# Patient Record
Sex: Male | Born: 1935 | Race: White | Hispanic: No | Marital: Married | Smoking: Never smoker
Health system: Southern US, Community
[De-identification: ages and names within clinical notes are randomized; demographics above are authoritative.]

## PROBLEM LIST (undated history)

## (undated) DIAGNOSIS — N200 Calculus of kidney: Secondary | ICD-10-CM

## (undated) DIAGNOSIS — E782 Mixed hyperlipidemia: Secondary | ICD-10-CM

## (undated) DIAGNOSIS — N400 Enlarged prostate without lower urinary tract symptoms: Secondary | ICD-10-CM

## (undated) DIAGNOSIS — I2510 Atherosclerotic heart disease of native coronary artery without angina pectoris: Secondary | ICD-10-CM

## (undated) DIAGNOSIS — R911 Solitary pulmonary nodule: Secondary | ICD-10-CM

## (undated) DIAGNOSIS — Z87442 Personal history of urinary calculi: Secondary | ICD-10-CM

## (undated) DIAGNOSIS — J309 Allergic rhinitis, unspecified: Secondary | ICD-10-CM

## (undated) DIAGNOSIS — K219 Gastro-esophageal reflux disease without esophagitis: Secondary | ICD-10-CM

## (undated) DIAGNOSIS — C4490 Unspecified malignant neoplasm of skin, unspecified: Secondary | ICD-10-CM

## (undated) DIAGNOSIS — I10 Essential (primary) hypertension: Secondary | ICD-10-CM

## (undated) DIAGNOSIS — D649 Anemia, unspecified: Secondary | ICD-10-CM

## (undated) HISTORY — DX: Allergic rhinitis, unspecified: J30.9

## (undated) HISTORY — PX: BACK SURGERY: SHX140

## (undated) HISTORY — DX: Mixed hyperlipidemia: E78.2

## (undated) HISTORY — DX: Anemia, unspecified: D64.9

## (undated) HISTORY — PX: TOTAL SHOULDER REPLACEMENT: SUR1217

## (undated) HISTORY — DX: Personal history of urinary calculi: Z87.442

## (undated) HISTORY — DX: Essential (primary) hypertension: I10

## (undated) HISTORY — PX: CORONARY ANGIOPLASTY WITH STENT PLACEMENT: SHX49

## (undated) HISTORY — PX: MOHS SURGERY: SHX181

## (undated) HISTORY — PX: APPENDECTOMY: SHX54

## (undated) HISTORY — DX: Solitary pulmonary nodule: R91.1

## (undated) HISTORY — PX: JOINT REPLACEMENT: SHX530

---

## 1998-09-14 ENCOUNTER — Other Ambulatory Visit: Admission: RE | Admit: 1998-09-14 | Discharge: 1998-09-14 | Payer: Self-pay | Admitting: *Deleted

## 1998-11-25 ENCOUNTER — Emergency Department (HOSPITAL_COMMUNITY): Admission: EM | Admit: 1998-11-25 | Discharge: 1998-11-25 | Payer: Self-pay | Admitting: Endocrinology

## 1998-11-25 ENCOUNTER — Encounter: Payer: Self-pay | Admitting: Endocrinology

## 2001-04-11 ENCOUNTER — Encounter: Admission: RE | Admit: 2001-04-11 | Discharge: 2001-07-10 | Payer: Self-pay | Admitting: *Deleted

## 2009-02-16 ENCOUNTER — Encounter: Admission: RE | Admit: 2009-02-16 | Discharge: 2009-04-08 | Payer: Self-pay | Admitting: Orthopedic Surgery

## 2009-04-11 ENCOUNTER — Encounter: Admission: RE | Admit: 2009-04-11 | Discharge: 2009-05-25 | Payer: Self-pay | Admitting: Orthopedic Surgery

## 2009-05-24 ENCOUNTER — Encounter: Admission: RE | Admit: 2009-05-24 | Discharge: 2009-05-24 | Payer: Self-pay | Admitting: Orthopedic Surgery

## 2009-09-04 ENCOUNTER — Emergency Department (HOSPITAL_BASED_OUTPATIENT_CLINIC_OR_DEPARTMENT_OTHER): Admission: EM | Admit: 2009-09-04 | Discharge: 2009-09-04 | Payer: Self-pay | Admitting: Emergency Medicine

## 2009-09-04 ENCOUNTER — Encounter: Payer: Self-pay | Admitting: Pulmonary Disease

## 2009-09-04 ENCOUNTER — Ambulatory Visit: Payer: Self-pay | Admitting: Diagnostic Radiology

## 2009-09-08 ENCOUNTER — Ambulatory Visit (HOSPITAL_COMMUNITY): Admission: RE | Admit: 2009-09-08 | Discharge: 2009-09-08 | Payer: Self-pay | Admitting: Urology

## 2009-10-17 ENCOUNTER — Ambulatory Visit: Payer: Self-pay | Admitting: Pulmonary Disease

## 2009-10-17 DIAGNOSIS — E119 Type 2 diabetes mellitus without complications: Secondary | ICD-10-CM | POA: Insufficient documentation

## 2009-10-17 DIAGNOSIS — J984 Other disorders of lung: Secondary | ICD-10-CM | POA: Insufficient documentation

## 2009-10-17 DIAGNOSIS — J309 Allergic rhinitis, unspecified: Secondary | ICD-10-CM | POA: Insufficient documentation

## 2009-10-20 ENCOUNTER — Ambulatory Visit: Payer: Self-pay | Admitting: Internal Medicine

## 2009-10-20 ENCOUNTER — Encounter: Payer: Self-pay | Admitting: Pulmonary Disease

## 2010-03-02 ENCOUNTER — Observation Stay (HOSPITAL_COMMUNITY): Admission: RE | Admit: 2010-03-02 | Discharge: 2010-03-03 | Payer: Self-pay | Admitting: Orthopedic Surgery

## 2010-07-11 NOTE — Assessment & Plan Note (Signed)
Summary: consult for pulmonary nodule.   Primary Provider/Referring Provider:  Marinda Garza  CC:  Pulmonary Consult.  History of Present Illness: the pt is a 77y/o male who comes in today for evaluation of a pulmonary nodule.  He recently underwent a ct a/p for flank pain, and incidentally was found to have a 5mm RLL nodule on the cuts thru the lung bases.  The pt has never smoked, and denies unexplained weight loss and anorexia.  He denies all pulmonary symptoms.  He has never lived in [place] or [place], and has no h/o TB exposure or +ppd.  He has no personal h/o cancer other than nonmelanoma skin cancer, and his health maintenance is up to date by his account.  He has not had a full chest ct.  Preventive Screening-Counseling & Management  Alcohol-Tobacco     Smoking Status: never  Current Medications (verified): 1)  Metformin Hcl 500 Mg Tabs (Metformin Hcl) .... Two Times A Day 2)  Glimepiride 4 Mg Tabs (Glimepiride) .... Take 1 Tablet By Mouth Once A Day 3)  Crestor 10 Mg Tabs (Rosuvastatin Calcium) .... Take 2 Tabs By Mouth Per Week 4)  Prilosec 20 Mg Cpdr (Omeprazole) .... Take 1 Tablet By Mouth Once A Day  Allergies (verified): 1)  ! Penicillin  Past History:  Past Medical History:   ALLERGIC RHINITIS (ICD-477.9) DM (ICD-250.00)    Past Surgical History: sinus surgery 1980s appendectomy 1980s R shoulder surgery x 5 . most recently done sept 2010 and dec 2010 lithotripsy d/t kidney stone april 2011  Family History: Reviewed history and no changes required. heart disease: mother (CHF), father (m.i.), brother   Social History: Reviewed history and no changes required. Patient never smoked.  pt is married. pt has children. pt is a retired [employer]. Smoking Status:  never  Review of Systems       The patient complains of nasal congestion/difficulty breathing through nose.  The patient denies shortness of breath with activity, shortness of breath at rest,  productive cough, non-productive cough, coughing up blood, chest pain, irregular heartbeats, acid heartburn, indigestion, loss of appetite, weight change, abdominal pain, difficulty swallowing, sore throat, tooth/dental problems, headaches, sneezing, itching, ear ache, anxiety, depression, hand/feet swelling, joint stiffness or pain, rash, change in color of mucus, and fever.    Vital Signs:  Patient profile:   77 year old male Height:      66 inches Weight:      159 pounds BMI:     25.76 O2 Sat:      96 % on Room air Temp:     98.2 degrees F Pulse rate:   78 / minute BP sitting:   126 / 78  (left arm) Cuff size:   regular  Vitals Entered By: Arman Filter LPN (Oct 17, 1608 3:00 PM)  O2 Flow:  Room air  Physical Exam  General:  wd male in nad Eyes:  PERRLA and EOMI.   Nose:  patent without discharge Mouth:  clear  Neck:  no jvd, tmg, LN Lungs:  totally clear to auscultation Heart:  rrr, no mrg Abdomen:  soft and nontender, bs+ Extremities:  no edema or cyanosis pulses intact distally Neurologic:  alert and oriented, moves all 4.   Impression & Recommendations:  Problem # 1:  PULMONARY NODULE, RIGHT LOWER LOBE (ICD-518.89) the pt has a 5mm nodule in his RLL that was found incidentally on ct abdomen/pelvis.  He is very low risk as a never smoker, and  his health maintenance is up to date.  Surveillance is most appropriate at this point, and will need a followup in 12mos.  He does need a full ct chest to make sure there are not multiples, or other pathology at baseline. I have discussed all of this with the pt, and he is agreeable to this approach.  Medications Added to Medication List This Visit: 1)  Metformin Hcl 500 Mg Tabs (Metformin hcl) .... Two times a day 2)  Glimepiride 4 Mg Tabs (Glimepiride) .... Take 1 tablet by mouth once a day 3)  Crestor 10 Mg Tabs (Rosuvastatin calcium) .... Take 2 tabs by mouth per week 4)  Prilosec 20 Mg Cpdr (Omeprazole) .... Take 1 tablet by  mouth once a day  Other Orders: Consultation Level IV (65784) Radiology Referral (Radiology)  Patient Instructions: 1)  will set up for ct chest, and let you know results.  If everything looks good, will do followup in one year.

## 2010-07-11 NOTE — Miscellaneous (Signed)
Summary: Orders Update   Clinical Lists Changes  Orders: Added new Referral order of Radiology Referral (Radiology) - Signed 

## 2010-08-24 LAB — URINALYSIS, ROUTINE W REFLEX MICROSCOPIC
Bilirubin Urine: NEGATIVE
Glucose, UA: >1000 mg/dL — AB
Hgb urine dipstick: NEGATIVE
Specific Gravity, Urine: 1.03 (ref 1.005–1.030)
pH: 5 (ref 5.0–8.0)

## 2010-08-24 LAB — CBC
Hemoglobin: 13.9 g/dL (ref 13.0–17.0)
MCV: 91.2 fL (ref 78.0–100.0)
Platelets: 177 10*3/uL (ref 150–400)
RBC: 4.3 MIL/uL (ref 4.22–5.81)
WBC: 8.9 10*3/uL (ref 4.0–10.5)

## 2010-08-24 LAB — URINE MICROSCOPIC-ADD ON

## 2010-08-24 LAB — COMPREHENSIVE METABOLIC PANEL
AST: 39 U/L — ABNORMAL HIGH (ref 0–37)
Albumin: 4.2 g/dL (ref 3.5–5.2)
Alkaline Phosphatase: 72 U/L (ref 39–117)
Chloride: 105 mEq/L (ref 96–112)
GFR calc Af Amer: >60 mL/min (ref >60)
Potassium: 4.9 mEq/L (ref 3.5–5.1)
Total Bilirubin: 1 mg/dL (ref 0.3–1.2)

## 2010-08-24 LAB — GLUCOSE, CAPILLARY
Glucose-Capillary: 166 mg/dL — ABNORMAL HIGH (ref 70–99)
Glucose-Capillary: 266 mg/dL — ABNORMAL HIGH (ref 70–99)

## 2010-08-24 LAB — SURGICAL PCR SCREEN: MRSA, PCR: NEGATIVE

## 2010-09-04 LAB — COMPREHENSIVE METABOLIC PANEL
Albumin: 4.1 g/dL (ref 3.5–5.2)
BUN: 24 mg/dL — ABNORMAL HIGH (ref 6–23)
Calcium: 9.4 mg/dL (ref 8.4–10.5)
Creatinine, Ser: 1.9 mg/dL — ABNORMAL HIGH (ref 0.4–1.5)
Potassium: 4 mEq/L (ref 3.5–5.1)
Total Protein: 7.8 g/dL (ref 6.0–8.3)

## 2010-09-04 LAB — URINALYSIS, ROUTINE W REFLEX MICROSCOPIC
Glucose, UA: NEGATIVE mg/dL
Hgb urine dipstick: NEGATIVE
Specific Gravity, Urine: 1.024 (ref 1.005–1.030)
Urobilinogen, UA: 0.2 mg/dL (ref 0.0–1.0)

## 2010-09-04 LAB — CBC
HCT: 39 % (ref 39.0–52.0)
Platelets: 221 10*3/uL (ref 150–400)
RDW: 13 % (ref 11.5–15.5)

## 2010-09-04 LAB — DIFFERENTIAL
Lymphocytes Relative: 18 % (ref 12–46)
Lymphs Abs: 2.2 10*3/uL (ref 0.7–4.0)
Monocytes Absolute: 1.2 10*3/uL — ABNORMAL HIGH (ref 0.1–1.0)
Monocytes Relative: 10 % (ref 3–12)
Neutro Abs: 9 10*3/uL — ABNORMAL HIGH (ref 1.7–7.7)

## 2010-09-04 LAB — URINE CULTURE

## 2010-09-04 LAB — GLUCOSE, CAPILLARY

## 2010-12-12 ENCOUNTER — Telehealth: Payer: Self-pay | Admitting: *Deleted

## 2010-12-12 DIAGNOSIS — R911 Solitary pulmonary nodule: Secondary | ICD-10-CM

## 2010-12-12 NOTE — Telephone Encounter (Signed)
Message copied by Salli Quarry on Tue Dec 12, 2010  5:24 PM ------      Message from: Salli Quarry      Created: Mon Sep 11, 2010  9:23 AM       F/u ct chest to eval nodules.

## 2010-12-21 ENCOUNTER — Telehealth: Payer: Self-pay | Admitting: Pulmonary Disease

## 2010-12-21 NOTE — Telephone Encounter (Signed)
Called and spoke with Rose at [facility] who informed me ok for pt to have CT of chest.  (pt recently had shoulder replacement surgery Sept 2011 and wanted to make sure it was ok to have CT)  Called and spoke with pt's spouse and informed her ok for pt to have CT. Nothing further needed.

## 2010-12-26 ENCOUNTER — Ambulatory Visit (INDEPENDENT_AMBULATORY_CARE_PROVIDER_SITE_OTHER)
Admission: RE | Admit: 2010-12-26 | Discharge: 2010-12-26 | Disposition: A | Discharge status: Home / Self Care | Payer: Medicare Other | Source: Ambulatory Visit | Attending: Pulmonary Disease | Admitting: Pulmonary Disease

## 2010-12-26 DIAGNOSIS — J984 Other disorders of lung: Secondary | ICD-10-CM

## 2010-12-26 DIAGNOSIS — R911 Solitary pulmonary nodule: Secondary | ICD-10-CM

## 2011-02-25 ENCOUNTER — Emergency Department (HOSPITAL_BASED_OUTPATIENT_CLINIC_OR_DEPARTMENT_OTHER)
Admission: EM | Admit: 2011-02-25 | Discharge: 2011-02-25 | Disposition: A | Discharge status: Home / Self Care | Payer: Medicare Other | Attending: Emergency Medicine | Admitting: Emergency Medicine

## 2011-02-25 ENCOUNTER — Emergency Department (INDEPENDENT_AMBULATORY_CARE_PROVIDER_SITE_OTHER): Payer: Medicare Other

## 2011-02-25 ENCOUNTER — Encounter: Payer: Self-pay | Admitting: Emergency Medicine

## 2011-02-25 DIAGNOSIS — N400 Enlarged prostate without lower urinary tract symptoms: Secondary | ICD-10-CM

## 2011-02-25 DIAGNOSIS — E119 Type 2 diabetes mellitus without complications: Secondary | ICD-10-CM | POA: Insufficient documentation

## 2011-02-25 DIAGNOSIS — K5730 Diverticulosis of large intestine without perforation or abscess without bleeding: Secondary | ICD-10-CM

## 2011-02-25 DIAGNOSIS — M545 Low back pain, unspecified: Secondary | ICD-10-CM

## 2011-02-25 LAB — URINALYSIS, ROUTINE W REFLEX MICROSCOPIC
Hgb urine dipstick: NEGATIVE
Leukocytes, UA: NEGATIVE
Nitrite: NEGATIVE
Protein, ur: NEGATIVE mg/dL
Specific Gravity, Urine: 1.031 — ABNORMAL HIGH (ref 1.005–1.030)
Urobilinogen, UA: 0.2 mg/dL (ref 0.0–1.0)

## 2011-02-25 LAB — GLUCOSE, CAPILLARY: Glucose-Capillary: 61 mg/dL — ABNORMAL LOW (ref 70–99)

## 2011-02-25 MED ORDER — CYCLOBENZAPRINE HCL 10 MG PO TABS: 10.0000 mg | ORAL_TABLET | Freq: Two times a day (BID) | ORAL | Status: AC | PRN

## 2011-02-25 MED ORDER — OXYCODONE-ACETAMINOPHEN 5-325 MG PO TABS: 2.0000 | ORAL_TABLET | ORAL | Status: AC | PRN

## 2011-02-25 MED ORDER — OXYCODONE-ACETAMINOPHEN 5-325 MG PO TABS
1.0000 | ORAL_TABLET | Freq: Once | ORAL | Status: AC
  Administered 2011-02-25: 1 via ORAL
  Filled 2011-02-25: qty 1

## 2011-02-25 MED ORDER — OXYCODONE-ACETAMINOPHEN 5-325 MG PO TABS
2.0000 | ORAL_TABLET | Freq: Once | ORAL | Status: AC
  Administered 2011-02-25: 2 via ORAL
  Filled 2011-02-25: qty 2

## 2011-02-25 NOTE — ED Provider Notes (Signed)
History     CSN: [id number] Arrival date & time: 02/25/2011  9:52 AM   Chief Complaint  Patient presents with  . Back Pain    lower back pain with radiation to L leg     (Include location/radiation/quality/duration/timing/severity/associated sxs/prior treatment) HPI Back pain for 10 days.  Pain started abruptly.  Sharp and increases with movement.  No numbness or tingling.  Patient was outside working on riding a riding lawnmower when the pain began. Pain has been intermittently sharp her for up to a minute. It does increase with movement. He has not had any loss of strength in any extremities.  Past Medical History  Diagnosis Date  . Diabetes mellitus    Reviewed  Past Surgical History  Procedure Date  . Total shoulder replacement    reviewed  No family history on file.  History  Substance Use Topics  . Smoking status: Never Smoker   . Smokeless tobacco: Not on file  . Alcohol Use: No      Review of Systems  All other systems reviewed and are negative.    Allergies  Penicillins  Home Medications   Current Outpatient Rx  Name Route Sig Dispense Refill  . DOXAZOSIN MESYLATE 8 MG PO TABS Oral Take 8 mg by mouth at bedtime.      Marland Kitchen GLIMEPIRIDE 4 MG PO TABS Oral Take 10 mg by mouth daily before breakfast.      . METFORMIN HCL 500 MG (MOD) PO TB24 Oral Take 500 mg by mouth daily with breakfast.      . PREGABALIN 75 MG PO CAPS Oral Take 75 mg by mouth 2 (two) times daily.        Physical Exam    BP 114/65  Pulse 77  Temp(Src) 98 F (36.7 C) (Oral)  Resp 19  SpO2 99%  Physical Exam  Vitals reviewed.  well-developed well-nourished male sitting on the bed appears slightly uncomfortable. HEENT normocephalic atraumatic eyes pupils equal round react to light extraocular movements are intact Neck range of motion is normal the supple Cardiovascular regular rate and rhythm normal heart sounds Pulmonary respiratory rate is normal respiratory effort is normal  breath sounds are equal throughout Abdomen is soft and nontender Back there is mild tenderness to palpation in the left lumbar region and to the lateral aspect of the lumbar spine on the left. Musculoskeletal extremities are normal with equal pulses throughout. Full active range of movement of bilateral lower extremities. Neurologically he is alert oriented x3 with deep tendon reflexes equal throughout there is no strength deficit noted. Skin is warm and dry Psych mood and affect are normal  ED Course  Procedures  Results for orders placed during the hospital encounter of 03/02/10  CBC      Component Value Range   WBC 8.9  4.0 - 10.5 (K/uL)   RBC 4.30  4.22 - 5.81 (MIL/uL)   Hemoglobin 13.9  13.0 - 17.0 (g/dL)   HCT 40.9  81.1 - 91.4 (%)   MCV 91.2  78.0 - 100.0 (fL)   MCH 32.3  26.0 - 34.0 (pg)   MCHC 35.5  30.0 - 36.0 (g/dL)   RDW 78.2  95.6 - 21.3 (%)   Platelets 177  150 - 400 (K/uL)  COMPREHENSIVE METABOLIC PANEL      Component Value Range   Sodium 139  135 - 145 (mEq/L)   Potassium 4.9  3.5 - 5.1 (mEq/L)   Chloride 105  96 - 112 (mEq/L)  CO2 28  19 - 32 (mEq/L)   Glucose, Bld 247 (*) 70 - 99 (mg/dL)   BUN 16  6 - 23 (mg/dL)   Creatinine, Ser 4.09  0.4 - 1.5 (mg/dL)   Calcium 9.9  8.4 - 81.1 (mg/dL)   Total Protein 7.1  6.0 - 8.3 (g/dL)   Albumin 4.2  3.5 - 5.2 (g/dL)   AST 39 (*) 0 - 37 (U/L)   ALT 37  0 - 53 (U/L)   Alkaline Phosphatase 72  39 - 117 (U/L)   Total Bilirubin 1.0  0.3 - 1.2 (mg/dL)   GFR calc non Af Amer >60  >60 (mL/min)   GFR calc Af Amer    >60 (mL/min)   Value: >60            The eGFR has been calculated     using the MDRD equation.     This calculation has not been     validated in all clinical     situations.     eGFR's persistently     <60 mL/min signify     possible Chronic Kidney Disease.  PROTIME-INR      Component Value Range   Prothrombin Time 13.8  11.6 - 15.2 (seconds)   INR 1.04  0.00 - 1.49   APTT      Component Value Range     aPTT 28  24 - 37 (seconds)  URINALYSIS, ROUTINE W REFLEX MICROSCOPIC      Component Value Range   Color, Urine YELLOW  YELLOW    Appearance CLEAR  CLEAR    Specific Gravity, Urine 1.030  1.005 - 1.030    pH 5.0  5.0 - 8.0    Glucose, UA >1000 (*) NEGATIVE (mg/dL)   Hgb urine dipstick NEGATIVE  NEGATIVE    Bilirubin Urine NEGATIVE  NEGATIVE    Ketones, ur 15 (*) NEGATIVE (mg/dL)   Protein, ur NEGATIVE  NEGATIVE (mg/dL)   Urobilinogen, UA 0.2  0.0 - 1.0 (mg/dL)   Nitrite NEGATIVE  NEGATIVE    Leukocytes, UA NEGATIVE  NEGATIVE   URINE MICROSCOPIC-ADD ON      Component Value Range   Squamous Epithelial / LPF RARE  RARE    Casts HYALINE CASTS (*) NEGATIVE    Urine-Other MUCOUS PRESENT    SURGICAL PCR SCREEN      Component Value Range   MRSA, PCR NEGATIVE  NEGATIVE    Staphylococcus aureus   (*) NEGATIVE    Value: POSITIVE            The Xpert SA Assay (FDA     approved for NASAL specimens     only), is one component of     a comprehensive surveillance     program.  It is not intended     to diagnose infection nor to     guide or monitor treatment.  GLUCOSE, CAPILLARY      Component Value Range   Glucose-Capillary 166 (*) 70 - 99 (mg/dL)  GLUCOSE, CAPILLARY      Component Value Range   Glucose-Capillary 221 (*) 70 - 99 (mg/dL)  GLUCOSE, CAPILLARY      Component Value Range   Glucose-Capillary 297 (*) 70 - 99 (mg/dL)   Comment 1 Notify RN    GLUCOSE, CAPILLARY      Component Value Range   Glucose-Capillary 266 (*) 70 - 99 (mg/dL)  GLUCOSE, CAPILLARY      Component Value Range   Glucose-Capillary 196 (*)  70 - 99 (mg/dL)   No results found.   No diagnosis found.   MDM Ct Abdomen Pelvis Wo Contrast  02/25/2011  *RADIOLOGY REPORT*  Clinical Data: Lower back pain.  History of kidney stones.  No prior surgery.  CT ABDOMEN AND PELVIS WITHOUT CONTRAST  Technique:  Multidetector CT imaging of the abdomen and pelvis was performed following the standard protocol without  intravenous contrast.  Comparison: 09/04/2009  Findings: The lung bases are unremarkable. Mild cardiac enlargement.  Coronary artery calcifications are noted.  Intrarenal calculus is identified in the upper pole of the right kidney, measuring 2 mm.  No ureteral calculi are identified.  The left kidney has a normal appearance.  There are scattered, calcified granulomata within the liver.  No focal abnormality identified within the spleen, pancreas, adrenal glands.  The gallbladder is present. Stomach and visualized bowel loops are normal in caliber and wall thickness.  There are numerous colonic diverticula.  No CT evidence for acute diverticulitis.  The prostate is mildly enlarged.  No free pelvic fluid. No retroperitoneal or mesenteric adenopathy. No evidence for aortic aneurysm.  IMPRESSION:  1.  No evidence for obstructing ureteral calculi. 2.  No evidence for bowel obstruction or inflammatory process. 3.  Diverticulosis without evidence for acute diverticulitis. 4.  Prostatic enlargement.  Original Report Authenticated By: Patterson Hammersmith, M.D.   Results for orders placed during the hospital encounter of 02/25/11  URINALYSIS, ROUTINE W REFLEX MICROSCOPIC      Component Value Range   Color, Urine YELLOW  YELLOW    Appearance CLEAR  CLEAR    Specific Gravity, Urine 1.031 (*) 1.005 - 1.030    pH 5.0  5.0 - 8.0    Glucose, UA 500 (*) NEGATIVE (mg/dL)   Hgb urine dipstick NEGATIVE  NEGATIVE    Bilirubin Urine NEGATIVE  NEGATIVE    Ketones, ur 15 (*) NEGATIVE (mg/dL)   Protein, ur NEGATIVE  NEGATIVE (mg/dL)   Urobilinogen, UA 0.2  0.0 - 1.0 (mg/dL)   Nitrite NEGATIVE  NEGATIVE    Leukocytes, UA NEGATIVE  NEGATIVE   GLUCOSE, CAPILLARY      Component Value Range   Glucose-Capillary 61 (*) 70 - 99 (mg/dL)   Comment 1 Notify RN     Comment 2 Documented in Chart          Patient is instructed regarding symptoms to return for. He is instructed to follow up with primary care doctor this week.  He is instructed to return if he has any numbness tingling weakness or loss of bowel or bladder control. He'll be treated with pain medicines anti-inflammatories and given instructions regarding activity.  Hilario Quarry, MD 02/25/11 [phone]

## 2011-02-25 NOTE — ED Notes (Signed)
Patient given orange juice and crackers per assigned RN verbal order. Assigned RN aware of patients CBG

## 2011-02-25 NOTE — ED Notes (Signed)
Pt presents with Lower L sided back and leg pain after yard work

## 2011-02-25 NOTE — ED Notes (Signed)
rx x 2 given at d/c- family present to drive

## 2011-03-13 ENCOUNTER — Other Ambulatory Visit (HOSPITAL_BASED_OUTPATIENT_CLINIC_OR_DEPARTMENT_OTHER): Payer: Self-pay | Admitting: Family Medicine

## 2011-03-13 ENCOUNTER — Ambulatory Visit (HOSPITAL_BASED_OUTPATIENT_CLINIC_OR_DEPARTMENT_OTHER)
Admission: RE | Admit: 2011-03-13 | Discharge: 2011-03-13 | Disposition: A | Discharge status: Home / Self Care | Payer: Medicare Other | Source: Ambulatory Visit | Attending: Family Medicine | Admitting: Family Medicine

## 2011-03-13 DIAGNOSIS — M549 Dorsalgia, unspecified: Secondary | ICD-10-CM | POA: Insufficient documentation

## 2011-03-13 DIAGNOSIS — M51379 Other intervertebral disc degeneration, lumbosacral region without mention of lumbar back pain or lower extremity pain: Secondary | ICD-10-CM | POA: Insufficient documentation

## 2011-03-13 DIAGNOSIS — R209 Unspecified disturbances of skin sensation: Secondary | ICD-10-CM

## 2011-03-13 DIAGNOSIS — M5137 Other intervertebral disc degeneration, lumbosacral region: Secondary | ICD-10-CM | POA: Insufficient documentation

## 2011-03-13 DIAGNOSIS — M47817 Spondylosis without myelopathy or radiculopathy, lumbosacral region: Secondary | ICD-10-CM

## 2011-03-13 DIAGNOSIS — M545 Low back pain, unspecified: Secondary | ICD-10-CM

## 2011-09-17 ENCOUNTER — Other Ambulatory Visit (HOSPITAL_BASED_OUTPATIENT_CLINIC_OR_DEPARTMENT_OTHER): Payer: Self-pay | Admitting: Family Medicine

## 2011-09-17 DIAGNOSIS — K765 Hepatic veno-occlusive disease: Secondary | ICD-10-CM

## 2011-09-18 ENCOUNTER — Other Ambulatory Visit (HOSPITAL_BASED_OUTPATIENT_CLINIC_OR_DEPARTMENT_OTHER): Payer: Self-pay | Admitting: Family Medicine

## 2011-09-18 ENCOUNTER — Ambulatory Visit (HOSPITAL_BASED_OUTPATIENT_CLINIC_OR_DEPARTMENT_OTHER)
Admission: RE | Admit: 2011-09-18 | Discharge: 2011-09-18 | Disposition: A | Discharge status: Home / Self Care | Payer: Medicare Other | Source: Ambulatory Visit | Attending: Family Medicine | Admitting: Family Medicine

## 2011-09-18 DIAGNOSIS — R6884 Jaw pain: Secondary | ICD-10-CM | POA: Insufficient documentation

## 2011-09-18 DIAGNOSIS — K765 Hepatic veno-occlusive disease: Secondary | ICD-10-CM

## 2011-09-18 DIAGNOSIS — R42 Dizziness and giddiness: Secondary | ICD-10-CM

## 2011-09-18 DIAGNOSIS — R51 Headache: Secondary | ICD-10-CM

## 2011-09-18 DIAGNOSIS — J3489 Other specified disorders of nose and nasal sinuses: Secondary | ICD-10-CM

## 2011-11-13 ENCOUNTER — Other Ambulatory Visit: Payer: Self-pay | Admitting: Neurosurgery

## 2011-11-13 DIAGNOSIS — M545 Low back pain, unspecified: Secondary | ICD-10-CM

## 2011-11-15 ENCOUNTER — Ambulatory Visit
Admission: RE | Admit: 2011-11-15 | Discharge: 2011-11-15 | Disposition: A | Discharge status: Home / Self Care | Payer: Medicare Other | Source: Ambulatory Visit | Attending: Neurosurgery | Admitting: Neurosurgery

## 2011-11-15 DIAGNOSIS — M545 Low back pain, unspecified: Secondary | ICD-10-CM

## 2011-11-28 ENCOUNTER — Other Ambulatory Visit: Payer: Self-pay | Admitting: Neurosurgery

## 2011-11-29 ENCOUNTER — Encounter (HOSPITAL_COMMUNITY): Payer: Self-pay | Admitting: Pharmacy Technician

## 2011-11-30 ENCOUNTER — Encounter (HOSPITAL_COMMUNITY)
Admission: RE | Admit: 2011-11-30 | Discharge: 2011-11-30 | Disposition: A | Discharge status: Home / Self Care | Payer: Medicare Other | Source: Ambulatory Visit | Attending: Neurosurgery | Admitting: Neurosurgery

## 2011-11-30 ENCOUNTER — Encounter (HOSPITAL_COMMUNITY): Payer: Self-pay

## 2011-11-30 HISTORY — DX: Benign prostatic hyperplasia without lower urinary tract symptoms: N40.0

## 2011-11-30 HISTORY — DX: Gastro-esophageal reflux disease without esophagitis: K21.9

## 2011-11-30 LAB — BASIC METABOLIC PANEL
BUN: 18 mg/dL (ref 6–23)
CO2: 26 mEq/L (ref 19–32)
GFR calc non Af Amer: 81 mL/min — ABNORMAL LOW (ref >90)
Glucose, Bld: 144 mg/dL — ABNORMAL HIGH (ref 70–99)
Potassium: 4.5 mEq/L (ref 3.5–5.1)

## 2011-11-30 LAB — CBC
Hemoglobin: 12 g/dL — ABNORMAL LOW (ref 13.0–17.0)
MCH: 29.7 pg (ref 26.0–34.0)
MCHC: 34 g/dL (ref 30.0–36.0)
MCV: 87.4 fL (ref 78.0–100.0)

## 2011-11-30 LAB — TYPE AND SCREEN: ABO/RH(D): B POS

## 2011-11-30 NOTE — Pre-Procedure Instructions (Signed)
20 KARRY CAUSER  11/30/2011   Your procedure is scheduled on:  12/04/11  Report to [facility] at 530 AM.  Call this number if you have problems the morning of surgery: [phone]   Remember:   Do not eat food   Or drink  After   midnight  Take these medicines the morning of surgery with A SIP OF WATER: cardura,prilosec,   Do not wear jewelry, make-up or nail polish.  Do not wear lotions, powders, or perfumes. You may wear deodorant.  Do not shave 48 hours prior to surgery. Men may shave face and neck.  Do not bring valuables to the hospital.  Contacts, dentures or bridgework may not be worn into surgery.  Leave suitcase in the car. After surgery it may be brought to your room.  For patients admitted to the hospital, checkout time is 11:00 AM the day of discharge.   Patients discharged the day of surgery will not be allowed to drive home.  Name and phone number of your driver: *family**  Special Instructions: CHG Shower Use Special Wash: 1/2 bottle night before surgery and 1/2 bottle morning of surgery.   Please read over the following fact sheets that you were given: Pain Booklet, Coughing and Deep Breathing, Blood Transfusion Information, MRSA Information and Surgical Site Infection Prevention

## 2011-11-30 NOTE — Progress Notes (Signed)
Dr frieds office did not find any old ekgs or cardiac records

## 2011-11-30 NOTE — Consult Note (Signed)
Anesthesia Chart Review:  Patient is a 77 year old male posted for L4-5 PLIF on 12/05/11 by Dr. Blanche East.  History includes DM2, non-smoker, BPH (on doxazosin), GERD, HLD (on rosuvastatin), total shoulder replacement 02/2010, appendectomy.  PCP is Dr. Marinda Elk with [facility].    Labs noted.  Cr 0.88, glucose 144.  His A1C on 10/10/11 ([facility]) was 7.7, and his DM medication regimen was increased.  H/H 12.0/35.3.    He did not meet Anesthesia's criteria for pre-operative CXR.  EKG on 11/30/11 showed NSR, right BBB, LAFB, cannot rule out inferior infarct (age undetermined).  No CV symptoms were documented at his PAT visit. I think his EKG appears stable since 2011.  I reviewed above with Anesthesiologist Dr. Ivin Booty who agrees if no new CV symptoms then plan to proceed.  Shonna Chock, PA-C

## 2011-11-30 NOTE — Progress Notes (Signed)
Dr fried called for past ekgs

## 2011-11-30 NOTE — Progress Notes (Signed)
Dr freid   In [facility]  Family paractice.  No current cardiac studies

## 2011-12-04 ENCOUNTER — Encounter (HOSPITAL_COMMUNITY): Payer: Self-pay

## 2011-12-04 ENCOUNTER — Ambulatory Visit (HOSPITAL_COMMUNITY): Payer: Medicare Other

## 2011-12-04 ENCOUNTER — Encounter (HOSPITAL_COMMUNITY)
Admission: RE | Disposition: A | Discharge status: Home Health | Payer: Self-pay | Source: Ambulatory Visit | Attending: Neurosurgery

## 2011-12-04 ENCOUNTER — Ambulatory Visit (HOSPITAL_COMMUNITY): Payer: Medicare Other | Admitting: Vascular Surgery

## 2011-12-04 ENCOUNTER — Encounter (HOSPITAL_COMMUNITY): Payer: Self-pay | Admitting: Vascular Surgery

## 2011-12-04 ENCOUNTER — Inpatient Hospital Stay (HOSPITAL_COMMUNITY)
Admission: RE | Admit: 2011-12-04 | Discharge: 2011-12-07 | DRG: 460 | Disposition: A | Discharge status: Home Health | Payer: Medicare Other | Source: Ambulatory Visit | Attending: Neurosurgery | Admitting: Neurosurgery

## 2011-12-04 DIAGNOSIS — M5106 Intervertebral disc disorders with myelopathy, lumbar region: Secondary | ICD-10-CM | POA: Diagnosis present

## 2011-12-04 DIAGNOSIS — E119 Type 2 diabetes mellitus without complications: Secondary | ICD-10-CM | POA: Diagnosis present

## 2011-12-04 DIAGNOSIS — K219 Gastro-esophageal reflux disease without esophagitis: Secondary | ICD-10-CM | POA: Diagnosis present

## 2011-12-04 DIAGNOSIS — Z01812 Encounter for preprocedural laboratory examination: Secondary | ICD-10-CM

## 2011-12-04 DIAGNOSIS — Z01810 Encounter for preprocedural cardiovascular examination: Secondary | ICD-10-CM

## 2011-12-04 DIAGNOSIS — Q762 Congenital spondylolisthesis: Principal | ICD-10-CM

## 2011-12-04 LAB — URINALYSIS, ROUTINE W REFLEX MICROSCOPIC
Nitrite: NEGATIVE
Specific Gravity, Urine: 1.031 — ABNORMAL HIGH (ref 1.005–1.030)
Urobilinogen, UA: 0.2 mg/dL (ref 0.0–1.0)
pH: 5.5 (ref 5.0–8.0)

## 2011-12-04 LAB — CBC
Hemoglobin: 11.4 g/dL — ABNORMAL LOW (ref 13.0–17.0)
MCH: 29.5 pg (ref 26.0–34.0)
MCV: 88.3 fL (ref 78.0–100.0)
Platelets: 198 10*3/uL (ref 150–400)
RBC: 3.86 MIL/uL — ABNORMAL LOW (ref 4.22–5.81)
WBC: 9.1 10*3/uL (ref 4.0–10.5)

## 2011-12-04 LAB — PROTIME-INR
INR: 0.98 (ref 0.00–1.49)
Prothrombin Time: 13.2 seconds (ref 11.6–15.2)

## 2011-12-04 LAB — DIFFERENTIAL
Eosinophils Absolute: 0.3 10*3/uL (ref 0.0–0.7)
Eosinophils Relative: 3 % (ref 0–5)
Lymphocytes Relative: 25 % (ref 12–46)
Lymphs Abs: 2.3 10*3/uL (ref 0.7–4.0)
Monocytes Relative: 13 % — ABNORMAL HIGH (ref 3–12)

## 2011-12-04 LAB — GLUCOSE, CAPILLARY
Glucose-Capillary: 132 mg/dL — ABNORMAL HIGH (ref 70–99)
Glucose-Capillary: 153 mg/dL — ABNORMAL HIGH (ref 70–99)
Glucose-Capillary: 88 mg/dL (ref 70–99)

## 2011-12-04 LAB — BASIC METABOLIC PANEL
CO2: 24 mEq/L (ref 19–32)
Calcium: 9.4 mg/dL (ref 8.4–10.5)
Chloride: 103 mEq/L (ref 96–112)
Glucose, Bld: 103 mg/dL — ABNORMAL HIGH (ref 70–99)
Potassium: 4.1 mEq/L (ref 3.5–5.1)
Sodium: 139 mEq/L (ref 135–145)

## 2011-12-04 LAB — APTT: aPTT: 27 seconds (ref 24–37)

## 2011-12-04 SURGERY — POSTERIOR LUMBAR FUSION 1 LEVEL
Anesthesia: General | Site: Spine Lumbar | Wound class: Clean

## 2011-12-04 MED ORDER — ALBUMIN HUMAN 5 % IV SOLN
INTRAVENOUS | Status: DC | PRN
  Administered 2011-12-04: 10:00:00 via INTRAVENOUS

## 2011-12-04 MED ORDER — INSULIN ASPART 100 UNIT/ML ~~LOC~~ SOLN
0.0000 [IU] | SUBCUTANEOUS | Status: DC
Start: 2011-12-04 — End: 2011-12-04
  Administered 2011-12-04: 3 [IU] via SUBCUTANEOUS

## 2011-12-04 MED ORDER — THROMBIN 20000 UNITS EX KIT
PACK | CUTANEOUS | Status: DC | PRN
  Administered 2011-12-04: 09:00:00 via TOPICAL

## 2011-12-04 MED ORDER — FLEET ENEMA 7-19 GM/118ML RE ENEM
1.0000 | ENEMA | Freq: Once | RECTAL | Status: AC | PRN
  Filled 2011-12-04: qty 1

## 2011-12-04 MED ORDER — LIDOCAINE HCL (CARDIAC) 20 MG/ML IV SOLN
INTRAVENOUS | Status: DC | PRN
  Administered 2011-12-04: 100 mg via INTRAVENOUS

## 2011-12-04 MED ORDER — ONDANSETRON HCL 4 MG/2ML IJ SOLN: 4.0000 mg | INTRAMUSCULAR | Status: DC | PRN

## 2011-12-04 MED ORDER — METFORMIN HCL 500 MG PO TABS
1000.0000 mg | ORAL_TABLET | Freq: Two times a day (BID) | ORAL | Status: DC
Start: 2011-12-04 — End: 2011-12-07
  Administered 2011-12-04 – 2011-12-07 (×6): 1000 mg via ORAL
  Filled 2011-12-04 (×8): qty 2

## 2011-12-04 MED ORDER — 0.9 % SODIUM CHLORIDE (POUR BTL) OPTIME
TOPICAL | Status: DC | PRN
  Administered 2011-12-04: 1000 mL

## 2011-12-04 MED ORDER — ACETAMINOPHEN 325 MG PO TABS: 650.0000 mg | ORAL_TABLET | ORAL | Status: DC | PRN

## 2011-12-04 MED ORDER — PANTOPRAZOLE SODIUM 40 MG PO TBEC
40.0000 mg | DELAYED_RELEASE_TABLET | Freq: Every day | ORAL | Status: DC
  Administered 2011-12-05 – 2011-12-06 (×2): 40 mg via ORAL
  Filled 2011-12-04 (×2): qty 1

## 2011-12-04 MED ORDER — NALOXONE HCL 0.4 MG/ML IJ SOLN: 0.4000 mg | INTRAMUSCULAR | Status: DC | PRN

## 2011-12-04 MED ORDER — PHENYLEPHRINE HCL 10 MG/ML IJ SOLN
10.0000 mg | INTRAMUSCULAR | Status: DC | PRN
  Administered 2011-12-04: 20 ug/min via INTRAVENOUS

## 2011-12-04 MED ORDER — DIPHENHYDRAMINE HCL 12.5 MG/5ML PO ELIX
12.5000 mg | ORAL_SOLUTION | Freq: Four times a day (QID) | ORAL | Status: DC | PRN
  Filled 2011-12-04: qty 5

## 2011-12-04 MED ORDER — ZOLPIDEM TARTRATE 5 MG PO TABS
5.0000 mg | ORAL_TABLET | Freq: Every evening | ORAL | Status: DC | PRN
  Administered 2011-12-04 – 2011-12-06 (×3): 5 mg via ORAL
  Filled 2011-12-04 (×3): qty 1

## 2011-12-04 MED ORDER — KETOROLAC TROMETHAMINE 30 MG/ML IJ SOLN
30.0000 mg | Freq: Four times a day (QID) | INTRAMUSCULAR | Status: AC
  Administered 2011-12-04 – 2011-12-05 (×4): 30 mg via INTRAVENOUS
  Filled 2011-12-04 (×4): qty 1

## 2011-12-04 MED ORDER — MORPHINE SULFATE (PF) 1 MG/ML IV SOLN
INTRAVENOUS | Status: AC
  Administered 2011-12-04: 11:00:00
  Filled 2011-12-04: qty 25

## 2011-12-04 MED ORDER — CO Q 10 10 MG PO CAPS: 10.0000 mg | ORAL_CAPSULE | Freq: Every day | ORAL | Status: DC

## 2011-12-04 MED ORDER — ATORVASTATIN CALCIUM 20 MG PO TABS
20.0000 mg | ORAL_TABLET | Freq: Every day | ORAL | Status: DC
  Administered 2011-12-05 – 2011-12-06 (×2): 20 mg via ORAL
  Filled 2011-12-04 (×4): qty 1

## 2011-12-04 MED ORDER — METHOCARBAMOL 100 MG/ML IJ SOLN
500.0000 mg | Freq: Four times a day (QID) | INTRAVENOUS | Status: DC | PRN
  Filled 2011-12-04: qty 5

## 2011-12-04 MED ORDER — NEOSTIGMINE METHYLSULFATE 1 MG/ML IJ SOLN
INTRAMUSCULAR | Status: DC | PRN
  Administered 2011-12-04: 4 mg via INTRAVENOUS

## 2011-12-04 MED ORDER — PROPOFOL 10 MG/ML IV EMUL
INTRAVENOUS | Status: DC | PRN
  Administered 2011-12-04: 170 mg via INTRAVENOUS

## 2011-12-04 MED ORDER — BISACODYL 10 MG RE SUPP: 10.0000 mg | Freq: Every day | RECTAL | Status: DC | PRN

## 2011-12-04 MED ORDER — BACITRACIN 50000 UNITS IM SOLR
INTRAMUSCULAR | Status: AC
  Filled 2011-12-04: qty 1

## 2011-12-04 MED ORDER — LIDOCAINE-EPINEPHRINE 1 %-1:100000 IJ SOLN
INTRAMUSCULAR | Status: DC | PRN
  Administered 2011-12-04: 20 mL

## 2011-12-04 MED ORDER — SODIUM CHLORIDE 0.9 % IV SOLN
INTRAVENOUS | Status: AC
  Filled 2011-12-04: qty 500

## 2011-12-04 MED ORDER — POTASSIUM CHLORIDE IN NACL 20-0.9 MEQ/L-% IV SOLN
INTRAVENOUS | Status: DC
  Administered 2011-12-04: 15:00:00 via INTRAVENOUS
  Filled 2011-12-04 (×9): qty 1000

## 2011-12-04 MED ORDER — PROMETHAZINE HCL 25 MG/ML IJ SOLN: 12.5000 mg | INTRAMUSCULAR | Status: DC | PRN

## 2011-12-04 MED ORDER — DOCUSATE SODIUM 100 MG PO CAPS
100.0000 mg | ORAL_CAPSULE | Freq: Two times a day (BID) | ORAL | Status: DC
  Administered 2011-12-04 – 2011-12-06 (×4): 100 mg via ORAL
  Filled 2011-12-04 (×5): qty 1

## 2011-12-04 MED ORDER — ONDANSETRON HCL 4 MG/2ML IJ SOLN: 4.0000 mg | Freq: Four times a day (QID) | INTRAMUSCULAR | Status: DC | PRN

## 2011-12-04 MED ORDER — SODIUM CHLORIDE 0.9 % IJ SOLN
3.0000 mL | Freq: Two times a day (BID) | INTRAMUSCULAR | Status: DC
  Administered 2011-12-04 – 2011-12-06 (×5): 3 mL via INTRAVENOUS

## 2011-12-04 MED ORDER — INSULIN ASPART 100 UNIT/ML ~~LOC~~ SOLN: 0.0000 [IU] | Freq: Every day | SUBCUTANEOUS | Status: DC

## 2011-12-04 MED ORDER — HYDROMORPHONE HCL PF 1 MG/ML IJ SOLN
0.2500 mg | INTRAMUSCULAR | Status: DC | PRN
  Administered 2011-12-04 (×2): 0.5 mg via INTRAVENOUS

## 2011-12-04 MED ORDER — VANCOMYCIN HCL IN DEXTROSE 1-5 GM/200ML-% IV SOLN
1000.0000 mg | INTRAVENOUS | Status: AC
  Administered 2011-12-04: 1000 mg via INTRAVENOUS

## 2011-12-04 MED ORDER — MORPHINE SULFATE (PF) 1 MG/ML IV SOLN
INTRAVENOUS | Status: DC
  Administered 2011-12-04: 3 mg via INTRAVENOUS
  Administered 2011-12-04: 5 mg via INTRAVENOUS
  Administered 2011-12-05: 4 mg via INTRAVENOUS
  Administered 2011-12-05: 1 mg via INTRAVENOUS
  Administered 2011-12-05: 3 mg via INTRAVENOUS

## 2011-12-04 MED ORDER — SODIUM CHLORIDE 0.9 % IJ SOLN: 9.0000 mL | INTRAMUSCULAR | Status: DC | PRN

## 2011-12-04 MED ORDER — DOXAZOSIN MESYLATE 8 MG PO TABS
8.0000 mg | ORAL_TABLET | Freq: Every day | ORAL | Status: DC
  Administered 2011-12-04 – 2011-12-06 (×3): 8 mg via ORAL
  Filled 2011-12-04 (×4): qty 1

## 2011-12-04 MED ORDER — DEXTROSE 5 % IV SOLN
INTRAVENOUS | Status: DC | PRN
  Administered 2011-12-04: 08:00:00 via INTRAVENOUS

## 2011-12-04 MED ORDER — ACETAMINOPHEN 650 MG RE SUPP: 650.0000 mg | RECTAL | Status: DC | PRN

## 2011-12-04 MED ORDER — MIDAZOLAM HCL 5 MG/5ML IJ SOLN
INTRAMUSCULAR | Status: DC | PRN
  Administered 2011-12-04 (×2): 1 mg via INTRAVENOUS

## 2011-12-04 MED ORDER — INSULIN ASPART 100 UNIT/ML ~~LOC~~ SOLN
0.0000 [IU] | Freq: Three times a day (TID) | SUBCUTANEOUS | Status: DC
  Administered 2011-12-05 (×2): 3 [IU] via SUBCUTANEOUS
  Administered 2011-12-05 – 2011-12-06 (×3): 2 [IU] via SUBCUTANEOUS
  Administered 2011-12-06 – 2011-12-07 (×2): 3 [IU] via SUBCUTANEOUS

## 2011-12-04 MED ORDER — FENTANYL CITRATE 0.05 MG/ML IJ SOLN
INTRAMUSCULAR | Status: DC | PRN
  Administered 2011-12-04 (×4): 25 ug via INTRAVENOUS
  Administered 2011-12-04: 100 ug via INTRAVENOUS

## 2011-12-04 MED ORDER — PROMETHAZINE HCL 25 MG PO TABS
12.5000 mg | ORAL_TABLET | ORAL | Status: DC | PRN
Start: 2011-12-04 — End: 2011-12-07

## 2011-12-04 MED ORDER — MAGNESIUM HYDROXIDE 400 MG/5ML PO SUSP
30.0000 mL | Freq: Every day | ORAL | Status: DC | PRN
Start: 2011-12-04 — End: 2011-12-07
  Administered 2011-12-06: 30 mL via ORAL
  Filled 2011-12-04: qty 30

## 2011-12-04 MED ORDER — VANCOMYCIN HCL IN DEXTROSE 1-5 GM/200ML-% IV SOLN
INTRAVENOUS | Status: AC
  Filled 2011-12-04: qty 200

## 2011-12-04 MED ORDER — SODIUM CHLORIDE 0.9 % IR SOLN
Status: DC | PRN
  Administered 2011-12-04: 09:00:00

## 2011-12-04 MED ORDER — VANCOMYCIN HCL 1000 MG IV SOLR
750.0000 mg | Freq: Two times a day (BID) | INTRAVENOUS | Status: AC
  Administered 2011-12-04 – 2011-12-05 (×3): 750 mg via INTRAVENOUS
  Filled 2011-12-04 (×4): qty 750

## 2011-12-04 MED ORDER — METHOCARBAMOL 500 MG PO TABS
500.0000 mg | ORAL_TABLET | Freq: Four times a day (QID) | ORAL | Status: DC | PRN
  Administered 2011-12-04 – 2011-12-05 (×2): 500 mg via ORAL
  Filled 2011-12-04 (×2): qty 1

## 2011-12-04 MED ORDER — ROCURONIUM BROMIDE 100 MG/10ML IV SOLN
INTRAVENOUS | Status: DC | PRN
  Administered 2011-12-04: 50 mg via INTRAVENOUS
  Administered 2011-12-04: 5 mg via INTRAVENOUS
  Administered 2011-12-04: 10 mg via INTRAVENOUS

## 2011-12-04 MED ORDER — DIPHENHYDRAMINE HCL 50 MG/ML IJ SOLN: 12.5000 mg | Freq: Four times a day (QID) | INTRAMUSCULAR | Status: DC | PRN

## 2011-12-04 MED ORDER — LACTATED RINGERS IV SOLN
INTRAVENOUS | Status: DC | PRN
  Administered 2011-12-04 (×2): via INTRAVENOUS

## 2011-12-04 MED ORDER — KETOROLAC TROMETHAMINE 30 MG/ML IJ SOLN
INTRAMUSCULAR | Status: AC
  Filled 2011-12-04: qty 1

## 2011-12-04 MED ORDER — GLIMEPIRIDE 4 MG PO TABS
4.0000 mg | ORAL_TABLET | Freq: Every day | ORAL | Status: DC
  Administered 2011-12-05 – 2011-12-07 (×3): 4 mg via ORAL
  Filled 2011-12-04 (×4): qty 1

## 2011-12-04 MED ORDER — HYDROMORPHONE HCL PF 1 MG/ML IJ SOLN
INTRAMUSCULAR | Status: AC
  Filled 2011-12-04: qty 1

## 2011-12-04 MED ORDER — ONDANSETRON HCL 4 MG/2ML IJ SOLN
INTRAMUSCULAR | Status: DC | PRN
  Administered 2011-12-04: 4 mg via INTRAVENOUS

## 2011-12-04 MED ORDER — CYCLOBENZAPRINE HCL 10 MG PO TABS: 10.0000 mg | ORAL_TABLET | Freq: Three times a day (TID) | ORAL | Status: DC | PRN

## 2011-12-04 MED ORDER — SODIUM CHLORIDE 0.9 % IJ SOLN: 3.0000 mL | INTRAMUSCULAR | Status: DC | PRN

## 2011-12-04 MED ORDER — MUPIROCIN 2 % EX OINT
TOPICAL_OINTMENT | Freq: Two times a day (BID) | CUTANEOUS | Status: AC
  Administered 2011-12-04 – 2011-12-05 (×3): via NASAL
  Filled 2011-12-04: qty 22

## 2011-12-04 MED ORDER — DROPERIDOL 2.5 MG/ML IJ SOLN: 0.6250 mg | INTRAMUSCULAR | Status: DC | PRN

## 2011-12-04 MED ORDER — GLYCOPYRROLATE 0.2 MG/ML IJ SOLN
INTRAMUSCULAR | Status: DC | PRN
  Administered 2011-12-04: .8 mg via INTRAVENOUS

## 2011-12-04 SURGICAL SUPPLY — 72 items
ADH SKN CLS APL DERMABOND .7 (GAUZE/BANDAGES/DRESSINGS)
ADH SKN CLS LQ APL DERMABOND (GAUZE/BANDAGES/DRESSINGS) ×1
APL SKNCLS STERI-STRIP NONHPOA (GAUZE/BANDAGES/DRESSINGS) ×1
BAG DECANTER FOR FLEXI CONT (MISCELLANEOUS) ×2 IMPLANT
BENZOIN TINCTURE PRP APPL 2/3 (GAUZE/BANDAGES/DRESSINGS) ×2 IMPLANT
BLADE SURG ROTATE 9660 (MISCELLANEOUS) IMPLANT
BUR PRECISION FLUTE 5.0 (BURR) ×2 IMPLANT
CAGE SABER 9X10X21MM (Cage) ×2 IMPLANT
CANISTER SUCTION 2500CC (MISCELLANEOUS) ×2 IMPLANT
CLOTH BEACON ORANGE TIMEOUT ST (SAFETY) ×2 IMPLANT
CONT SPEC 4OZ CLIKSEAL STRL BL (MISCELLANEOUS) ×4 IMPLANT
COVER BACK TABLE 24X17X13 BIG (DRAPES) IMPLANT
COVER TABLE BACK 60X90 (DRAPES) ×2 IMPLANT
DECANTER SPIKE VIAL GLASS SM (MISCELLANEOUS) ×2 IMPLANT
DERMABOND ADHESIVE PROPEN (GAUZE/BANDAGES/DRESSINGS) ×1
DERMABOND ADVANCED (GAUZE/BANDAGES/DRESSINGS)
DERMABOND ADVANCED .7 DNX12 (GAUZE/BANDAGES/DRESSINGS) ×1 IMPLANT
DERMABOND ADVANCED .7 DNX6 (GAUZE/BANDAGES/DRESSINGS) IMPLANT
DRAPE C-ARM 42X72 X-RAY (DRAPES) ×4 IMPLANT
DRAPE LAPAROTOMY 100X72X124 (DRAPES) ×2 IMPLANT
DRAPE POUCH INSTRU U-SHP 10X18 (DRAPES) ×2 IMPLANT
DRAPE PROXIMA HALF (DRAPES) IMPLANT
DRESSING TELFA 8X3 (GAUZE/BANDAGES/DRESSINGS) ×3 IMPLANT
DURAPREP 26ML APPLICATOR (WOUND CARE) ×2 IMPLANT
ELECT REM PT RETURN 9FT ADLT (ELECTROSURGICAL) ×2
ELECTRODE REM PT RTRN 9FT ADLT (ELECTROSURGICAL) ×1 IMPLANT
GAUZE SPONGE 4X4 16PLY XRAY LF (GAUZE/BANDAGES/DRESSINGS) IMPLANT
GLOVE BIOGEL PI IND STRL 7.0 (GLOVE) IMPLANT
GLOVE BIOGEL PI INDICATOR 7.0 (GLOVE) ×2
GLOVE ECLIPSE 7.5 STRL STRAW (GLOVE) ×5 IMPLANT
GLOVE EXAM NITRILE LRG STRL (GLOVE) IMPLANT
GLOVE EXAM NITRILE MD LF STRL (GLOVE) ×1 IMPLANT
GLOVE EXAM NITRILE XL STR (GLOVE) IMPLANT
GLOVE EXAM NITRILE XS STR PU (GLOVE) IMPLANT
GLOVE SURG SS PI 6.5 STRL IVOR (GLOVE) ×3 IMPLANT
GLOVE SURG SS PI 7.0 STRL IVOR (GLOVE) ×2 IMPLANT
GOWN BRE IMP SLV AUR LG STRL (GOWN DISPOSABLE) ×4 IMPLANT
GOWN BRE IMP SLV AUR XL STRL (GOWN DISPOSABLE) ×1 IMPLANT
GOWN STRL REIN 2XL LVL4 (GOWN DISPOSABLE) ×2 IMPLANT
KIT BASIN OR (CUSTOM PROCEDURE TRAY) ×2 IMPLANT
KIT ROOM TURNOVER OR (KITS) ×2 IMPLANT
MILL MEDIUM DISP (BLADE) ×1 IMPLANT
NEEDLE HYPO 22GX1.5 SAFETY (NEEDLE) ×3 IMPLANT
NS IRRIG 1000ML POUR BTL (IV SOLUTION) ×2 IMPLANT
PACK LAMINECTOMY NEURO (CUSTOM PROCEDURE TRAY) ×2 IMPLANT
PAD ARMBOARD 7.5X6 YLW CONV (MISCELLANEOUS) ×8 IMPLANT
PATTIES SURGICAL .75X.75 (GAUZE/BANDAGES/DRESSINGS) ×2 IMPLANT
PUTTY BONE DBX 5CC MIX (Putty) ×1 IMPLANT
ROD EXPEDIUM PREBENT 5.5X30MM (Rod) ×1 IMPLANT
ROD EXPEDIUM PREBENT 5.5X35MM (Rod) ×1 IMPLANT
SCREW EXPEDIUM POLYAXIAL 6X40 (Screw) ×1 IMPLANT
SCREW EXPEDIUM POLYAXIAL 6X45M (Screw) ×1 IMPLANT
SCREW EXPEDIUM POLYAXIAL 6X50M (Screw) ×2 IMPLANT
SCREW SET SINGLE INNER (Screw) ×4 IMPLANT
SPONGE GAUZE 4X4 12PLY (GAUZE/BANDAGES/DRESSINGS) ×2 IMPLANT
SPONGE LAP 4X18 X RAY DECT (DISPOSABLE) IMPLANT
SPONGE SURGIFOAM ABS GEL 100 (HEMOSTASIS) ×2 IMPLANT
STRIP CLOSURE SKIN 1/2X4 (GAUZE/BANDAGES/DRESSINGS) ×2 IMPLANT
STRIP NEXOSS 5CC (Neuro Prosthesis/Implant) ×1 IMPLANT
SUT VIC AB 0 CT1 18XCR BRD8 (SUTURE) ×2 IMPLANT
SUT VIC AB 0 CT1 8-18 (SUTURE) ×4
SUT VIC AB 2-0 CP2 18 (SUTURE) ×4 IMPLANT
SUT VIC AB 3-0 SH 8-18 (SUTURE) ×4 IMPLANT
SWABSTICK BENZOIN STERILE (MISCELLANEOUS) ×1 IMPLANT
SYR 20ML ECCENTRIC (SYRINGE) ×2 IMPLANT
SYR CONTROL 10ML LL (SYRINGE) ×1 IMPLANT
TAPE CLOTH SURG 4X10 WHT LF (GAUZE/BANDAGES/DRESSINGS) ×1 IMPLANT
TOWEL OR 17X24 6PK STRL BLUE (TOWEL DISPOSABLE) ×3 IMPLANT
TOWEL OR 17X26 10 PK STRL BLUE (TOWEL DISPOSABLE) ×2 IMPLANT
TRAP SPECIMEN MUCOUS 40CC (MISCELLANEOUS) ×1 IMPLANT
TRAY FOLEY CATH 14FRSI W/METER (CATHETERS) ×2 IMPLANT
WATER STERILE IRR 1000ML POUR (IV SOLUTION) ×2 IMPLANT

## 2011-12-04 NOTE — Interval H&P Note (Signed)
History and Physical Interval Note:  12/04/2011 7:42 AM  Hayden Garza  has presented today for surgery, with the diagnosis of Spondylolisthesis, Lumbar hnp with myelopathy, Lumbar radiculopathy, Lumbar stenosis  The various methods of treatment have been discussed with the patient and family. After consideration of risks, benefits and other options for treatment, the patient has consented to  Procedure(s) (LRB): POSTERIOR LUMBAR FUSION 1 LEVEL (N/A) as a surgical intervention .  The patient's history has been reviewed, patient examined, no change in status, stable for surgery.  I have reviewed the patients' chart and labs.  Questions were answered to the patient's satisfaction.     [name]

## 2011-12-04 NOTE — Op Note (Signed)
12/04/2011  10:59 AM  PATIENT:  Hayden Garza  78 y.o. male  PRE-OPERATIVE DIAGNOSIS:  Spondylolisthesis, Lumbar hnp with myelopathy, Lumbar radiculopathy, Lumbar stenosis L4-5  POST-OPERATIVE DIAGNOSIS:  Spondylolisthesis,Lumbar Herniated Nucleous Pulposus with Myelopathy; Lumbar Radiculopathy, Lumbar Stenosis  PROCEDURE:  Procedure(s): POSTERIOR LUMBAR decompression, decompressing L4 and L5 roots (2L),  PLIF L4-5 ,sabre  interbody cages L4-5 , expedium pedicle screw fixation L4-5 , autograft , allograft , bone marrow aspirate, postlateral fusion L4-5   SURGEON:  Surgeon(s): Clydene Fake, MD Reinaldo Meeker, MD - assist   ANESTHESIA:   general  EBL:  Total I/O In: 1200 [I.V.:1200] Out: 375 [Urine:75; Blood:300]  DRAINS: none   SPECIMEN:  No Specimen  DICTATION: Patient with back and left leg pain comment have the stenosis L4-5 with a disc herniation to the left side causing even further recess stenosis also having unstable spondylolisthesis at L4-5 level the patient brought in for decompression and fusion of the fourth of level.  Patient brought in from general anesthesia induced patient placed in prone position Wilson frame all pressure points padded. Patient prepped draped sterile fashion and 7 incision injected with 20 cc 1% lidocaine with epinephrine. Incision was made midline lower lumbar spine incision taken the fascia hemostasis obtained with Bovie position fascia was incised and subperiosteal dissection done over the 34 and 5 spinous process lamina out to the facet. Markers placed interspace at 45 and x-ray was done confirming or positioning. We then extended our exposure out laterally exposing the transverse processes of L4 and L5. We put markers on the transverse processes and which would be the pedicle entry points of L4 pocket another x-ray and this confirmed or positioning.  A partial laminectomy was then done with Leksell rongeurs Kerrison punches high-speed drill  removing the spinous process lamina most of the L4 the top of L5 decompress the central canal and decompressing L4 and L5 nerve roots bilaterally. To the left side and was extruded fragment disc was seen and this was removed decompressing the area. We checked for the epidural space a hemostasis with bipolar cauterization incised the disc space bilaterally performed discectomy distracted the interspace up to 10 mm to prepare the interspace for interbody fusion with scrapers and broaches and curettes and pituitary rongeurs continue to remove disc. We then packed 2 saber cages with autograft bone and some bone putty we packed the interspace with this the bone mixture in an tapped the cages into position 10 mm high saber cages were used to. All the bone removed during the laminectomy was cleaned from soft tissue chart the small pieces and used for this and fusion. Using fluoroscopy and intraocular marks on her pedicle entry points at L4-L5 decorticated with high-speed drill placed a probe down the pedicle checked for small ball probe tapped the pedicle aspirated bone marrow aspirate to placed on any excessive sponge and allograft substitute and then placed pedicle screws expedient screws were used 50 mm L4 40 mm on the right tibial 5 and 45 of the left L.-screws used to. Roger placed no screws locking nuts placed and these were final tightened. Lateral facets and transverse processes were decorticated and the rest of the autograft bone and the excess bone was subcutaneous with bone marrow aspirate and bone putty was packed in the posterolateral spaces for posterior fusion L4-5 bilaterally. We then checked the dura from the dura and nerve root redo decompression L4 and L5 roots bilaterally along with the central canal cages were in good  firm position we did hemostasis with Gelfoam was placed over the lateral gutters of the bone graft which continue the nerve roots retractors removed with very good hemostasis fascia  closed with 0 Vicryl interrupted sutures subcutaneous tissue closed with 020 3-0 Vicryl interrupted sutures skin closed benzoin Steri-Strips dressing was placed patient placed back in spine position woken from anesthesia and transferred recovery  PLAN OF CARE: Admit to inpatient   PATIENT DISPOSITION:  PACU - hemodynamically stable.

## 2011-12-04 NOTE — Transfer of Care (Signed)
Immediate Anesthesia Transfer of Care Note  Patient: Hayden Garza  Procedure(s) Performed: Procedure(s) (LRB): POSTERIOR LUMBAR FUSION 1 LEVEL (N/A)  Patient Location: PACU  Anesthesia Type: General  Level of Consciousness: sedated  Airway & Oxygen Therapy: Patient Spontanous Breathing and Patient connected to nasal cannula oxygen  Post-op Assessment: Report given to PACU RN and Post -op Vital signs reviewed and stable  Post vital signs: Reviewed and stable  Complications: No apparent anesthesia complications

## 2011-12-04 NOTE — Anesthesia Preprocedure Evaluation (Addendum)
Anesthesia Evaluation  Patient identified by MRN, date of birth, ID band Patient awake    Reviewed: Allergy & Precautions, H&P , NPO status , Patient's Chart, lab work & pertinent test results  History of Anesthesia Complications Negative for: history of anesthetic complications  Airway Mallampati: II TM Distance: >3 FB     Dental  (+) Dental Advisory Given, Teeth Intact and Caps   Pulmonary neg pulmonary ROS,  breath sounds clear to auscultation  Pulmonary exam normal       Cardiovascular negative cardio ROS  Rhythm:Regular Rate:Normal     Neuro/Psych negative psych ROS   GI/Hepatic Neg liver ROS, GERD-  Medicated,  Endo/Other  Diabetes mellitus-, Type 2, Oral Hypoglycemic Agents  Renal/GU negative Renal ROS     Musculoskeletal   Abdominal   Peds  Hematology   Anesthesia Other Findings   Reproductive/Obstetrics                          Anesthesia Physical Anesthesia Plan  ASA: III  Anesthesia Plan: General   Post-op Pain Management:    Induction: Intravenous  Airway Management Planned: Oral ETT  Additional Equipment:   Intra-op Plan:   Post-operative Plan: Extubation in OR  Informed Consent: I have reviewed the patients History and Physical, chart, labs and discussed the procedure including the risks, benefits and alternatives for the proposed anesthesia with the patient or authorized representative who has indicated his/her understanding and acceptance.     Plan Discussed with: CRNA, Anesthesiologist and Surgeon  Anesthesia Plan Comments:         Anesthesia Quick Evaluation

## 2011-12-04 NOTE — Plan of Care (Signed)
Problem: Consults Goal: Diagnosis - Spinal Surgery Outcome: Completed/Met Date Met:  12/04/11 Thoraco/Lumbar Spine Fusion     

## 2011-12-04 NOTE — Progress Notes (Signed)
PHARMACIST - PHYSICIAN ORDER COMMUNICATION  CONCERNING: P&T Medication Policy on Herbal Medications  DESCRIPTION:  This patient's order for:  Co Q 10  has been noted.  This product(s) is classified as an "herbal" or natural product. Due to a lack of definitive safety studies or FDA approval, nonstandard manufacturing practices, plus the potential risk of unknown drug-drug interactions while on inpatient medications, the Pharmacy and Therapeutics Committee does not permit the use of "herbal" or natural products of this type within [facility].   ACTION TAKEN: The pharmacy department is unable to verify this order at this time and your patient has been informed of this safety policy. Please reevaluate patient's clinical condition at discharge and address if the herbal or natural product(s) should be resumed at that time.   Bayard Hugger, PharmD, BCPS  Clinical Pharmacist  Pager: [phone]

## 2011-12-04 NOTE — Progress Notes (Signed)
UR COMPLETED  

## 2011-12-04 NOTE — H&P (Signed)
See H& P.

## 2011-12-04 NOTE — Progress Notes (Signed)
Vanc consult:  S/p lumbar decompression. Received vanc x1 preop. Ordered for 3 more doses postop.  Plan  Vanc 750mg  IV q12 x3 doses Rx to sign off

## 2011-12-04 NOTE — Anesthesia Postprocedure Evaluation (Signed)
Anesthesia Post Note  Patient: Hayden Garza  Procedure(s) Performed: Procedure(s) (LRB): POSTERIOR LUMBAR FUSION 1 LEVEL (N/A)  Anesthesia type: general  Patient location: PACU  Post pain: Pain level controlled  Post assessment: Patient's Cardiovascular Status Stable  Last Vitals:  Filed Vitals:   12/04/11 1239  BP:   Pulse: 81  Temp: 37 C  Resp: 12    Post vital signs: Reviewed and stable  Level of consciousness: sedated  Complications: No apparent anesthesia complications

## 2011-12-04 NOTE — Preoperative (Signed)
Beta Blockers   Reason not to administer Beta Blockers:Not Applicable 

## 2011-12-05 LAB — GLUCOSE, CAPILLARY: Glucose-Capillary: 142 mg/dL — ABNORMAL HIGH (ref 70–99)

## 2011-12-05 MED ORDER — OXYCODONE-ACETAMINOPHEN 5-325 MG PO TABS: 1.0000 | ORAL_TABLET | ORAL | Status: DC | PRN

## 2011-12-05 MED ORDER — HYDROCODONE-ACETAMINOPHEN 10-325 MG PO TABS
1.0000 | ORAL_TABLET | ORAL | Status: DC | PRN
  Administered 2011-12-05 – 2011-12-07 (×7): 1 via ORAL
  Filled 2011-12-05 (×7): qty 1

## 2011-12-05 MED ORDER — TRAMADOL HCL 50 MG PO TABS
50.0000 mg | ORAL_TABLET | Freq: Four times a day (QID) | ORAL | Status: DC | PRN
  Filled 2011-12-05: qty 1

## 2011-12-05 MED ORDER — MORPHINE SULFATE 2 MG/ML IJ SOLN: 1.0000 mg | INTRAMUSCULAR | Status: DC | PRN

## 2011-12-05 NOTE — Progress Notes (Signed)
Doing well. C/o appropriate incisional soreness. No leg pain No Numbness, tingling, weakness No Nausea /vomiting Amb/ voiding well  Temp:  [98 F (36.7 C)-99.8 F (37.7 C)] 99.4 F (37.4 C) (06/26 0810) Pulse Rate:  [78-106] 106  (06/26 0810) Resp:  [6-20] 18  (06/26 0810) BP: (96-124)/(51-72) 105/58 mmHg (06/26 0810) SpO2:  [91 %-99 %] 91 % (06/26 0810) FiO2 (%):  [14 %] 14 % (06/25 2000) Weight:  [69.854 kg (154 lb)] 69.854 kg (154 lb) (06/25 1326) Good strength and sensation Incision CDI  Plan: Increase activity - change to po meds

## 2011-12-05 NOTE — Evaluation (Signed)
Physical Therapy Evaluation Patient Details Name: Hayden Garza MRN: [id number] DOB: 1932/09/11 Today's Date: 12/05/2011 Time: 6578-4696 PT Time Calculation (min): 45 min  PT Assessment / Plan / Recommendation Clinical Impression  Pt did well with his ambulation today.  able to stand tall when walking.  Pt easily fatiqued and said this was problem before surgery.  Pt educated on back precautions but has hard time remembering adn following them - they do not come natureal to him.  Pts wife present for all education. pt has history of right shoulder replacement and chronic left hip pain.  pt would benefit from  HHPT to work on his safety in home situation.  Wife already concerned about getting pt in and out of bed correctly and stated she will just let him hold her around the neck to lay down and sit up due to  limitiations with his shoulder.  Edcation done for back precautions , body mechanics nad brace,  pt and family will need reinforcement    PT Assessment  Patient needs continued PT services    Follow Up Recommendations  Home health PT    Barriers to Discharge        Equipment Recommendations  None recommended by PT    Recommendations for Other Services     Frequency Min 5X/week    Precautions / Restrictions Precautions Precautions: Back Required Braces or Orthoses: Spinal Brace Spinal Brace: Applied in sitting position Restrictions Weight Bearing Restrictions: No   Pertinent Vitals/Pain NA      Mobility  Bed Mobility Bed Mobility: Rolling Right Rolling Right: 4: Min guard;With rail Details for Bed Mobility Assistance: Pt has  history of right shoulder replacement and has left hip pain.  He isnt comfortabel laying on either side very long Transfers Transfers: Sit to Stand Sit to Stand: 4: Min guard;With upper extremity assist Ambulation/Gait Ambulation/Gait Assistance: 4: Min assist Ambulation Distance (Feet): 120 Feet Assistive device: None Ambulation/Gait  Assistance Details: Pt standing up nice and tall today.  WHen he got tired he started looking at ground and needed cues to stay tall Stairs: No    Exercises     PT Diagnosis: Difficulty walking;Generalized weakness  PT Problem List: Decreased activity tolerance;Decreased mobility;Decreased knowledge of precautions PT Treatment Interventions: Gait training;Stair training;Therapeutic activities;Patient/family education   PT Goals Acute Rehab PT Goals PT Goal Formulation: With patient/family Pt will go Supine/Side to Sit: with supervision PT Goal: Supine/Side to Sit - Progress: Goal set today Pt will go Sit to Stand: with supervision PT Goal: Sit to Stand - Progress: Goal set today Pt will Ambulate: >150 feet;with supervision PT Goal: Ambulate - Progress: Goal set today Pt will Go Up / Down Stairs: 1-2 stairs;with rail(s) PT Goal: Up/Down Stairs - Progress: Goal set today  Visit Information  Last PT Received On: 12/05/11 Assistance Needed: +1    Subjective Data  Subjective: Ive already been up Patient Stated Goal: To walk up tall and back not hurt   Prior Functioning  Home Living Lives With: Spouse Type of Home: House Home Access: Stairs to enter [employer] of Steps: 2 steps to enter house Home Layout: One level Prior Function Level of Independence: Independent Able to Take Stairs?: No Driving: Yes Vocation: Retired Comments: Pt was walking iwth no device and a lot of difficulty and pain and pt forward flexed Communication Communication: No difficulties (wife present for eval)    Cognition  Overall Cognitive Status: Appears within functional limits for tasks assessed/performed Arousal/Alertness: Lethargic (due  to pain meds and muscle relaxors) Orientation Level: Appears intact for tasks assessed Behavior During Session: [facility] for tasks performed    Extremity/Trunk Assessment Right Lower Extremity Assessment RLE ROM/Strength/Tone: [facility] for tasks assessed Left  Lower Extremity Assessment LLE ROM/Strength/Tone: [facility] for tasks assessed   Balance Balance Balance Assessed: No High Level Balance High Level Balance Comments: no obvious balance issues seen today with basic ambulation  End of Session PT - End of Session Equipment Utilized During Treatment: Gait belt;Back brace Activity Tolerance: Patient limited by fatigue (Pt walked and went to the bathroom and was tired and sweaty) Patient left: in bed;with family/visitor present Nurse Communication: Mobility status;Precautions  GP     Judson Roch 12/05/2011, 11:42 AM

## 2011-12-05 NOTE — Evaluation (Signed)
Occupational Therapy Evaluation Patient Details Name: JERRED ZAREMBA MRN: [id number] DOB: 1933-01-23 Today's Date: 12/05/2011 Time: 7829-5621 OT Time Calculation (min): 13 min  OT Assessment / Plan / Recommendation Clinical Impression  This 78 yo male s/p back fusion presents to acute OT with problems below. Will benefit from acute OT to get to a S-Mod I level.    OT Assessment  Patient needs continued OT Services    Follow Up Recommendations  No OT follow up    Barriers to Discharge None    Equipment Recommendations  3 in 1 bedside comode    Recommendations for Other Services    Frequency  Min 2X/week    Precautions / Restrictions Precautions Precautions: Back Spinal Brace: Lumbar corset;Applied in sitting position (Did OK pt to put on in standing since hard in sitting) Restrictions Weight Bearing Restrictions: No       ADL  Eating/Feeding: Simulated;Set up Where Assessed - Eating/Feeding: Bed level Grooming: Simulated;Set up;Supervision/safety Where Assessed - Grooming: Unsupported sitting Upper Body Bathing: Simulated;Set up;Supervision/safety Where Assessed - Upper Body Bathing: Unsupported sitting Lower Body Bathing: Simulated;Moderate assistance Where Assessed - Lower Body Bathing: Unsupported sit to stand Upper Body Dressing: Simulated;Minimal assistance Where Assessed - Upper Body Dressing: Unsupported sitting Lower Body Dressing: Simulated;Moderate assistance Where Assessed - Lower Body Dressing: Unsupported sit to stand Toilet Transfer: Simulated;Minimal assistance Toilet Transfer Method: Sit to stand Toilet Transfer Equipment:  (hallway to sit EOB) Toileting - Clothing Manipulation and Hygiene: Simulated;Minimal assistance Where Assessed - Engineer, mining and Hygiene: Standing Equipment Used: Back brace Transfers/Ambulation Related to ADLs: Min guard A    OT Diagnosis: Generalized weakness;Acute pain  OT Problem List: Decreased  strength;Decreased activity tolerance;Impaired balance (sitting and/or standing);Decreased knowledge of use of DME or AE;Pain OT Treatment Interventions: Self-care/ADL training;DME and/or AE instruction;Patient/family education;Balance training   OT Goals Acute Rehab OT Goals OT Goal Formulation: With patient/family Time For Goal Achievement: 12/07/11 Potential to Achieve Goals: Good ADL Goals Pt Will Perform Grooming: with set-up;with supervision;Unsupported;Standing at sink (2 tasks) ADL Goal: Grooming - Progress: Goal set today Pt Will Perform Tub/Shower Transfer: Shower transfer;with supervision;with DME;Ambulation;Maintaining back safety precautions ADL Goal: Tub/Shower Transfer - Progress: Goal set today Miscellaneous OT Goals Miscellaneous OT Goal #1: Pt will be Independent with in and OOB for BADLs. OT Goal: Miscellaneous Goal #1 - Progress: Goal set today Miscellaneous OT Goal #2: Pt will be able to don and doff brace with supervision. OT Goal: Miscellaneous Goal #2 - Progress: Goal set today  Visit Information  Last OT Received On: 12/05/11 Assistance Needed: +1    Subjective Data  Patient Stated Goal: To go home tomorrow   Prior Functioning  Home Living Lives With: Spouse Available Help at Discharge: Family;Available 24 hours/day Type of Home: House Home Access: Stairs to enter [employer] of Steps: 2 Home Layout: One level Bathroom Shower/Tub: Walk-in shower;Door Teacher, early years/pre: No Home Adaptive Equipment: None Prior Function Level of Independence: Independent Able to Take Stairs?: No Driving: Yes Vocation: Retired Musician:  (impaired by meds) Dominant Hand: Right    Cognition  Overall Cognitive Status: Appears within functional limits for tasks assessed/performed Arousal/Alertness: Lethargic (due to meds) Orientation Level: Appears intact for tasks assessed Behavior During Session: [facility] for  tasks performed    Extremity/Trunk Assessment Right Upper Extremity Assessment RUE ROM/Strength/Tone: Within functional levels (has had shoulder replacement) Left Upper Extremity Assessment LUE ROM/Strength/Tone: Within functional levels   Mobility Bed Mobility Bed Mobility: Sit to Sidelying  Right;Rolling Left Transfers Transfers: Sit to Stand;Stand to Sit Sit to Stand: 4: Min guard;With upper extremity assist;From bed Stand to Sit: 4: Min guard;With upper extremity assist;To bed   Exercise    Balance    End of Session OT - End of Session Equipment Utilized During Treatment: Back brace Activity Tolerance: Patient limited by pain Patient left: in bed;with call bell/phone within reach;with family/visitor present Nurse Communication:  (Nurse saw pt walking in hall with wife)  GO     Evette Georges [phone] 12/05/2011, 3:45 PM

## 2011-12-06 LAB — GLUCOSE, CAPILLARY: Glucose-Capillary: 173 mg/dL — ABNORMAL HIGH (ref 70–99)

## 2011-12-06 MED FILL — Heparin Sodium (Porcine) Inj 1000 Unit/ML: INTRAMUSCULAR | Qty: 30 | Status: AC

## 2011-12-06 MED FILL — Sodium Chloride IV Soln 0.9%: INTRAVENOUS | Qty: 1000 | Status: AC

## 2011-12-06 MED FILL — Sodium Chloride Irrigation Soln 0.9%: Qty: 3000 | Status: AC

## 2011-12-06 NOTE — Progress Notes (Signed)
Doing well. C/o appropriate incisional soreness. No leg pain No Numbness, tingling, weakness No Nausea /vomiting Amb/ voiding well  Temp:  [99 F (37.2 C)-100.1 F (37.8 C)] 99 F (37.2 C) (06/27 0055) Pulse Rate:  [96-106] 96  (06/27 0055) Resp:  [18-20] 18  (06/27 0055) BP: (105-124)/(50-63) 108/58 mmHg (06/27 0055) SpO2:  [91 %-95 %] 95 % (06/27 0055) FiO2 (%):  [18 %] 18 % (06/26 0823) Good strength and sensation Incision CDI  Plan: Needs to increase activity - ?d/c in am if doing well

## 2011-12-06 NOTE — Progress Notes (Signed)
Pt requested that he not be disturbed at night. Does not want to be woken up for vital signs. Pt said he didn't get a good night sleep last night because he was being awakened in the middle of the night for vital signs.  Pt made this complaint and voiced this request in the presence of the out-going nurse, Ozella Almond, RN.

## 2011-12-06 NOTE — Progress Notes (Signed)
Seen and agreed 12/06/2011 Fredrich Birks PTA [phone] pager [phone] office

## 2011-12-06 NOTE — Progress Notes (Signed)
Physical Therapy Treatment Patient Details Name: Hayden Garza MRN: [id number] DOB: 10/25/1932 Today's Date: 12/06/2011 Time: 4098-1191 PT Time Calculation (min): 22 min  PT Assessment / Plan / Recommendation Comments on Treatment Session  Pt. was seated EOB with brace on at start of session.  Wife was present and was helpful in responding to questions and assisting pt in remembering back precautions.  Wife stated that she assisted pt in applying back brace.   Wife's concern was bed mobility, actively reeducated her on proper form for log rolling.  Pt. educated at this time not to attempt stairs in basement until  mobility improves.  Continue with POC.      Follow Up Recommendations  Home health PT    Barriers to Discharge        Equipment Recommendations  3 in 1 bedside comode    Recommendations for Other Services    Frequency Min 5X/week   Plan      Precautions / Restrictions Precautions Precautions: Back Precaution Comments: Pt seemed distracted by breakfast.  Able to name 2/3 precautions.  Wife named 3/3 Required Braces or Orthoses: Spinal Brace Spinal Brace: Lumbar corset;Applied in sitting position Restrictions Weight Bearing Restrictions: No   Pertinent Vitals/Pain 6/10    Mobility  Bed Mobility Bed Mobility: Right Sidelying to Sit;Sit to Sidelying Right Rolling Right: 3: Mod assist Right Sidelying to Sit: 3: Mod assist Sit to Sidelying Right: 3: Mod assist Details for Bed Mobility Assistance: Demonstrated ways wife could assist husband with log rolling technique.   Husband required VC and A for correct hand placement and correct for to observe back precautions.  Provided assistance to help husband lift trunk from bed and to raise/lower legs from bed.  Wife stated that husband was able to use UE to lift /lower himself onto bed. (history of R shoulder replacement). Transfers Sit to Stand: 4: Min guard;With upper extremity assist;From bed Stand to Sit: 4: Min guard;With  upper extremity assist;To bed Details for Transfer Assistance: Pt required encouragement to stand due to pain.  Pt returned to EOB to eat breakfast.   Ambulation/Gait Ambulation/Gait Assistance: 4: Min guard Ambulation Distance (Feet): 100 Feet Assistive device: None Ambulation/Gait Assistance Details: Min guard assist for safety. Gait Pattern: Decreased stride length;Step-through pattern Gait velocity: decreased gait speed General Gait Details: Pt had upright posture during ambulation, but was also guarded and stiff.   Pt. was fatigued and hungry, so returned to room rather than ambulate further.   Stairs: Yes Stairs Assistance: 4: Min assist Stairs Assistance Details (indicate cue type and reason): Pt required min guard assist ascending stairs.   Alternating pattern ascending (pt was fatigued at top of stair).  Pt. required min assist for safety while descending stairs.  Req VC to slow down and for step to pattern.   Stair Management Technique: One rail Right;Alternating pattern;Step to pattern;One rail Left Number of Stairs: 15     Exercises     PT Diagnosis:    PT Problem List:   PT Treatment Interventions:     PT Goals Acute Rehab PT Goals PT Goal: Supine/Side to Sit - Progress: Progressing toward goal PT Goal: Sit to Stand - Progress: Progressing toward goal PT Goal: Ambulate - Progress: Progressing toward goal PT Goal: Up/Down Stairs - Progress: Progressing toward goal  Visit Information  Last PT Received On: 12/06/11 Assistance Needed: +1    Subjective Data      Cognition  Overall Cognitive Status: Appears within functional limits for tasks  assessed/performed Arousal/Alertness: Awake/alert Orientation Level: Appears intact for tasks assessed Behavior During Session: [facility] for tasks performed Cognition - Other Comments: Wife offered VC for back precautions, and seemed to provide a lot of support for husband    Balance  Balance Balance Assessed: Yes Static Sitting  Balance Static Sitting - Balance Support: Feet supported;No upper extremity supported Static Sitting - Level of Assistance: 7: Independent Static Sitting - Comment/# of Minutes: Pt was seated at EOB at start of session and returned to EOB to eat breakfast.  Pt demonstrates good posture and balance unsupported.  End of Session PT - End of Session Equipment Utilized During Treatment: Gait belt;Back brace Activity Tolerance: Patient limited by pain Patient left: with call bell/phone within reach;with family/visitor present;in bed Nurse Communication: Mobility status   GP     Adar Rase 12/06/2011, 9:45 AM

## 2011-12-06 NOTE — Progress Notes (Signed)
Occupational Therapy Treatment Patient Details Name: Hayden Garza MRN: [id number] DOB: Jun 09, 1933 Today's Date: 12/06/2011 Time: 9147-8295 OT Time Calculation (min): 41 min  OT Assessment / Plan / Recommendation Comments on Treatment Session Pt. progressing with ADLs - currently requires supervision. Pt. with poor recall of back precautions.  Wife however, with good understanding, and able to cue/assist pt as needed    Follow Up Recommendations  No OT follow up    Barriers to Discharge       Equipment Recommendations  3 in 1 bedside comode    Recommendations for Other Services    Frequency Min 2X/week   Plan Discharge plan remains appropriate    Precautions / Restrictions Precautions Precautions: Back Precaution Comments: Pt. only able to state 1/3 back precautions.  Wife able to state all three.  Pt. also unable to provide correct answers to hypothetical situations involving back precautions Required Braces or Orthoses: Spinal Brace Spinal Brace: Lumbar corset;Applied in sitting position Restrictions Weight Bearing Restrictions: No   Pertinent Vitals/Pain     ADL  Upper Body Bathing: Simulated;Supervision/safety Where Assessed - Upper Body Bathing: Unsupported standing Lower Body Bathing: Simulated;Min guard Where Assessed - Lower Body Bathing: Unsupported standing Lower Body Dressing: Simulated;Supervision/safety Where Assessed - Lower Body Dressing: Unsupported sit to stand Toilet Transfer: Supervision/safety;Performed Toilet Transfer Method: Sit to Barista: Comfort height toilet;Grab bars Toileting - Architect and Hygiene: Simulated;Supervision/safety Where Assessed - Engineer, mining and Hygiene: Standing Tub/Shower Transfer: Landscape architect Method: Science writer: Walk in Scientist, research (physical sciences) Used: Back brace Transfers/Ambulation Related to ADLs:  supervision in room ADL Comments: with reminders reL: safe technique, pt. able to demonstrate abililty to perform LB ADLs.  Simulated shower in standng.  Discussed use of LH sponge/brush - wife reports they have one.     OT Diagnosis:    OT Problem List:   OT Treatment Interventions:     OT Goals Acute Rehab OT Goals OT Goal Formulation: With patient/family ADL Goals ADL Goal: Tub/Shower Transfer - Progress: Progressing toward goals Miscellaneous OT Goals OT Goal: Miscellaneous Goal #1 - Progress: Progressing toward goals OT Goal: Miscellaneous Goal #2 - Progress: Progressing toward goals  Visit Information  Last OT Received On: 12/06/11 Assistance Needed: +1    Subjective Data      Prior Functioning       Cognition  Overall Cognitive Status: Impaired Area of Impairment: Memory Arousal/Alertness: Awake/alert Orientation Level: Appears intact for tasks assessed Behavior During Session: Flat affect Memory: Decreased recall of precautions Cognition - Other Comments: Wife offered VC for back precautions, and seemed to provide a lot of support for husband    Mobility Bed Mobility Bed Mobility: Sitting - Scoot to Edge of Bed;Rolling Right;Right Sidelying to Sit;Sit to Sidelying Right;Scooting to [facility] Rolling Right: 5: Supervision Right Sidelying to Sit: 4: Min guard;HOB flat Sitting - Scoot to Edge of Bed: 5: Supervision Sit to Sidelying Right: 5: Supervision;HOB flat Scooting to HOB: 2: Max assist Details for Bed Mobility Assistance: Pt. initially attempted to move supine to sit instead of log rolling, but corrected with min verbal cues.  Wife reports bed at home is high.  Discussed options with them including pt. sleeping in spare room with lower bed.  Pt unable to scoot to [facility], encouraged pt. to sit as high up in the bed as possible before lying down Transfers Transfers: Sit to Stand;Stand to Sit Sit to Stand: 5: Supervision;With upper extremity assist;From bed;From  toilet Stand  to Sit: 5: Supervision;To toilet;To bed Details for Transfer Assistance: Pt required encouragement to stand due to pain.  Pt returned to EOB to eat breakfast.     Exercises    Balance Balance Balance Assessed: Yes Static Sitting Balance Static Sitting - Balance Support: Feet supported;No upper extremity supported Static Sitting - Level of Assistance: 7: Independent Static Sitting - Comment/# of Minutes: Pt was seated at EOB at start of session and returned to EOB to eat breakfast.  Pt demonstrates good posture and balance unsupported.  End of Session OT - End of Session Equipment Utilized During Treatment: Back brace Activity Tolerance: Patient tolerated treatment well Patient left: in bed;with call bell/phone within reach;with family/visitor present Nurse Communication: Patient requests pain meds  GO     Hayden Garza, Ursula Alert M 12/06/2011, 11:46 AM

## 2011-12-07 LAB — GLUCOSE, CAPILLARY: Glucose-Capillary: 153 mg/dL — ABNORMAL HIGH (ref 70–99)

## 2011-12-07 MED ORDER — CYCLOBENZAPRINE HCL 10 MG PO TABS: 10.0000 mg | ORAL_TABLET | Freq: Three times a day (TID) | ORAL | Status: AC | PRN

## 2011-12-07 NOTE — Progress Notes (Signed)
Physical Therapy Treatment Patient Details Name: Hayden Garza MRN: [id number] DOB: [birth year] Today's Date: 12/07/2011 Time: 4098-1191 PT Time Calculation (min): 15 min  PT Assessment / Plan / Recommendation Comments on Treatment Session  Focus of treatment session was bed mobility with wife.  This had been a concern of the wife of the patient.  We reviewed back precautions and practiced bed mobility with the wife providing asssistance and adhering to back precautions.  Patient declined to ambulate due to pain.      Follow Up Recommendations  Home health PT    Barriers to Discharge        Equipment Recommendations  3 in 1 bedside comode    Recommendations for Other Services    Frequency Min 5X/week   Plan Discharge plan remains appropriate;Frequency remains appropriate    Precautions / Restrictions Precautions Precautions: Back Precaution Comments: Pt was not very talkative due to pain in back.  Wife was able to state 3/3 precautions Required Braces or Orthoses: Spinal Brace Spinal Brace: Lumbar corset;Applied in sitting position Restrictions Weight Bearing Restrictions: No   Pertinent Vitals/Pain     Mobility  Bed Mobility Rolling Right: 4: Min assist Right Sidelying to Sit: 4: Min assist;HOB flat Sit to Sidelying Right: 4: Min assist;HOB flat Details for Bed Mobility Assistance: Demonstrated proper body mechanics and technique of provding assistance for bed mobility.  Wife was able to verbalize and demonstrate proper form for following back precautions.    Exercises     PT Diagnosis:    PT Problem List:   PT Treatment Interventions:     PT Goals Acute Rehab PT Goals PT Goal: Supine/Side to Sit - Progress: Progressing toward goal PT Goal: Sit to Stand - Progress: Progressing toward goal  Visit Information  Last PT Received On: 12/07/11 Assistance Needed: +1    Subjective Data      Cognition  Overall Cognitive Status: Impaired Area of Impairment:  Memory Arousal/Alertness: Lethargic Orientation Level: Appears intact for tasks assessed Behavior During Session: Flat affect Memory: Decreased recall of precautions Cognition - Other Comments: Wife knew 3/3 precautions and stated that she was helping pt at home    Balance     End of Session PT - End of Session Equipment Utilized During Treatment: Back brace Activity Tolerance: Patient limited by pain Patient left: in bed;with family/visitor present;with call bell/phone within reach Nurse Communication: Mobility status;Patient requests pain meds   GP     Nithila Sumners, SPTA 12/07/2011, 12:45 PM

## 2011-12-07 NOTE — Discharge Summary (Signed)
Physician Discharge Summary  Patient ID: Hayden Garza MRN: [id number] DOB/AGE: 1932-11-01 78 y.o.  Admit date: 12/04/2011 Discharge date: 12/07/2011  Admission Diagnoses:Spondylolisthesis, Lumbar hnp with myelopathy, Lumbar radiculopathy, Lumbar stenosis L4-5      Discharge Diagnoses: Spondylolisthesis, Lumbar hnp with myelopathy, Lumbar radiculopathy, Lumbar stenosis L4-5     Active Problems:  * No active hospital problems. *    Discharged Condition: good  Hospital Course: pt admitted day of surgery - underwent procedure below - pt has had less leg pain, ambulating well, d/c home  Consults: None  Significant Diagnostic Studies: none  Treatments: surgery: POSTERIOR LUMBAR decompression, decompressing L4 and L5 roots (2L), PLIF L4-5 ,sabre interbody cages L4-5 , expedium pedicle screw fixation L4-5 , autograft , allograft , bone marrow aspirate, postlateral fusion L4-5    Discharge Exam: Blood pressure 114/65, pulse 100, temperature 99.4 F (37.4 C), temperature source Oral, resp. rate 16, height 5\' 5"  (1.651 m), weight 69.854 kg (154 lb), SpO2 92.00%. Wound:c/d/i  Disposition: home   Medication List  As of 12/07/2011  8:46 AM   STOP taking these medications         diclofenac 75 MG EC tablet         TAKE these medications         aspirin EC 81 MG tablet   Take 81 mg by mouth daily.      CO Q 10 PO   Take 1 tablet by mouth daily.      cyclobenzaprine 10 MG tablet   Commonly known as: FLEXERIL   Take 1 tablet (10 mg total) by mouth 3 (three) times daily as needed for muscle spasms.      doxazosin 8 MG tablet   Commonly known as: CARDURA   Take 8 mg by mouth at bedtime.      Fish Oil Oil   Take 1 capsule by mouth daily.      glimepiride 4 MG tablet   Commonly known as: AMARYL   Take 4 mg by mouth daily before breakfast.      metFORMIN 500 MG tablet   Commonly known as: GLUCOPHAGE   Take 1,000 mg by mouth 2 (two) times daily with a meal.     omeprazole 20 MG capsule   Commonly known as: PRILOSEC   Take 20 mg by mouth daily as needed. heartburn      rosuvastatin 10 MG tablet   Commonly known as: CRESTOR   Take 10 mg by mouth 2 (two) times a week.      zolpidem 5 MG tablet   Commonly known as: AMBIEN   Take 5 mg by mouth at bedtime as needed. sleep             Signed: Clydene Fake, MD 12/07/2011, 8:46 AM

## 2011-12-07 NOTE — Care Management Note (Addendum)
Page 2 of 2   12/07/2011     9:36:34 AM   CARE MANAGEMENT NOTE 12/07/2011  Patient:  IZEN, PETZ   Account Number:  [id number]  Date Initiated:  12/07/2011  Documentation initiated by:  Anette Guarneri  Subjective/Objective Assessment:   POD#3 s/p Lumbar fusion     Action/Plan:   home w/HH services and DME as needed  PT recommends HHPT and 3n1   Anticipated DC Date:  12/07/2011   Anticipated DC Plan:  HOME W HOME HEALTH SERVICES      DC Planning Services  CM consult      Choice offered to / List presented to:  C-3 Spouse        HH arranged  HH-2 PT      HH agency  [facility]   Status of service:  Completed, signed off Medicare Important Message given?   (If response is "NO", the following Medicare IM given date fields will be blank) Date Medicare IM given:   Date Additional Medicare IM given:    Discharge Disposition:  HOME W HOME HEALTH SERVICES  Per UR Regulation:  Reviewed for med. necessity/level of care/duration of stay  If discussed at Long Length of Stay Meetings, dates discussed:    Comments:  12/07/11 09:33 Anette Guarneri RN/CM Spoke with wife and patient, wife's choice for [facility] services is [facility] contacted [facility] and arranged for HHPT to begin after d/c 3n1 ordered but wife and patient not sure if they want one at this time, printed copy of order and gave to wife and instructed her that she could obtain one from [facility] if she changed her mind.  HOME HEALTH AGENCIES SERVING [place]   Agencies that are Medicare-Certified and are affiliated with [facility]  Telephone Number Address  [facility]   The [facility] has ownership interest in this company; however, you are under no obligation to use this agency. [phone] or  [phone] [address] [place], [place] [address]   Agencies that are Medicare-Certified and are not affiliated with [facility] Telephone Number Address  [facility] [phone] Fax [phone] [address] [place], [place]  [address]  [facility] [phone] or [phone] Fax [phone] [address] [place], [place] [address]  [facility] [phone] Fax [phone] [address] [place], [place] [address]  [facility] [phone] Fax [phone] [address] [place], [place]  [address]  [facility] [phone] Fax [phone] [address] Suite [place], [place] [address]  [facility] [phone] [address] [place], [place] [address]  [facility] [phone]  [address] [place], [place] [address]  [facility] [phone] or [phone] Fax [phone] [address] [place], [place]  [address]  [facility] [phone] Fax [phone] [place], [place]  [address]  [facility]  [phone] Fax [phone] [address] [place], [place] [address]

## 2011-12-07 NOTE — Discharge Instructions (Signed)
Wound Care Keep incision covered and dry  For 5 days.  If you shower prior to then, cover incision with plastic wrap.  You may remove outer bandage after 5 days and shower.  Do not put any creams, lotions, or ointments on incision. Leave steri-strips on.  They will fall off by themselves. Activity Walk each and every day, increasing distance each day. No lifting greater than 5 lbs.  Avoid bending, arching, or twisting. No driving for 2 weeks; may ride as a passenger locally. If provided with back brace, wear when out of bed.  It is not necessary to wear brace in bed. Diet Resume your normal diet.  Return to Work Will be discussed at you follow up appointment. Call Your Doctor If Any of These Occur Redness, drainage, or swelling at the wound.  Temperature greater than 101 degrees. Severe pain not relieved by pain medication. Incision starts to come apart. Follow Up Appt Call today for appointment in 3-4 weeks ([phone]) or for problems.  If you have any hardware placed in your spine, you will need an x-ray before your appointment.  Home Health PT with [facility] [phone]

## 2011-12-07 NOTE — Progress Notes (Signed)
Pt. Tolerated procedure well.Pt. Alert and oriented,follows simple instructions, denies pain. Incision area without swelling, redness or S/S of infection. Voiding adequate clear yellow urine. Moving all extremities well and vitals stable and documented. Lumbar surgery notes instructions given to patient and family member for home safety and precautions.Pt and family stated understanding of instructions given. 

## 2011-12-07 NOTE — Progress Notes (Signed)
Seen and agreed 12/07/2011 Mansour Balboa [place] PTA [phone] pager [phone] office    

## 2012-03-24 ENCOUNTER — Ambulatory Visit: Payer: Medicare Other | Attending: Neurosurgery | Admitting: Rehabilitation

## 2012-03-24 DIAGNOSIS — M6281 Muscle weakness (generalized): Secondary | ICD-10-CM | POA: Insufficient documentation

## 2012-03-24 DIAGNOSIS — IMO0001 Reserved for inherently not codable concepts without codable children: Secondary | ICD-10-CM | POA: Insufficient documentation

## 2012-03-27 ENCOUNTER — Ambulatory Visit: Payer: Medicare Other | Admitting: Rehabilitation

## 2012-03-31 ENCOUNTER — Ambulatory Visit: Payer: Medicare Other

## 2012-04-03 ENCOUNTER — Ambulatory Visit: Payer: Medicare Other | Admitting: Rehabilitation

## 2012-09-15 IMAGING — CT CT MAXILLOFACIAL W/O CM
3 series · 16 of 47 positions shown, 19 images · non-contrast
Comparison: None.

CLINICAL DATA: 78-year-old male with sinus obstruction.  Prior
surgeries.  Headache, jaw pain, dizziness.

CT MAXILLOFACIAL WITHOUT CONTRAST
TECHNIQUE: Multidetector CT imaging of the maxillofacial
structures was performed. Multiplanar CT image reconstructions were
also generated.

[Series 3: ax soft · axial · 0.28mm/px · z∈[-188,-90]mm · 10 of 57 slices shown, 13 images]
[im 4/57  brain]
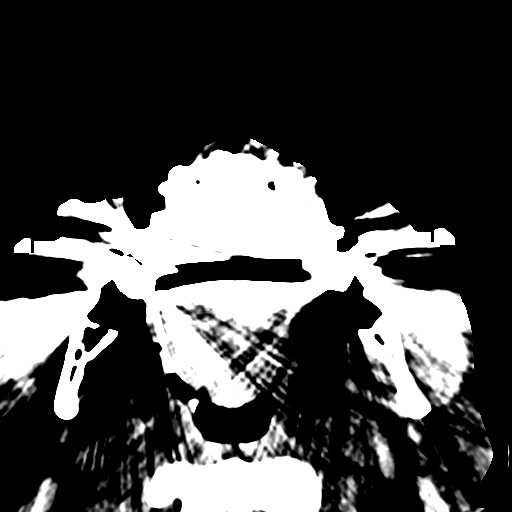
[im 4/57  bone]
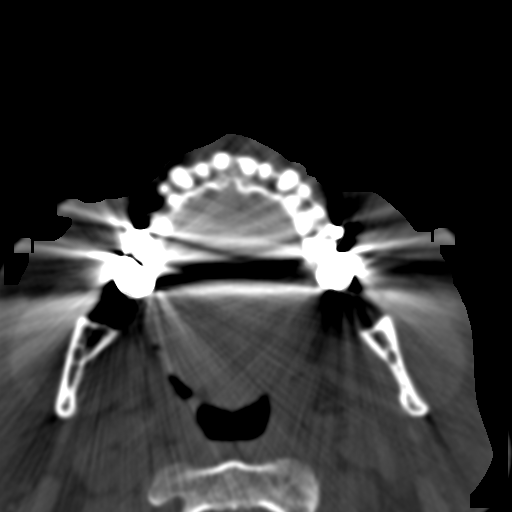
[im 10/57  bone]
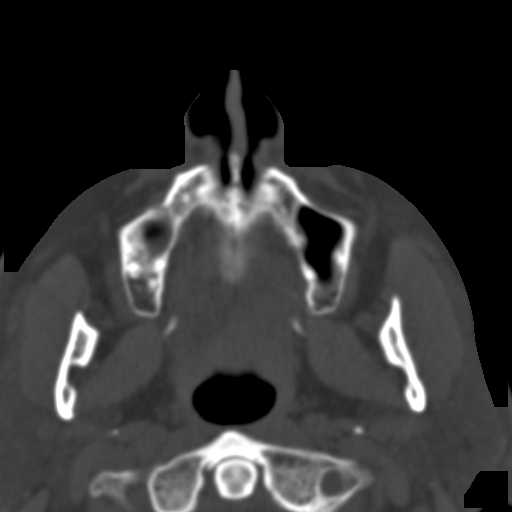
[im 16/57  bone]
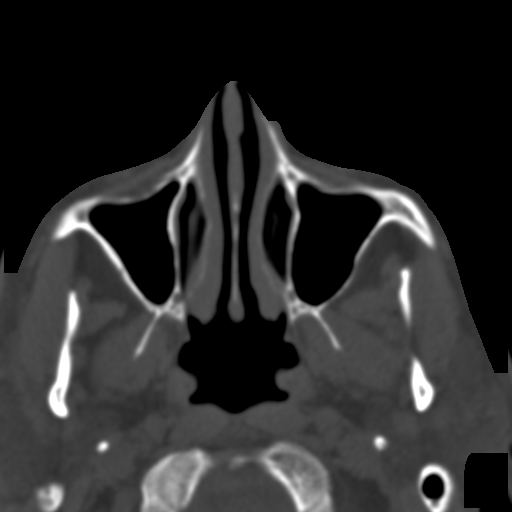
[im 20/57  bone]
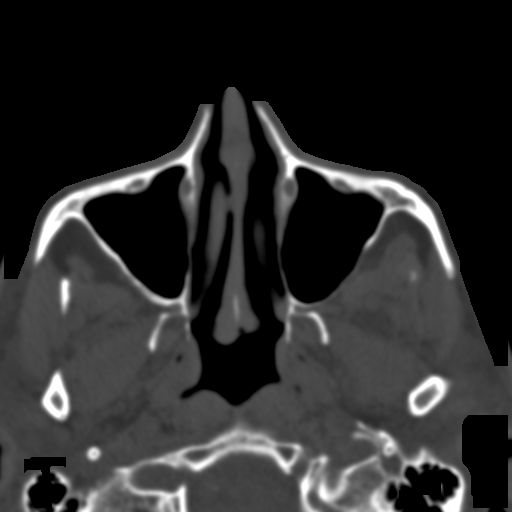
[im 26/57  brain]
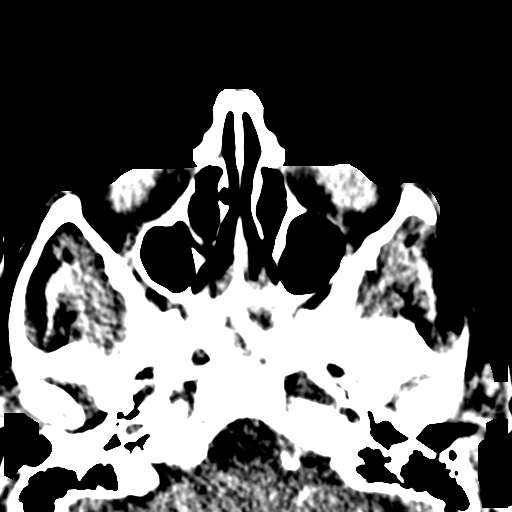
[im 26/57  bone]
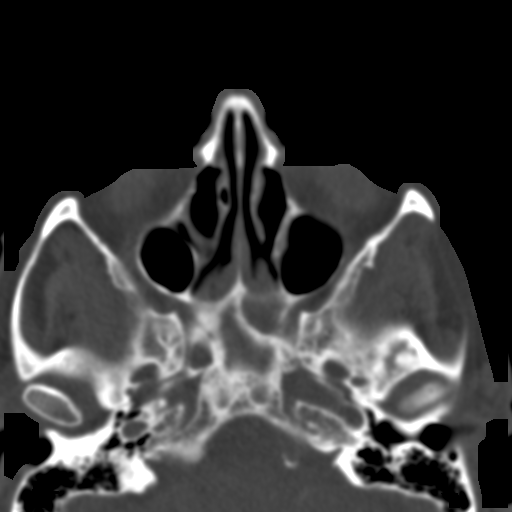
[im 31/57  bone]
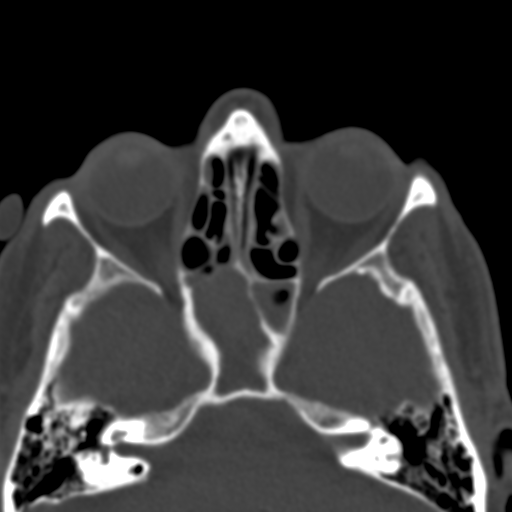
[im 37/57  bone]
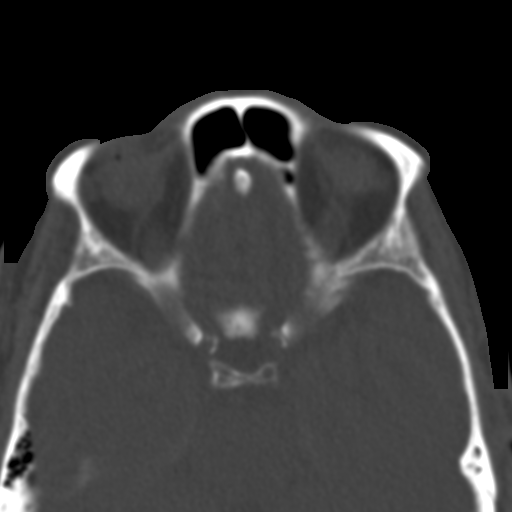
[im 43/57  bone]
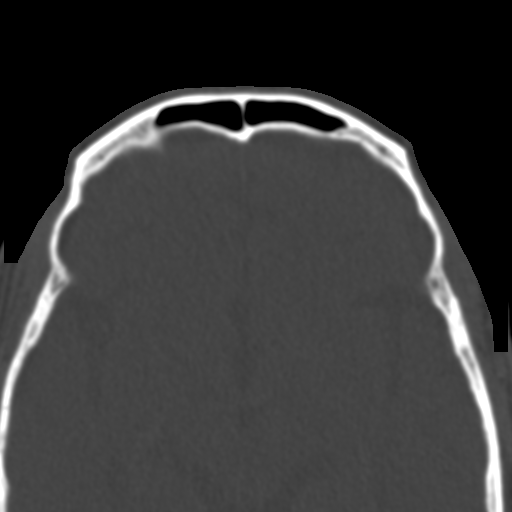
[im 47/57  brain]
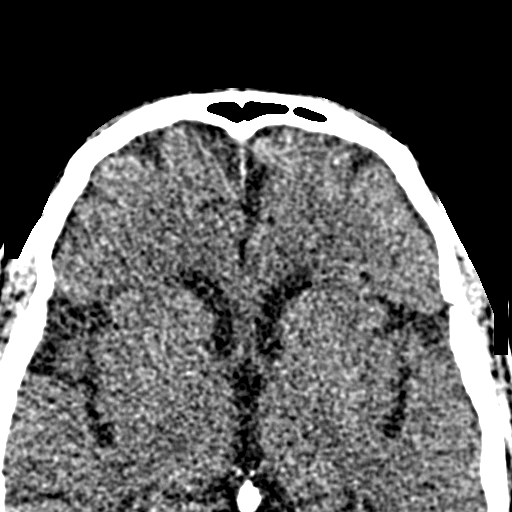
[im 47/57  bone]
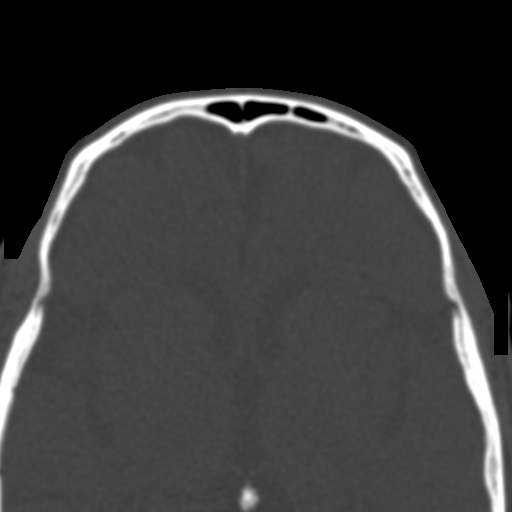
[im 53/57  bone]
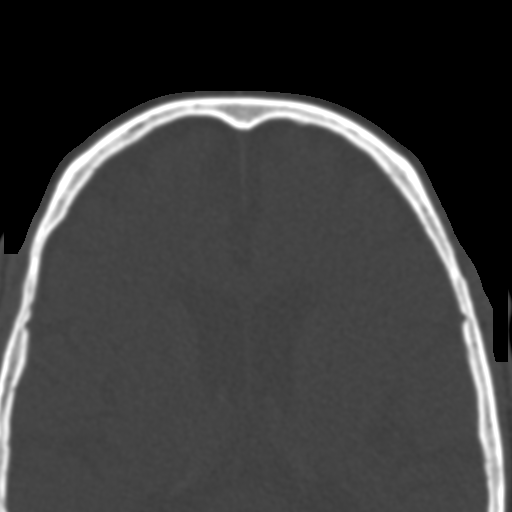

[Series 4: coronal bone · coronal · 0.31mm/px · 3 of 13 slices shown]
[im 5/13  bone]
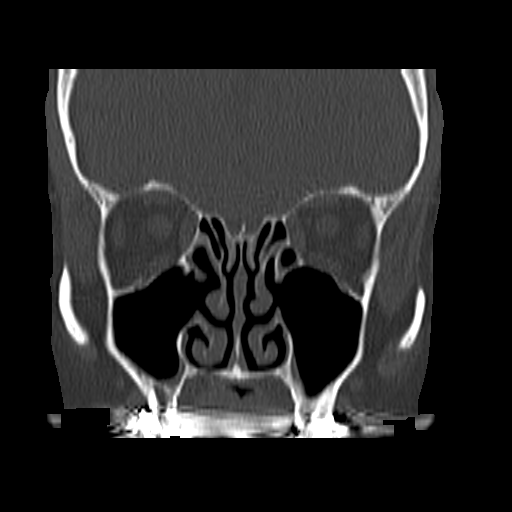
[im 6/13  bone]
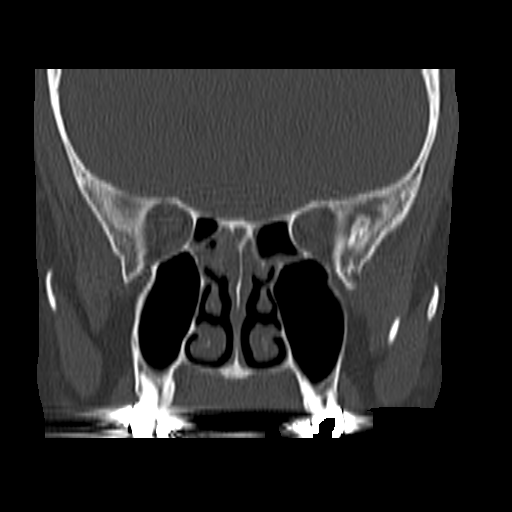
[im 7/13  bone]
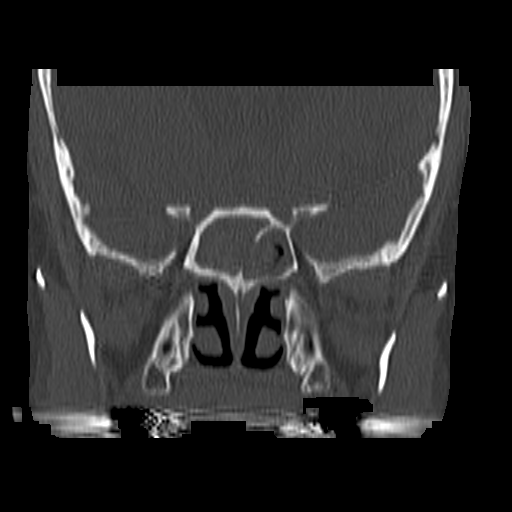

[Series 5: sagittal bone · sagittal · 0.28mm/px · 3 of 15 slices shown]
[im 5/15  bone]
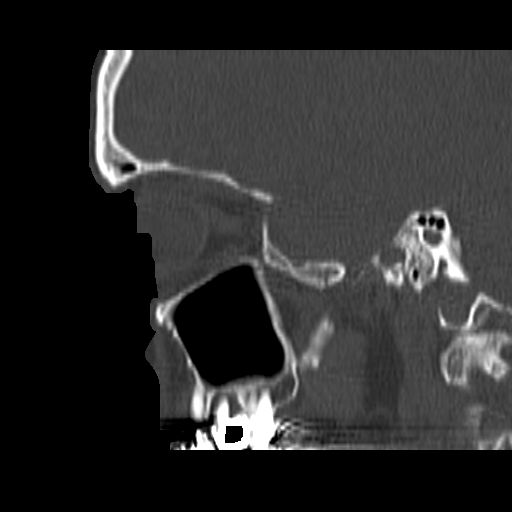
[im 8/15  bone]
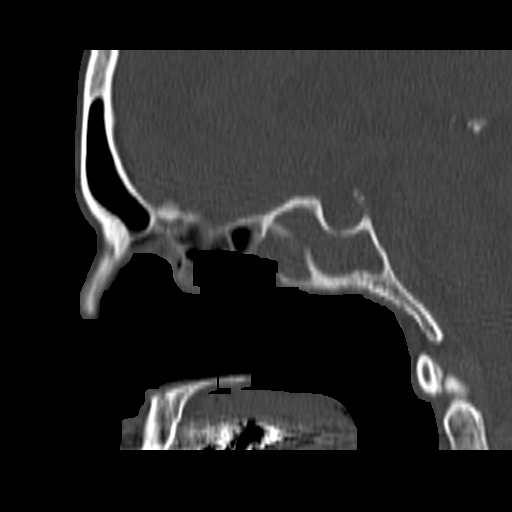
[im 10/15  bone]
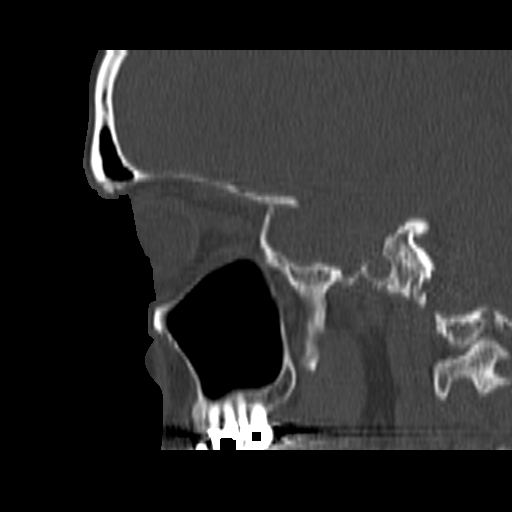

[16 of 47 positions shown; findings below may reference images not displayed]

FINDINGS: Negative for age visualized noncontrast brain parenchyma.
Calcified atherosclerosis at the skull base.  Visualized orbit soft
tissues are within normal limits.  Negative visualized deep soft
tissue spaces of the face.

Tympanic cavities and mastoids are clear.

The sphenoid sinuses are opacified.
There is mild mucosal thickening in the posterior ethmoids.  There
is mild bubbly opacity on the right.
There is mild mucosal thickening in the anterior ethmoids.
There is minor mucosal thickening in the frontal recesses, the
frontal sinuses otherwise are clear.
The maxillary sinuses are clear.  Previous maxillary antrostomies.

No acute osseous abnormality identified.
IMPRESSION: Opacified sphenoid sinuses.  Mild bubbly opacity in the right
posterior ethmoids.  Minimal to mild paranasal sinus mucosal
thickening elsewhere.

## 2013-03-08 ENCOUNTER — Emergency Department (HOSPITAL_BASED_OUTPATIENT_CLINIC_OR_DEPARTMENT_OTHER)
Admission: EM | Admit: 2013-03-08 | Discharge: 2013-03-08 | Disposition: A | Discharge status: Home / Self Care | Payer: Medicare Other | Attending: Emergency Medicine | Admitting: Emergency Medicine

## 2013-03-08 ENCOUNTER — Encounter (HOSPITAL_BASED_OUTPATIENT_CLINIC_OR_DEPARTMENT_OTHER): Payer: Self-pay | Admitting: *Deleted

## 2013-03-08 ENCOUNTER — Emergency Department (HOSPITAL_BASED_OUTPATIENT_CLINIC_OR_DEPARTMENT_OTHER): Payer: Medicare Other

## 2013-03-08 DIAGNOSIS — Z880 Allergy status to penicillin: Secondary | ICD-10-CM | POA: Insufficient documentation

## 2013-03-08 DIAGNOSIS — R142 Eructation: Secondary | ICD-10-CM | POA: Insufficient documentation

## 2013-03-08 DIAGNOSIS — Z85828 Personal history of other malignant neoplasm of skin: Secondary | ICD-10-CM | POA: Insufficient documentation

## 2013-03-08 DIAGNOSIS — R112 Nausea with vomiting, unspecified: Secondary | ICD-10-CM | POA: Insufficient documentation

## 2013-03-08 DIAGNOSIS — K5900 Constipation, unspecified: Secondary | ICD-10-CM | POA: Insufficient documentation

## 2013-03-08 DIAGNOSIS — N400 Enlarged prostate without lower urinary tract symptoms: Secondary | ICD-10-CM | POA: Insufficient documentation

## 2013-03-08 DIAGNOSIS — M549 Dorsalgia, unspecified: Secondary | ICD-10-CM | POA: Insufficient documentation

## 2013-03-08 DIAGNOSIS — E119 Type 2 diabetes mellitus without complications: Secondary | ICD-10-CM | POA: Insufficient documentation

## 2013-03-08 DIAGNOSIS — R141 Gas pain: Secondary | ICD-10-CM | POA: Insufficient documentation

## 2013-03-08 DIAGNOSIS — N23 Unspecified renal colic: Secondary | ICD-10-CM | POA: Insufficient documentation

## 2013-03-08 DIAGNOSIS — Z79899 Other long term (current) drug therapy: Secondary | ICD-10-CM | POA: Insufficient documentation

## 2013-03-08 DIAGNOSIS — K219 Gastro-esophageal reflux disease without esophagitis: Secondary | ICD-10-CM | POA: Insufficient documentation

## 2013-03-08 DIAGNOSIS — Z87442 Personal history of urinary calculi: Secondary | ICD-10-CM | POA: Insufficient documentation

## 2013-03-08 DIAGNOSIS — Z7982 Long term (current) use of aspirin: Secondary | ICD-10-CM | POA: Insufficient documentation

## 2013-03-08 HISTORY — DX: Unspecified malignant neoplasm of skin, unspecified: C44.90

## 2013-03-08 HISTORY — DX: Calculus of kidney: N20.0

## 2013-03-08 LAB — CBC WITH DIFFERENTIAL/PLATELET
Basophils Relative: 0 % (ref 0–1)
Eosinophils Absolute: 0.1 10*3/uL (ref 0.0–0.7)
MCH: 27.1 pg (ref 26.0–34.0)
MCHC: 31.8 g/dL (ref 30.0–36.0)
Neutro Abs: 9.4 10*3/uL — ABNORMAL HIGH (ref 1.7–7.7)
Neutrophils Relative %: 72 % (ref 43–77)
Platelets: 256 10*3/uL (ref 150–400)
RBC: 3.91 MIL/uL — ABNORMAL LOW (ref 4.22–5.81)

## 2013-03-08 LAB — COMPREHENSIVE METABOLIC PANEL
ALT: 22 U/L (ref 0–53)
AST: 21 U/L (ref 0–37)
Albumin: 4.1 g/dL (ref 3.5–5.2)
Alkaline Phosphatase: 88 U/L (ref 39–117)
Chloride: 99 mEq/L (ref 96–112)
Potassium: 4.6 mEq/L (ref 3.5–5.1)
Sodium: 139 mEq/L (ref 135–145)
Total Protein: 7.5 g/dL (ref 6.0–8.3)

## 2013-03-08 LAB — URINALYSIS, ROUTINE W REFLEX MICROSCOPIC
Bilirubin Urine: NEGATIVE
Nitrite: NEGATIVE
Specific Gravity, Urine: 1.026 (ref 1.005–1.030)
Urobilinogen, UA: 0.2 mg/dL (ref 0.0–1.0)
pH: 5 (ref 5.0–8.0)

## 2013-03-08 LAB — URINE MICROSCOPIC-ADD ON

## 2013-03-08 MED ORDER — SODIUM CHLORIDE 0.9 % IV BOLUS (SEPSIS)
500.0000 mL | Freq: Once | INTRAVENOUS | Status: AC
  Administered 2013-03-08: 500 mL via INTRAVENOUS

## 2013-03-08 MED ORDER — ONDANSETRON 4 MG PO TBDP: ORAL_TABLET | ORAL | Status: DC

## 2013-03-08 MED ORDER — MORPHINE SULFATE 4 MG/ML IJ SOLN
4.0000 mg | Freq: Once | INTRAMUSCULAR | Status: AC
  Administered 2013-03-08: 4 mg via INTRAVENOUS
  Filled 2013-03-08: qty 1

## 2013-03-08 MED ORDER — OXYCODONE-ACETAMINOPHEN 5-325 MG PO TABS: 1.0000 | ORAL_TABLET | ORAL | Status: DC | PRN

## 2013-03-08 MED ORDER — KETOROLAC TROMETHAMINE 30 MG/ML IJ SOLN
30.0000 mg | Freq: Once | INTRAMUSCULAR | Status: AC
Start: 2013-03-08 — End: 2013-03-08
  Administered 2013-03-08: 30 mg via INTRAVENOUS
  Filled 2013-03-08: qty 1

## 2013-03-08 MED ORDER — CIPROFLOXACIN HCL 500 MG PO TABS: 500.0000 mg | ORAL_TABLET | Freq: Two times a day (BID) | ORAL | Status: DC

## 2013-03-08 MED ORDER — KETOROLAC TROMETHAMINE 10 MG PO TABS: 10.0000 mg | ORAL_TABLET | Freq: Four times a day (QID) | ORAL | Status: DC | PRN

## 2013-03-08 MED ORDER — TAMSULOSIN HCL 0.4 MG PO CAPS: 0.4000 mg | ORAL_CAPSULE | Freq: Every day | ORAL | Status: DC

## 2013-03-08 MED ORDER — ONDANSETRON HCL 4 MG/2ML IJ SOLN
4.0000 mg | Freq: Once | INTRAMUSCULAR | Status: AC
  Administered 2013-03-08: 4 mg via INTRAVENOUS
  Filled 2013-03-08: qty 2

## 2013-03-08 NOTE — ED Notes (Signed)
Patient transported to CT 

## 2013-03-08 NOTE — Discharge Instructions (Signed)
Call [facility] to set up followup appointment this week. Return immediately to the emergency department for fever, persistent vomiting, worsening pain or for any concerns. Take medication as prescribed.  Ureteral Colic (Kidney Stones) Ureteral colic is the result of a condition when kidney stones form inside the kidney. Once kidney stones are formed they may move into the tube that connects the kidney with the bladder (ureter). If this occurs, this condition may cause pain (colic) in the ureter.  CAUSES  Pain is caused by stone movement in the ureter and the obstruction caused by the stone. SYMPTOMS  The pain comes and goes as the ureter contracts around the stone. The pain is usually intense, sharp, and stabbing in character. The location of the pain may move as the stone moves through the ureter. When the stone is near the kidney the pain is usually located in the back and radiates to the belly (abdomen). When the stone is ready to pass into the bladder the pain is often located in the lower abdomen on the side the stone is located. At this location, the symptoms may mimic those of a urinary tract infection with urinary frequency. Once the stone is located here it often passes into the bladder and the pain disappears completely. TREATMENT   Your caregiver will provide you with medicine for pain relief.  You may require specialized follow-up X-rays.  The absence of pain does not always mean that the stone has passed. It may have just stopped moving. If the urine remains completely obstructed, it can cause loss of kidney function or even complete destruction of the involved kidney. It is your responsibility and in your interest that X-rays and follow-ups as suggested by your caregiver are completed. Relief of pain without passage of the stone can be associated with severe damage to the kidney, including loss of kidney function on that side.  If your stone does not pass on its own, additional  measures may be taken by your caregiver to ensure its removal. HOME CARE INSTRUCTIONS   Increase your fluid intake. Water is the preferred fluid since juices containing vitamin C may acidify the urine making it less likely for certain stones (uric acid stones) to pass.  Strain all urine. A strainer will be provided. Keep all particulate matter or stones for your caregiver to inspect.  Take your pain medicine as directed.  Make a follow-up appointment with your caregiver as directed.  Remember that the goal is passage of your stone. The absence of pain does not mean the stone is gone. Follow your caregiver's instructions.  Only take over-the-counter or prescription medicines for pain, discomfort, or fever as directed by your caregiver. SEEK MEDICAL CARE IF:   Pain cannot be controlled with the prescribed medicine.  You have a fever.  Pain continues for longer than your caregiver advises it should.  There is a change in the pain, and you develop chest discomfort or constant abdominal pain.  You feel faint or pass out. MAKE SURE YOU:   Understand these instructions.  Will watch your condition.  Will get help right away if you are not doing well or get worse. Document Released: 03/07/2005 Document Revised: 08/20/2011 Document Reviewed: 11/22/2010 [facility] Patient Information 2014 [place], [place].

## 2013-03-08 NOTE — ED Provider Notes (Signed)
CSN: [id number]     Arrival date & time 03/08/13  1712 History   First MD Initiated Contact with Patient 03/08/13 1724     Chief Complaint  Patient presents with  . Flank Pain   (Consider location/radiation/quality/duration/timing/severity/associated sxs/prior Treatment) HPI Patient presents with left-sided flank pain for the past 2 days. He's had nausea and vomiting this been episodic since the pain started. He's had decreased bowel movements and abdominal bloating. He denies any fevers chills. He's had no dysuria or hematuria. He has diffuse abdominal discomfort without focality. He has had a previous history of kidney stones and states that the pain feels very similar.  Past Medical History  Diagnosis Date  . Diabetes mellitus   . BPH (benign prostatic hyperplasia)     takes cardura for prostate  . GERD (gastroesophageal reflux disease)   . Kidney stone   . Skin cancer    Past Surgical History  Procedure Laterality Date  . Total shoulder replacement    . Appendectomy    . Mohs surgery     No family history on file. History  Substance Use Topics  . Smoking status: Never Smoker   . Smokeless tobacco: Never Used  . Alcohol Use: No    Review of Systems  Constitutional: Negative for fever and chills.  Respiratory: Negative for shortness of breath.   Cardiovascular: Negative for chest pain.  Gastrointestinal: Positive for nausea, vomiting, abdominal pain, constipation and abdominal distention. Negative for diarrhea.  Genitourinary: Positive for flank pain. Negative for dysuria, frequency and hematuria.  Musculoskeletal: Positive for myalgias and back pain.  Skin: Negative for rash and wound.  Neurological: Negative for dizziness, weakness, light-headedness, numbness and headaches.  All other systems reviewed and are negative.    Allergies  Penicillins  Home Medications   Current Outpatient Rx  Name  Route  Sig  Dispense  Refill  . aspirin EC 81 MG tablet   Oral  Take 81 mg by mouth daily.         . Coenzyme Q10 (CO Q 10 PO)   Oral   Take 1 tablet by mouth daily.         Marland Kitchen doxazosin (CARDURA) 8 MG tablet   Oral   Take 8 mg by mouth at bedtime.          Marland Kitchen glimepiride (AMARYL) 4 MG tablet   Oral   Take 4 mg by mouth daily before breakfast.          . metFORMIN (GLUCOPHAGE) 500 MG tablet   Oral   Take 1,000 mg by mouth 2 (two) times daily with a meal.         . omeprazole (PRILOSEC) 20 MG capsule   Oral   Take 20 mg by mouth daily as needed. heartburn         . rosuvastatin (CRESTOR) 10 MG tablet   Oral   Take 10 mg by mouth 2 (two) times a week.         . zolpidem (AMBIEN) 5 MG tablet   Oral   Take 5 mg by mouth at bedtime as needed. sleep         . Fish Oil OIL   Oral   Take 1 capsule by mouth daily.          BP 154/60  Pulse 69  Temp(Src) 98.6 F (37 C) (Oral)  Resp 20  Ht 5\' 6"  (1.676 m)  Wt 160 lb (72.576 kg)  BMI 25.84 kg/m2  SpO2 99% Physical Exam  Nursing note and vitals reviewed. Constitutional: He is oriented to person, place, and time. He appears well-developed and well-nourished. No distress.  Patient appears mildly uncomfortable  HENT:  Head: Normocephalic and atraumatic.  Mouth/Throat: Oropharynx is clear and moist.  Eyes: EOM are normal. Pupils are equal, round, and reactive to light.  Neck: Normal range of motion. Neck supple.  Cardiovascular: Normal rate and regular rhythm.   Pulmonary/Chest: Effort normal and breath sounds normal. No respiratory distress. He has no wheezes. He has no rales. He exhibits no tenderness.  Abdominal: Soft. Bowel sounds are normal. He exhibits distension. He exhibits no mass. There is tenderness (diffuse tenderness to palpation without focality). There is no rebound and no guarding.  Musculoskeletal: Normal range of motion. He exhibits tenderness (. Palpation over his left flank and left lumbar paraspinal muscles.). He exhibits no edema.  Neurological: He is  alert and oriented to person, place, and time.  Patient is alert and oriented x3 with clear, goal oriented speech. Patient has 5/5 motor in all extremities. Sensation is intact to light touch.    Skin: Skin is warm and dry. No rash noted. No erythema.  Psychiatric: He has a normal mood and affect. His behavior is normal.    ED Course  Procedures (including critical care time) Labs Review Labs Reviewed  URINALYSIS, ROUTINE W REFLEX MICROSCOPIC  CBC WITH DIFFERENTIAL  COMPREHENSIVE METABOLIC PANEL  LIPASE, BLOOD   Imaging Review No results found.  MDM  Patient states pain is similar to previous renal colic. We'll get blood work and CT without contrast. We'll treat and reevaluate.  Patient states he is now symptom-free. Vital signs remained stable. Patient does have bacteria in his urine and elevated white blood cell count. I discussed this with Dr. Marlou Porch. States he would start patient on empiric antibiotics, send cultures and have patient followup in the office next week. Patient has been given thorough discharge instructions return immediately for emergent emergency department for fever, worsening pain, persistent vomiting or any concerns.  Loren Racer, MD 03/08/13 [phone]

## 2013-03-08 NOTE — ED Notes (Signed)
Left flank pain since Friday- n/v intermittently since Friday with gas and bloating

## 2013-03-10 LAB — URINE CULTURE: Colony Count: 40000

## 2013-05-22 ENCOUNTER — Ambulatory Visit (INDEPENDENT_AMBULATORY_CARE_PROVIDER_SITE_OTHER): Payer: Medicare Other | Admitting: Cardiology

## 2013-05-22 VITALS — BP 118/66 | HR 78 | Ht 65.5 in | Wt 157.0 lb

## 2013-05-22 DIAGNOSIS — R0609 Other forms of dyspnea: Secondary | ICD-10-CM

## 2013-05-22 DIAGNOSIS — R0600 Dyspnea, unspecified: Secondary | ICD-10-CM

## 2013-05-22 DIAGNOSIS — E785 Hyperlipidemia, unspecified: Secondary | ICD-10-CM | POA: Insufficient documentation

## 2013-05-22 NOTE — Assessment & Plan Note (Signed)
Given diabetes mellitus for approximately 25 years am concerned about anginal equivalent. Plan stress Myoview for risk stratification. Schedule echocardiogram to assess LV systolic and diastolic function.

## 2013-05-22 NOTE — Progress Notes (Signed)
HPI: 79 year old male for evaluation of dyspnea. No prior cardiac history. For the past 3 months he notes dyspnea on exertion relieved with rest. No orthopnea, PND, pedal edema, palpitations, syncope, chest pain or claudication.  Current Outpatient Prescriptions  Medication Sig Dispense Refill  . aspirin EC 81 MG tablet Take 81 mg by mouth daily.      . ciprofloxacin (CIPRO) 500 MG tablet Take 1 tablet (500 mg total) by mouth 2 (two) times daily. One po bid x 7 days  14 tablet  0  . Coenzyme Q10 (CO Q 10 PO) Take 1 tablet by mouth daily.      Marland Kitchen doxazosin (CARDURA) 8 MG tablet Take 8 mg by mouth at bedtime.       . Fish Oil OIL Take 1 capsule by mouth daily.      Marland Kitchen glimepiride (AMARYL) 4 MG tablet Take 4 mg by mouth daily before breakfast.       . ketorolac (TORADOL) 10 MG tablet Take 1 tablet (10 mg total) by mouth every 6 (six) hours as needed for pain.  20 tablet  0  . metFORMIN (GLUCOPHAGE) 500 MG tablet Take 1,000 mg by mouth 2 (two) times daily with a meal.      . omeprazole (PRILOSEC) 20 MG capsule Take 20 mg by mouth daily as needed. heartburn      . ondansetron (ZOFRAN ODT) 4 MG disintegrating tablet 4mg  ODT q4 hours prn nausea/vomit  8 tablet  0  . oxyCODONE-acetaminophen (PERCOCET) 5-325 MG per tablet Take 1 tablet by mouth every 4 (four) hours as needed for pain.  15 tablet  0  . rosuvastatin (CRESTOR) 10 MG tablet Take 10 mg by mouth 2 (two) times a week.      . tamsulosin (FLOMAX) 0.4 MG CAPS capsule Take 1 capsule (0.4 mg total) by mouth daily.  30 capsule  0  . zolpidem (AMBIEN) 5 MG tablet Take 5 mg by mouth at bedtime as needed. sleep       No current facility-administered medications for this visit.    Allergies  Allergen Reactions  . Penicillins     REACTION: swelling in joints    Past Medical History  Diagnosis Date  . Diabetes mellitus   . BPH (benign prostatic hyperplasia)     With obstruction/lower urinary tract symptoms. Takes cardura for prostate.   Marland Kitchen  GERD (gastroesophageal reflux disease)   . Kidney stone   . Skin cancer   . Mixed hyperlipidemia   . Allergic rhinitis   . History of urinary calculi   . Incidental pulmonary nodule     Past Surgical History  Procedure Laterality Date  . Total shoulder replacement    . Appendectomy    . Mohs surgery    . Back surgery      History   Social History  . Marital Status: Married    Spouse Name: N/A    Number of Children: 1  . Years of Education: N/A   Occupational History  . Not on file.   Social History Main Topics  . Smoking status: Never Smoker   . Smokeless tobacco: Never Used  . Alcohol Use: No  . Drug Use: No  . Sexual Activity: Not on file   Other Topics Concern  . Not on file   Social History Narrative  . No narrative on file    Family History  Problem Relation Age of Onset  . Heart attack Father   .  CAD Father     ROS: no fevers or chills, productive cough, hemoptysis, dysphasia, odynophagia, melena, hematochezia, dysuria, hematuria, rash, seizure activity, orthopnea, PND, pedal edema, claudication. Remaining systems are negative.  Physical Exam:   Blood pressure 118/66, pulse 78, height 5' 5.5" (1.664 m), weight 157 lb (71.215 kg).  General:  Well developed/well nourished in NAD Skin warm/dry Patient not depressed No peripheral clubbing Back-normal HEENT-normal/normal eyelids Neck supple/normal carotid upstroke bilaterally; no bruits; no JVD; no thyromegaly chest - CTA/ normal expansion CV - RRR/normal S1 and S2; no murmurs, rubs or gallops;  PMI nondisplaced Abdomen -NT/ND, no HSM, no mass, + bowel sounds, no bruit 2+ femoral pulses, no bruits Ext-no edema, chords, 2+ DP Neuro-grossly nonfocal  ECG NSR, RBBB, left axis deviation

## 2013-05-22 NOTE — Patient Instructions (Signed)
Your physician recommends that you schedule a follow-up appointment in: 6 WEEKS WITH DR Jens Som  Your physician has requested that you have en exercise stress myoview. For further information please visit [email or web address] Please follow instruction sheet, as given.   Your physician has requested that you have an echocardiogram. Echocardiography is a painless test that uses sound waves to create images of your heart. It provides your doctor with information about the size and shape of your heart and how well your heart's chambers and valves are working. This procedure takes approximately one hour. There are no restrictions for this procedure.

## 2013-05-22 NOTE — Assessment & Plan Note (Signed)
Continue statin. 

## 2013-06-16 ENCOUNTER — Telehealth: Payer: Self-pay | Admitting: Cardiology

## 2013-06-16 NOTE — Telephone Encounter (Signed)
Spoke with pt wife, the pt is scheduled for 12:30 pm. Explained he could eat before 8:30 am. If he eats he should take his diabetic meds, if he does not eat he does not need to take them. Pt wife voiced understanding.

## 2013-06-16 NOTE — Telephone Encounter (Signed)
New message     Pt needs to know if he needs to take his diabetic medicine before stress test.

## 2013-06-17 ENCOUNTER — Encounter: Payer: Self-pay | Admitting: Cardiovascular Disease

## 2013-06-17 ENCOUNTER — Ambulatory Visit (HOSPITAL_COMMUNITY): Payer: Medicare HMO | Attending: Cardiovascular Disease | Admitting: Radiology

## 2013-06-17 ENCOUNTER — Ambulatory Visit (HOSPITAL_BASED_OUTPATIENT_CLINIC_OR_DEPARTMENT_OTHER): Payer: Medicare HMO | Admitting: Radiology

## 2013-06-17 VITALS — BP 129/80 | HR 72 | Ht 66.0 in | Wt 157.0 lb

## 2013-06-17 DIAGNOSIS — R0989 Other specified symptoms and signs involving the circulatory and respiratory systems: Principal | ICD-10-CM | POA: Insufficient documentation

## 2013-06-17 DIAGNOSIS — E119 Type 2 diabetes mellitus without complications: Secondary | ICD-10-CM

## 2013-06-17 DIAGNOSIS — R0602 Shortness of breath: Secondary | ICD-10-CM

## 2013-06-17 DIAGNOSIS — R42 Dizziness and giddiness: Secondary | ICD-10-CM | POA: Insufficient documentation

## 2013-06-17 DIAGNOSIS — R0609 Other forms of dyspnea: Secondary | ICD-10-CM

## 2013-06-17 DIAGNOSIS — R0600 Dyspnea, unspecified: Secondary | ICD-10-CM

## 2013-06-17 MED ORDER — TECHNETIUM TC 99M SESTAMIBI GENERIC - CARDIOLITE
10.0000 | Freq: Once | INTRAVENOUS | Status: AC | PRN
  Administered 2013-06-17: 10 via INTRAVENOUS

## 2013-06-17 MED ORDER — TECHNETIUM TC 99M SESTAMIBI GENERIC - CARDIOLITE
30.0000 | Freq: Once | INTRAVENOUS | Status: AC | PRN
  Administered 2013-06-17: 30 via INTRAVENOUS

## 2013-06-17 NOTE — Progress Notes (Signed)
Echocardiogram performed.  

## 2013-06-17 NOTE — Progress Notes (Signed)
[facility]UCLE[facility] [address] [place], [place] [address] [phone]    Cardiology Nuclear Med Study  Hayden Garza is a 80 y.o. male     MRN : [id number]     DOB: April 06, 1933  Procedure Date: 06/17/2013  Nuclear Med Background Indication for Stress Test:  Evaluation for Ischemia History:  No HX of CAD  Cardiac Risk Factors: Family History - CAD, Lipids and NIDDM  Symptoms:  Dizziness and DOE   Nuclear Pre-Procedure Caffeine/Decaff Intake:  None NPO After: 8:00pm   Lungs:  clear O2 Sat: 97% on room air. IV 0.9% NS with Angio Cath:  22g  IV Site: R Antecubital  IV Started by:  Perrin Maltese, EMT-P  Chest Size (in):  42 Cup Size: n/a  Height: 5\' 6"  (1.676 m)  Weight:  157 lb (71.215 kg)  BMI:  Body mass index is 25.35 kg/(m^2). Tech Comments:  Took Diabetic Rx am    Nuclear Med Study 1 or 2 day study: 1 day  Stress Test Type:  Stress  Reading MD: n/a  Order Authorizing Provider:  Kirk Ruths, MD  Resting Radionuclide: Technetium 76m Sestamibi  Resting Radionuclide Dose: 11.0 mCi   Stress Radionuclide:  Technetium 45m Sestamibi  Stress Radionuclide Dose: 33.0 mCi           Stress Protocol Rest HR: 72 Stress HR: 129  Rest BP: 129/80 Stress BP: 195/70  Exercise Time (min): 4:00 METS: 4.6           Dose of Adenosine (mg):  n/a Dose of Lexiscan: n/a mg  Dose of Atropine (mg): n/a Dose of Dobutamine: n/a mcg/kg/min (at max HR)  Stress Test Technologist: Glade Lloyd, BS-ES  Nuclear Technologist:  Charlton Amor, CNMT     Rest Procedure:  Myocardial perfusion imaging was performed at rest 45 minutes following the intravenous administration of Technetium 73m Sestamibi. Rest ECG: NSR-RBBB  Stress Procedure:  The patient exercised on the treadmill utilizing the Bruce Protocol for 4:00 minutes. The patient stopped due to SOB and denied any chest pain.  Technetium 61m Sestamibi was injected at peak exercise and myocardial perfusion imaging  was performed after a brief delay. Stress ECG: No significant change from baseline ECG  QPS Raw Data Images:  Normal; no motion artifact; normal heart/lung ratio. Stress Images:  Normal homogeneous uptake in all areas of the myocardium. Rest Images:  Normal homogeneous uptake in all areas of the myocardium. Subtraction (SDS):  Normal Transient Ischemic Dilatation (Normal <1.22):  0.90 Lung/Heart Ratio (Normal <0.45):  0.29  Quantitative Gated Spect Images QGS EDV:  97 ml QGS ESV:  36 ml  Impression Exercise Capacity:  Poor exercise capacity. BP Response:  Normal blood pressure response. Clinical Symptoms:  There is dyspnea. ECG Impression:  No significant ST segment change suggestive of ischemia. Comparison with Prior Nuclear Study: No images to compare  Overall Impression:  Normal stress nuclear study.  LV Ejection Fraction: 63%.  LV Wall Motion:  NL LV Function; NL Wall Motion  Hayden Garza

## 2013-06-30 ENCOUNTER — Ambulatory Visit: Payer: Medicare Other | Admitting: Cardiology

## 2013-07-08 ENCOUNTER — Encounter: Payer: Self-pay | Admitting: Cardiology

## 2013-07-08 ENCOUNTER — Ambulatory Visit (INDEPENDENT_AMBULATORY_CARE_PROVIDER_SITE_OTHER): Payer: Medicare HMO | Admitting: Cardiology

## 2013-07-08 VITALS — BP 118/70 | HR 76 | Ht 65.0 in | Wt 164.0 lb

## 2013-07-08 DIAGNOSIS — R0989 Other specified symptoms and signs involving the circulatory and respiratory systems: Secondary | ICD-10-CM

## 2013-07-08 DIAGNOSIS — E785 Hyperlipidemia, unspecified: Secondary | ICD-10-CM

## 2013-07-08 DIAGNOSIS — R0609 Other forms of dyspnea: Secondary | ICD-10-CM

## 2013-07-08 DIAGNOSIS — R0600 Dyspnea, unspecified: Secondary | ICD-10-CM

## 2013-07-08 NOTE — Progress Notes (Signed)
      HPI: followup dyspnea. Initially seen in December of 2014. Echocardiogram in January 2015 showed normal LV function, grade 2 diastolic dysfunction, mild left atrial enlargement and mild mitral regurgitation. Nuclear study in January of 2015 showed an ejection fraction of 63% and normal perfusion. Since that last saw him, he has dyspnea with more extreme activities. Not routine activities. No orthopnea, PND or pedal edema. No chest pain.   Current Outpatient Prescriptions  Medication Sig Dispense Refill  . aspirin EC 81 MG tablet Take 81 mg by mouth daily.      . Coenzyme Q10 (CO Q 10 PO) Take 1 tablet by mouth daily.      Marland Kitchen doxazosin (CARDURA) 8 MG tablet Take 8 mg by mouth at bedtime.       Marland Kitchen glimepiride (AMARYL) 4 MG tablet Take 4 mg by mouth daily before breakfast.       . metFORMIN (GLUCOPHAGE) 500 MG tablet Take 1,000 mg by mouth 2 (two) times daily with a meal.      . omeprazole (PRILOSEC) 20 MG capsule Take 20 mg by mouth daily as needed. heartburn      . rosuvastatin (CRESTOR) 10 MG tablet Take 10 mg by mouth 2 (two) times a week.      . tamsulosin (FLOMAX) 0.4 MG CAPS capsule Take 1 capsule (0.4 mg total) by mouth daily.  30 capsule  0  . zolpidem (AMBIEN) 5 MG tablet Take 5 mg by mouth at bedtime as needed. sleep       No current facility-administered medications for this visit.     Past Medical History  Diagnosis Date  . Diabetes mellitus   . BPH (benign prostatic hyperplasia)     With obstruction/lower urinary tract symptoms. Takes cardura for prostate.   Marland Kitchen GERD (gastroesophageal reflux disease)   . Kidney stone   . Skin cancer   . Mixed hyperlipidemia   . Allergic rhinitis   . History of urinary calculi   . Incidental pulmonary nodule     Past Surgical History  Procedure Laterality Date  . Total shoulder replacement    . Appendectomy    . Mohs surgery    . Back surgery      History   Social History  . Marital Status: Married    Spouse Name: N/A   Number of Children: 1  . Years of Education: N/A   Occupational History  . Not on file.   Social History Main Topics  . Smoking status: Never Smoker   . Smokeless tobacco: Never Used  . Alcohol Use: No  . Drug Use: No  . Sexual Activity: Not on file   Other Topics Concern  . Not on file   Social History Narrative  . No narrative on file    ROS: no fevers or chills, productive cough, hemoptysis, dysphasia, odynophagia, melena, hematochezia, dysuria, hematuria, rash, seizure activity, orthopnea, PND, pedal edema, claudication. Remaining systems are negative.  Physical Exam: Well-developed well-nourished in no acute distress.  Skin is warm and dry.  HEENT is normal.  Neck is supple.  Chest is clear to auscultation with normal expansion.  Cardiovascular exam is regular rate and rhythm.  Abdominal exam nontender or distended. No masses palpated. Extremities show no edema. neuro grossly intact

## 2013-07-08 NOTE — Assessment & Plan Note (Signed)
Continue statin. 

## 2013-07-08 NOTE — Assessment & Plan Note (Signed)
Etiology remains unclear. LV function is normal. No ischemia on nuclear study. His symptoms only occur with more vigorous activities. We discussed options today including continued medical therapy versus cardiac catheterization for definitive evaluation. We will plan medical therapy at this point unless his symptoms worsen. He is in agreement. Continue aspirin and statin.

## 2013-07-08 NOTE — Patient Instructions (Signed)
Your physician wants you to follow-up in: [place] will receive a reminder letter in the mail two months in advance. If you don't receive a letter, please call our office to schedule the follow-up appointment.

## 2014-04-13 ENCOUNTER — Other Ambulatory Visit (HOSPITAL_COMMUNITY): Payer: Self-pay | Admitting: Orthopedic Surgery

## 2014-04-13 DIAGNOSIS — M25511 Pain in right shoulder: Secondary | ICD-10-CM

## 2014-04-21 ENCOUNTER — Encounter (HOSPITAL_COMMUNITY)
Admission: RE | Admit: 2014-04-21 | Discharge: 2014-04-21 | Disposition: A | Discharge status: Home / Self Care | Payer: Medicare HMO | Source: Ambulatory Visit | Attending: Orthopedic Surgery | Admitting: Orthopedic Surgery

## 2014-04-21 ENCOUNTER — Ambulatory Visit (HOSPITAL_COMMUNITY)
Admission: RE | Admit: 2014-04-21 | Discharge: 2014-04-21 | Disposition: A | Discharge status: Home / Self Care | Payer: Medicare HMO | Source: Ambulatory Visit | Attending: Diagnostic Radiology | Admitting: Diagnostic Radiology

## 2014-04-21 DIAGNOSIS — Z96611 Presence of right artificial shoulder joint: Secondary | ICD-10-CM | POA: Insufficient documentation

## 2014-04-21 DIAGNOSIS — Y838 Other surgical procedures as the cause of abnormal reaction of the patient, or of later complication, without mention of misadventure at the time of the procedure: Secondary | ICD-10-CM | POA: Diagnosis not present

## 2014-04-21 DIAGNOSIS — M25511 Pain in right shoulder: Secondary | ICD-10-CM | POA: Diagnosis present

## 2014-04-21 DIAGNOSIS — T8484XA Pain due to internal orthopedic prosthetic devices, implants and grafts, initial encounter: Secondary | ICD-10-CM | POA: Diagnosis not present

## 2014-04-21 MED ORDER — TECHNETIUM TC 99M MEDRONATE IV KIT
26.0000 | PACK | Freq: Once | INTRAVENOUS | Status: AC | PRN
  Administered 2014-04-21: 26 via INTRAVENOUS

## 2014-06-24 DIAGNOSIS — E119 Type 2 diabetes mellitus without complications: Secondary | ICD-10-CM | POA: Diagnosis not present

## 2014-07-05 DIAGNOSIS — Z96611 Presence of right artificial shoulder joint: Secondary | ICD-10-CM | POA: Diagnosis not present

## 2014-07-05 DIAGNOSIS — M25511 Pain in right shoulder: Secondary | ICD-10-CM | POA: Diagnosis not present

## 2014-07-05 DIAGNOSIS — M7542 Impingement syndrome of left shoulder: Secondary | ICD-10-CM | POA: Diagnosis not present

## 2014-07-05 DIAGNOSIS — T8489XD Other specified complication of internal orthopedic prosthetic devices, implants and grafts, subsequent encounter: Secondary | ICD-10-CM | POA: Diagnosis not present

## 2014-10-04 DIAGNOSIS — N401 Enlarged prostate with lower urinary tract symptoms: Secondary | ICD-10-CM | POA: Diagnosis not present

## 2014-10-04 DIAGNOSIS — K219 Gastro-esophageal reflux disease without esophagitis: Secondary | ICD-10-CM | POA: Diagnosis not present

## 2014-10-04 DIAGNOSIS — M25511 Pain in right shoulder: Secondary | ICD-10-CM | POA: Diagnosis not present

## 2014-10-04 DIAGNOSIS — E782 Mixed hyperlipidemia: Secondary | ICD-10-CM | POA: Diagnosis not present

## 2014-10-04 DIAGNOSIS — E119 Type 2 diabetes mellitus without complications: Secondary | ICD-10-CM | POA: Diagnosis not present

## 2014-10-04 DIAGNOSIS — F5101 Primary insomnia: Secondary | ICD-10-CM | POA: Diagnosis not present

## 2014-11-01 DIAGNOSIS — T1512XA Foreign body in conjunctival sac, left eye, initial encounter: Secondary | ICD-10-CM | POA: Diagnosis not present

## 2014-11-15 DIAGNOSIS — H5210 Myopia, unspecified eye: Secondary | ICD-10-CM | POA: Diagnosis not present

## 2014-11-15 DIAGNOSIS — E109 Type 1 diabetes mellitus without complications: Secondary | ICD-10-CM | POA: Diagnosis not present

## 2014-11-15 DIAGNOSIS — E119 Type 2 diabetes mellitus without complications: Secondary | ICD-10-CM | POA: Diagnosis not present

## 2014-11-15 DIAGNOSIS — H52209 Unspecified astigmatism, unspecified eye: Secondary | ICD-10-CM | POA: Diagnosis not present

## 2014-11-22 DIAGNOSIS — Z85828 Personal history of other malignant neoplasm of skin: Secondary | ICD-10-CM | POA: Diagnosis not present

## 2014-11-22 DIAGNOSIS — L570 Actinic keratosis: Secondary | ICD-10-CM | POA: Diagnosis not present

## 2014-11-22 DIAGNOSIS — L821 Other seborrheic keratosis: Secondary | ICD-10-CM | POA: Diagnosis not present

## 2014-11-22 DIAGNOSIS — Z08 Encounter for follow-up examination after completed treatment for malignant neoplasm: Secondary | ICD-10-CM | POA: Diagnosis not present

## 2015-03-08 DIAGNOSIS — Z23 Encounter for immunization: Secondary | ICD-10-CM | POA: Diagnosis not present

## 2015-03-28 DIAGNOSIS — E119 Type 2 diabetes mellitus without complications: Secondary | ICD-10-CM | POA: Diagnosis not present

## 2015-03-28 DIAGNOSIS — M25511 Pain in right shoulder: Secondary | ICD-10-CM | POA: Diagnosis not present

## 2015-03-28 DIAGNOSIS — N401 Enlarged prostate with lower urinary tract symptoms: Secondary | ICD-10-CM | POA: Diagnosis not present

## 2015-03-29 DIAGNOSIS — E119 Type 2 diabetes mellitus without complications: Secondary | ICD-10-CM | POA: Diagnosis not present

## 2015-04-04 DIAGNOSIS — Z87442 Personal history of urinary calculi: Secondary | ICD-10-CM | POA: Diagnosis not present

## 2015-04-04 DIAGNOSIS — N401 Enlarged prostate with lower urinary tract symptoms: Secondary | ICD-10-CM | POA: Diagnosis not present

## 2015-04-04 DIAGNOSIS — N138 Other obstructive and reflux uropathy: Secondary | ICD-10-CM | POA: Diagnosis not present

## 2015-04-30 ENCOUNTER — Encounter (HOSPITAL_COMMUNITY): Payer: Self-pay | Admitting: Emergency Medicine

## 2015-04-30 ENCOUNTER — Emergency Department (HOSPITAL_COMMUNITY): Payer: Commercial Managed Care - HMO

## 2015-04-30 ENCOUNTER — Inpatient Hospital Stay (HOSPITAL_COMMUNITY)
Admission: EM | Admit: 2015-04-30 | Discharge: 2015-05-03 | DRG: 247 | Disposition: A | Discharge status: Home / Self Care | Payer: Commercial Managed Care - HMO | Attending: Cardiology | Admitting: Cardiology

## 2015-04-30 DIAGNOSIS — E119 Type 2 diabetes mellitus without complications: Secondary | ICD-10-CM

## 2015-04-30 DIAGNOSIS — E782 Mixed hyperlipidemia: Secondary | ICD-10-CM | POA: Diagnosis present

## 2015-04-30 DIAGNOSIS — I214 Non-ST elevation (NSTEMI) myocardial infarction: Secondary | ICD-10-CM | POA: Diagnosis not present

## 2015-04-30 DIAGNOSIS — D509 Iron deficiency anemia, unspecified: Secondary | ICD-10-CM | POA: Diagnosis present

## 2015-04-30 DIAGNOSIS — K219 Gastro-esophageal reflux disease without esophagitis: Secondary | ICD-10-CM | POA: Diagnosis not present

## 2015-04-30 DIAGNOSIS — Z880 Allergy status to penicillin: Secondary | ICD-10-CM

## 2015-04-30 DIAGNOSIS — Z79899 Other long term (current) drug therapy: Secondary | ICD-10-CM | POA: Diagnosis not present

## 2015-04-30 DIAGNOSIS — Z7984 Long term (current) use of oral hypoglycemic drugs: Secondary | ICD-10-CM | POA: Diagnosis not present

## 2015-04-30 DIAGNOSIS — Z8249 Family history of ischemic heart disease and other diseases of the circulatory system: Secondary | ICD-10-CM | POA: Diagnosis not present

## 2015-04-30 DIAGNOSIS — Z85828 Personal history of other malignant neoplasm of skin: Secondary | ICD-10-CM

## 2015-04-30 DIAGNOSIS — R911 Solitary pulmonary nodule: Secondary | ICD-10-CM | POA: Diagnosis present

## 2015-04-30 DIAGNOSIS — E785 Hyperlipidemia, unspecified: Secondary | ICD-10-CM | POA: Diagnosis present

## 2015-04-30 DIAGNOSIS — R0789 Other chest pain: Secondary | ICD-10-CM | POA: Diagnosis not present

## 2015-04-30 DIAGNOSIS — J309 Allergic rhinitis, unspecified: Secondary | ICD-10-CM | POA: Diagnosis not present

## 2015-04-30 DIAGNOSIS — N400 Enlarged prostate without lower urinary tract symptoms: Secondary | ICD-10-CM | POA: Diagnosis present

## 2015-04-30 DIAGNOSIS — I252 Old myocardial infarction: Secondary | ICD-10-CM | POA: Diagnosis present

## 2015-04-30 DIAGNOSIS — I4510 Unspecified right bundle-branch block: Secondary | ICD-10-CM | POA: Diagnosis present

## 2015-04-30 DIAGNOSIS — R079 Chest pain, unspecified: Secondary | ICD-10-CM | POA: Diagnosis not present

## 2015-04-30 DIAGNOSIS — I200 Unstable angina: Secondary | ICD-10-CM | POA: Diagnosis present

## 2015-04-30 DIAGNOSIS — Z96619 Presence of unspecified artificial shoulder joint: Secondary | ICD-10-CM | POA: Diagnosis present

## 2015-04-30 DIAGNOSIS — I2510 Atherosclerotic heart disease of native coronary artery without angina pectoris: Secondary | ICD-10-CM | POA: Diagnosis not present

## 2015-04-30 HISTORY — DX: Atherosclerotic heart disease of native coronary artery without angina pectoris: I25.10

## 2015-04-30 LAB — BASIC METABOLIC PANEL
ANION GAP: 11 (ref 5–15)
BUN: 16 mg/dL (ref 6–20)
CALCIUM: 9.3 mg/dL (ref 8.9–10.3)
CO2: 22 mmol/L (ref 22–32)
CREATININE: 1.06 mg/dL (ref 0.61–1.24)
Chloride: 105 mmol/L (ref 101–111)
GFR calc Af Amer: >60 mL/min (ref >60)
GLUCOSE: 177 mg/dL — AB (ref 65–99)
Potassium: 4.2 mmol/L (ref 3.5–5.1)
Sodium: 138 mmol/L (ref 135–145)

## 2015-04-30 LAB — I-STAT CHEM 8, ED
BUN: 18 mg/dL (ref 6–20)
CALCIUM ION: 1.23 mmol/L (ref 1.13–1.30)
CREATININE: 0.9 mg/dL (ref 0.61–1.24)
Chloride: 103 mmol/L (ref 101–111)
GLUCOSE: 177 mg/dL — AB (ref 65–99)
HCT: 34 % — ABNORMAL LOW (ref 39.0–52.0)
HEMOGLOBIN: 11.6 g/dL — AB (ref 13.0–17.0)
POTASSIUM: 4.3 mmol/L (ref 3.5–5.1)
Sodium: 142 mmol/L (ref 135–145)
TCO2: 22 mmol/L (ref 0–100)

## 2015-04-30 LAB — I-STAT TROPONIN, ED
TROPONIN I, POC: 0.04 ng/mL (ref 0.00–0.08)
TROPONIN I, POC: 0.13 ng/mL — AB (ref 0.00–0.08)

## 2015-04-30 LAB — CBC
HCT: 31.3 % — ABNORMAL LOW (ref 39.0–52.0)
HEMOGLOBIN: 9.8 g/dL — AB (ref 13.0–17.0)
MCH: 26.6 pg (ref 26.0–34.0)
MCHC: 31.3 g/dL (ref 30.0–36.0)
MCV: 85.1 fL (ref 78.0–100.0)
PLATELETS: 252 10*3/uL (ref 150–400)
RBC: 3.68 MIL/uL — ABNORMAL LOW (ref 4.22–5.81)
RDW: 15.8 % — AB (ref 11.5–15.5)
WBC: 7.8 10*3/uL (ref 4.0–10.5)

## 2015-04-30 LAB — GLUCOSE, CAPILLARY
GLUCOSE-CAPILLARY: 207 mg/dL — AB (ref 65–99)
Glucose-Capillary: 179 mg/dL — ABNORMAL HIGH (ref 65–99)

## 2015-04-30 LAB — HEPARIN LEVEL (UNFRACTIONATED): Heparin Unfractionated: 0.31 IU/mL (ref 0.30–0.70)

## 2015-04-30 LAB — TROPONIN I
TROPONIN I: 0.13 ng/mL — AB (ref <0.031)
Troponin I: 1.24 ng/mL (ref <0.031)

## 2015-04-30 MED ORDER — HEPARIN (PORCINE) IN NACL 100-0.45 UNIT/ML-% IJ SOLN
1100.0000 [IU]/h | INTRAMUSCULAR | Status: DC
  Administered 2015-04-30: 1000 [IU]/h via INTRAVENOUS
  Administered 2015-05-01 – 2015-05-02 (×2): 1100 [IU]/h via INTRAVENOUS
  Filled 2015-04-30 (×3): qty 250

## 2015-04-30 MED ORDER — CHOLECALCIFEROL 25 MCG (1000 UT) PO CAPS: 1000.0000 [IU] | ORAL_CAPSULE | Freq: Every day | ORAL | Status: DC

## 2015-04-30 MED ORDER — PANTOPRAZOLE SODIUM 40 MG PO TBEC
40.0000 mg | DELAYED_RELEASE_TABLET | Freq: Every day | ORAL | Status: DC
  Administered 2015-05-01 – 2015-05-03 (×3): 40 mg via ORAL
  Filled 2015-04-30 (×3): qty 1

## 2015-04-30 MED ORDER — VITAMIN D 1000 UNITS PO TABS
1000.0000 [IU] | ORAL_TABLET | Freq: Every day | ORAL | Status: DC
  Administered 2015-05-01 – 2015-05-02 (×2): 1000 [IU] via ORAL
  Filled 2015-04-30 (×2): qty 1

## 2015-04-30 MED ORDER — GLIMEPIRIDE 4 MG PO TABS
4.0000 mg | ORAL_TABLET | Freq: Every day | ORAL | Status: DC
  Administered 2015-05-01 – 2015-05-03 (×2): 4 mg via ORAL
  Filled 2015-04-30 (×2): qty 1

## 2015-04-30 MED ORDER — NITROGLYCERIN 2 % TD OINT
0.5000 [in_us] | TOPICAL_OINTMENT | Freq: Four times a day (QID) | TRANSDERMAL | Status: DC
  Administered 2015-04-30: 0.5 [in_us] via TOPICAL

## 2015-04-30 MED ORDER — PANTOPRAZOLE SODIUM 40 MG PO TBEC: 40.0000 mg | DELAYED_RELEASE_TABLET | Freq: Every day | ORAL | Status: DC

## 2015-04-30 MED ORDER — DOXAZOSIN MESYLATE 8 MG PO TABS
8.0000 mg | ORAL_TABLET | Freq: Every day | ORAL | Status: DC
  Administered 2015-04-30 – 2015-05-02 (×2): 8 mg via ORAL
  Filled 2015-04-30: qty 1
  Filled 2015-04-30: qty 2
  Filled 2015-04-30 (×2): qty 1
  Filled 2015-04-30: qty 2
  Filled 2015-04-30 (×2): qty 1

## 2015-04-30 MED ORDER — ZOLPIDEM TARTRATE 5 MG PO TABS
5.0000 mg | ORAL_TABLET | Freq: Every evening | ORAL | Status: DC | PRN
  Administered 2015-04-30 – 2015-05-02 (×3): 5 mg via ORAL
  Filled 2015-04-30 (×3): qty 1

## 2015-04-30 MED ORDER — NITROGLYCERIN IN D5W 200-5 MCG/ML-% IV SOLN
0.0000 ug/min | INTRAVENOUS | Status: DC
  Administered 2015-04-30: 5 ug/min via INTRAVENOUS
  Filled 2015-04-30: qty 250

## 2015-04-30 MED ORDER — HEPARIN BOLUS VIA INFUSION
4000.0000 [IU] | Freq: Once | INTRAVENOUS | Status: AC
  Administered 2015-04-30: 4000 [IU] via INTRAVENOUS
  Filled 2015-04-30: qty 4000

## 2015-04-30 MED ORDER — METOPROLOL TARTRATE 12.5 MG HALF TABLET
12.5000 mg | ORAL_TABLET | Freq: Four times a day (QID) | ORAL | Status: DC
  Administered 2015-04-30 – 2015-05-03 (×8): 12.5 mg via ORAL
  Filled 2015-04-30 (×9): qty 1

## 2015-04-30 NOTE — ED Notes (Signed)
MD Beaton at bedside. 

## 2015-04-30 NOTE — ED Notes (Signed)
Pt ambulatory to restroom

## 2015-04-30 NOTE — ED Notes (Signed)
Pt here from [facility] via EMS with CPO starting 0930 today 10/10. Pt reports that he was watching TV when he felt the pressure. EMS gave 324 ASA and 1 nitro PTA. Pt now rates pain 0/10. CBG 168. NAD noted.

## 2015-04-30 NOTE — H&P (Signed)
History and Physical   Patient ID: Hayden Garza MRN: [id number], DOB/AGE: 1932/12/19 81 y.o. Date of Encounter: 04/30/2015  Primary Physician: Abigail Miyamoto, MD Primary Cardiologist: New  Chief Complaint:  Chest pain  HPI: Hayden Garza is a 81 y.o. male with a history of DM and dyslipidemia, and FH premature CAD, no hx HTN.  He was in his usual state of health today when he had onset of SSCP while sitting in a chair.  He laid down, no change. Took no meds. The pain began radiating down both arms and was severe, 10/10. Pressure. Never had before. No associated symptoms.   He called 911 and chewed ASA 81 mg x 4 as requested. Got SL NTG x 1 by EMS, the pain decreased to a 1/10. Currently having chest tightness at a 1/10.   Recently has noticed increasing DOE. No chest pain but will have to stop and rest, then continue. Has been able to walk a flight of stairs without stopping as recently as yesterday.  Not aware of anemia and has no history of dark or tarry stools. Not aware of any blood loss or problems. About 20 years ago, was anemic and had colonoscopy, that was OK.   Past Medical History  Diagnosis Date  . Diabetes mellitus   . BPH (benign prostatic hyperplasia)     With obstruction/lower urinary tract symptoms. Takes cardura for prostate.   Marland Kitchen GERD (gastroesophageal reflux disease)   . Kidney stone   . Skin cancer   . Mixed hyperlipidemia   . Allergic rhinitis   . History of urinary calculi   . Incidental pulmonary nodule     Surgical History:  Past Surgical History  Procedure Laterality Date  . Total shoulder replacement    . Appendectomy    . Mohs surgery    . Back surgery       I have reviewed the patient's current medications. Prior to Admission medications   Medication Sig Start Date End Date Taking? Authorizing Provider  Cholecalciferol 1000 UNITS capsule Take 1,000 Units by mouth daily.   Yes Historical Provider, MD  Coenzyme Q10 (CO Q 10 PO)  Take 1 tablet by mouth daily.   Yes Historical Provider, MD  doxazosin (CARDURA) 8 MG tablet Take 8 mg by mouth at bedtime.    Yes Historical Provider, MD  glimepiride (AMARYL) 4 MG tablet Take 4 mg by mouth daily before breakfast.    Yes Historical Provider, MD  metFORMIN (GLUCOPHAGE) 500 MG tablet Take 1,000 mg by mouth 2 (two) times daily with a meal.   Yes Historical Provider, MD  omeprazole (PRILOSEC) 20 MG capsule Take 20 mg by mouth daily as needed. heartburn   Yes Historical Provider, MD  zolpidem (AMBIEN) 5 MG tablet Take 5 mg by mouth at bedtime as needed. sleep   Yes Historical Provider, MD   Allergies:  Allergies  Allergen Reactions  . Lipitor [Atorvastatin]     Joint pain  . Penicillins     REACTION: swelling in joints    Social History   Social History  . Marital Status: Married    Spouse Name: N/A  . Number of Children: 1  . Years of Education: N/A   Occupational History  . Retired    Social History Main Topics  . Smoking status: Never Smoker   . Smokeless tobacco: Never Used  . Alcohol Use: No  . Drug Use: No  . Sexual Activity: Not on file  Other Topics Concern  . Not on file   Social History Narrative   Lives in [place], [place] with wife.    Family History  Problem Relation Age of Onset  . Heart attack Father   . CAD Father   . Coronary artery disease Brother     died in his 3s after a fall  . Coronary artery disease Brother     dx mid-60s   Family Status  Relation Status Death Age  . Mother Deceased 71  . Father Deceased 18    MI  . Sister Deceased 6 months    colitis  . Brother Alive   . Maternal Grandmother Deceased 38    Died in fire  . Maternal Grandfather Deceased   . Paternal Grandmother Deceased   . Paternal Grandfather Deceased   . Brother Deceased 39    Fell/hit back of head    Review of Systems:   Full 14-point review of systems otherwise negative except as noted above.  Physical Exam: Blood pressure 124/74, pulse 61,  temperature 98 F (36.7 C), temperature source Oral, resp. rate 18, height 5' 5.5" (1.664 m), weight 153 lb (69.4 kg), SpO2 99 %. General: Well developed, well nourished,male in no acute distress. Head: Normocephalic, atraumatic, sclera non-icteric, no xanthomas, nares are without discharge. Dentition: good Neck: No carotid bruits. JVD not elevated. No thyromegally Lungs: Good expansion bilaterally. without wheezes or rhonchi.  Heart: Regular rate and rhythm with S1 S2.  No S3 or S4.  No murmur, no rubs, or gallops appreciated. Abdomen: Soft, non-tender, non-distended with normoactive bowel sounds. No hepatomegaly. No rebound/guarding. No obvious abdominal masses. Msk:  Strength and tone appear normal for age. No joint deformities or effusions, no spine or costo-vertebral angle tenderness. Extremities: No clubbing or cyanosis. No edema.  Distal pedal pulses are 2+ in 4 extrem Neuro: Alert and oriented X 3. Moves all extremities spontaneously. No focal deficits noted. Psych:  Responds to questions appropriately with a normal affect. Skin: No rashes or lesions noted  Labs:   Lab Results  Component Value Date   WBC 7.8 04/30/2015   HGB 9.8* 04/30/2015   HCT 31.3* 04/30/2015   MCV 85.1 04/30/2015   PLT 252 04/30/2015     Recent Labs Lab 04/30/15 1158  NA 138  K 4.2  CL 105  CO2 22  BUN 16  CREATININE 1.06  CALCIUM 9.3  GLUCOSE 177*   No results for input(s): CKTOTAL, CKMB, TROPONINI in the last 72 hours.  Recent Labs  04/30/15 1144 04/30/15 1433  TROPIPOC 0.04 0.13*    Radiology/Studies: Dg Chest Portable 1 View 04/30/2015  CLINICAL DATA:  Midline chest pain beginning this morning. History of diabetes. EXAM: PORTABLE CHEST 1 VIEW COMPARISON:  02/24/2010 FINDINGS: Cardiac silhouette normal in size and configuration. No mediastinal or hilar masses or evidence of adenopathy. Mild elevation of the right hemidiaphragm, stable. Lungs are clear.  No pleural effusion or  pneumothorax. 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 Bony thorax is demineralized but grossly intact. IMPRESSION: No acute cardiopulmonary disease. Electronically Signed   By: Lajean Manes M.D.   On: 04/30/2015 11:49   Echo: 06/17/2013 - Left ventricle: The cavity size was normal. There was mild concentric hypertrophy. Systolic function was normal. The estimated ejection fraction was in the range of 55% to 60%. Wall motion was normal; there were no regional wall motion abnormalities. Features are consistent with a pseudonormal left ventricular filling pattern, with concomitant abnormal relaxation and increased filling pressure (  grade 2 diastolic dysfunction). - Mitral valve: Mild regurgitation. - Left atrium: The atrium was mildly dilated.  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  ASSESSMENT AND PLAN:  Principal Problem:   NSTEMI (non-ST elevated myocardial infarction) ([facility]) - admit, cycle enzymes - add NTG paste and heparin - think cath best option, The risks and benefits of a cardiac catheterization including, but not limited to, death, stroke, MI, kidney damage and bleeding were discussed with the patient who indicates understanding and agrees to proceed.   Active Problems:   Diabetes mellitus type 2, noninsulin dependent ([facility]) - hold metformin - add SSI, ck A1c    Hyperlipidemia - ck profile in am - problems with Lipitor, would try a weaker statin and see how tolerated    Anemia - guaiac stools - ck iron levels  Signed, Lenoard Aden 04/30/2015 3:52 PM Beeper [phone]  Patient seen with PA, agree with the above note.  No prior cardiac problems but strong family history.  He has type II diabetes.  First episode of chest pain he's had was this morning, lasted about 2 hours and resolved with NTG from EMS.  ECG with NSR, RBBB (no prior).  TnI 0.13.  Suspect NSTEMI.  Currently CP-free. - Admit to telemetry.   - Will start  heparin gtt, ASA, and statin.  - metoprolol 12.5 q6 hrs - NTG paste. - If remains stable, LHC on Monday.  Discussed with patient.   Loralie Champagne 04/30/2015 4:02 PM

## 2015-04-30 NOTE — ED Provider Notes (Signed)
CSN: [id number]     Arrival date & time 04/30/15  1115 History   First MD Initiated Contact with Patient 04/30/15 1116     Chief Complaint  Patient presents with  . Chest Pain      HPI Pt here [facility] via [facility] with CPO starting 0930 today 10/10. Pt reports that he was watching TV when he felt the pressure. EMS gave 324 ASA and 1 nitro PTA. Pt now rates pain 0/10. CBG 168. NAD noted. Past Medical History  Diagnosis Date  . Diabetes mellitus   . BPH (benign prostatic hyperplasia)     With obstruction/lower urinary tract symptoms. Takes cardura for prostate.   Marland Kitchen GERD (gastroesophageal reflux disease)   . Kidney stone   . Skin cancer   . Mixed hyperlipidemia   . Allergic rhinitis   . History of urinary calculi   . Incidental pulmonary nodule    Past Surgical History  Procedure Laterality Date  . Total shoulder replacement    . Appendectomy    . Mohs surgery    . Back surgery     Family History  Problem Relation Age of Onset  . Heart attack Father   . CAD Father    Social History  Substance Use Topics  . Smoking status: Never Smoker   . Smokeless tobacco: Never Used  . Alcohol Use: No    Review of Systems  All other systems reviewed and are negative.     Allergies  Penicillins  Home Medications   Prior to Admission medications   Medication Sig Start Date End Date Taking? Authorizing Provider  Cholecalciferol 1000 UNITS capsule Take 1,000 Units by mouth daily.   Yes Historical Provider, MD  Coenzyme Q10 (CO Q 10 PO) Take 1 tablet by mouth daily.   Yes Historical Provider, MD  doxazosin (CARDURA) 8 MG tablet Take 8 mg by mouth at bedtime.    Yes Historical Provider, MD  glimepiride (AMARYL) 4 MG tablet Take 4 mg by mouth daily before breakfast.    Yes Historical Provider, MD  metFORMIN (GLUCOPHAGE) 500 MG tablet Take 1,000 mg by mouth 2 (two) times daily with a meal.   Yes Historical Provider, MD  omeprazole (PRILOSEC) 20 MG capsule Take 20 mg by mouth daily  as needed. heartburn   Yes Historical Provider, MD  zolpidem (AMBIEN) 5 MG tablet Take 5 mg by mouth at bedtime as needed. sleep   Yes Historical Provider, MD   BP 124/74 mmHg  Pulse 61  Temp(Src) 98 F (36.7 C) (Oral)  Resp 18  Ht 5' 5.5" (1.664 m)  Wt 153 lb (69.4 kg)  BMI 25.06 kg/m2  SpO2 99% Physical Exam  Constitutional: He is oriented to person, place, and time. He appears well-developed and well-nourished. No distress.  HENT:  Head: Normocephalic and atraumatic.  Eyes: Pupils are equal, round, and reactive to light.  Neck: Normal range of motion.  Cardiovascular: Normal rate and intact distal pulses.   Pulmonary/Chest: No respiratory distress. He has no wheezes. He has no rales.  Abdominal: Normal appearance. He exhibits no distension. There is no tenderness. There is no rebound.  Musculoskeletal: Normal range of motion.  Neurological: He is alert and oriented to person, place, and time. No cranial nerve deficit.  Skin: Skin is warm and dry. No rash noted.  Psychiatric: He has a normal mood and affect. His behavior is normal.  Nursing note and vitals reviewed.   ED Course  Procedures (including critical care time)  Labs Review Labs Reviewed  BASIC METABOLIC PANEL - Abnormal; Notable for the following:    Glucose, Bld 177 (*)    All other components within normal limits  CBC - Abnormal; Notable for the following:    RBC 3.68 (*)    Hemoglobin 9.8 (*)    HCT 31.3 (*)    RDW 15.8 (*)    All other components within normal limits  I-STAT CHEM 8, ED - Abnormal; Notable for the following:    Glucose, Bld 177 (*)    Hemoglobin 11.6 (*)    HCT 34.0 (*)    All other components within normal limits  I-STAT TROPOININ, ED - Abnormal; Notable for the following:    Troponin i, poc 0.13 (*)    All other components within normal limits  I-STAT TROPOININ, ED  I-STAT TROPOININ, ED    Imaging Review Dg Chest Portable 1 View  04/30/2015  CLINICAL DATA:  Midline chest pain  beginning this morning. History of diabetes. EXAM: PORTABLE CHEST 1 VIEW COMPARISON:  02/24/2010 FINDINGS: Cardiac silhouette normal in size and configuration. No mediastinal or hilar masses or evidence of adenopathy. Mild elevation of the right hemidiaphragm, stable. Lungs are clear.  No pleural effusion or pneumothorax. 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 Bony thorax is demineralized but grossly intact. IMPRESSION: No acute cardiopulmonary disease. Electronically Signed   By: Lajean Manes M.D.   On: 04/30/2015 11:49   I have personally reviewed and evaluated these images and lab results as part of my medical decision-making.   EKG Interpretation   Date/Time:  Saturday April 30 2015 11:21:12 EST Ventricular Rate:  60 PR Interval:    QRS Duration: 141 QT Interval:  429 QTC Calculation: 429 R Axis:   -34 Text Interpretation:  Junctional rhythm Right bundle branch block Inferior  infarct, old Baseline wander in lead(s) V1 No significant change since  last tracing Confirmed by Shemaiah Round  MD, Ivionna Verley ([id number]) on 04/30/2015  11:33:09 AM     I discussed the case with cardiology.  Patient was started on heparin will be admitted to the hospital.  CRITICAL CARE Performed by: Leonard Schwartz L Total critical care time: 30 min minutes Critical care time was exclusive of separately billable procedures and treating other patients. Critical care was necessary to treat or prevent imminent or life-threatening deterioration. Critical care was time spent personally by me on the following activities: development of treatment plan with patient and/or surrogate as well as nursing, discussions with consultants, evaluation of patient's response to treatment, examination of patient, obtaining history from patient or surrogate, ordering and performing treatments and interventions, ordering and review of laboratory studies, ordering and review of radiographic studies, pulse oximetry and re-evaluation of  patient's condition.  MDM   Final diagnoses:  NSTEMI (non-ST elevated myocardial infarction) ([facility])        Leonard Schwartz, MD 04/30/15 [phone]

## 2015-04-30 NOTE — ED Notes (Signed)
MD at bedside. 

## 2015-04-30 NOTE — Progress Notes (Addendum)
ANTICOAGULATION CONSULT NOTE - Initial Consult  Pharmacy Consult for Heparin Indication: chest pain/ACS  Allergies  Allergen Reactions  . Lipitor [Atorvastatin]     Joint pain  . Penicillins     REACTION: swelling in joints    Patient Measurements: Height: 5' 5.5" (166.4 cm) Weight: 153 lb (69.4 kg) IBW/kg (Calculated) : 62.65   Vital Signs: Temp: 98 F (36.7 C) (11/19 1123) Temp Source: Oral (11/19 1123) BP: 124/74 mmHg (11/19 1445) Pulse Rate: 61 (11/19 1445)  Labs:  Recent Labs  04/30/15 1146 04/30/15 1158  HGB 11.6* 9.8*  HCT 34.0* 31.3*  PLT  --  252  CREATININE 0.90 1.06    Estimated Creatinine Clearance: 49.3 mL/min (by C-G formula based on Cr of 1.06).   Medical History: Past Medical History  Diagnosis Date  . Diabetes mellitus   . BPH (benign prostatic hyperplasia)     With obstruction/lower urinary tract symptoms. Takes cardura for prostate.   Marland Kitchen GERD (gastroesophageal reflux disease)   . Kidney stone   . Skin cancer   . Mixed hyperlipidemia   . Allergic rhinitis   . History of urinary calculi   . Incidental pulmonary nodule     Assessment: 81 year old male admitted with chest pain Pharmacy asked to start heparin  Goal of Therapy:  Heparin level 0.3-0.7 units/ml Monitor platelets by anticoagulation protocol: Yes   Plan:  Heparin 4000 units iv bolus x 1 Heparin drip at 1000 units / hr Heparin level 6 hours after heparin starts Daily heparin level, CBC  Thank you Anette Guarneri, PharmD [phone]  04/30/2015,3:20 PM  Initial heparin level = 0.31  Plan: Increase heparin to 1100 units / hr Next level in AM  Thank you Anette Guarneri, PharmD

## 2015-05-01 ENCOUNTER — Inpatient Hospital Stay (HOSPITAL_COMMUNITY): Payer: Commercial Managed Care - HMO

## 2015-05-01 DIAGNOSIS — R079 Chest pain, unspecified: Secondary | ICD-10-CM

## 2015-05-01 LAB — CBC
HCT: 29.6 % — ABNORMAL LOW (ref 39.0–52.0)
Hemoglobin: 9.3 g/dL — ABNORMAL LOW (ref 13.0–17.0)
MCH: 26.1 pg (ref 26.0–34.0)
MCHC: 31.4 g/dL (ref 30.0–36.0)
MCV: 83.1 fL (ref 78.0–100.0)
PLATELETS: 238 10*3/uL (ref 150–400)
RBC: 3.56 MIL/uL — AB (ref 4.22–5.81)
RDW: 15.6 % — ABNORMAL HIGH (ref 11.5–15.5)
WBC: 8.9 10*3/uL (ref 4.0–10.5)

## 2015-05-01 LAB — COMPREHENSIVE METABOLIC PANEL
ALK PHOS: 63 U/L (ref 38–126)
ALT: 22 U/L (ref 17–63)
AST: 40 U/L (ref 15–41)
Albumin: 3.2 g/dL — ABNORMAL LOW (ref 3.5–5.0)
Anion gap: 7 (ref 5–15)
BUN: 16 mg/dL (ref 6–20)
CALCIUM: 8.9 mg/dL (ref 8.9–10.3)
CHLORIDE: 108 mmol/L (ref 101–111)
CO2: 26 mmol/L (ref 22–32)
CREATININE: 1.09 mg/dL (ref 0.61–1.24)
GFR calc Af Amer: >60 mL/min (ref >60)
Glucose, Bld: 127 mg/dL — ABNORMAL HIGH (ref 65–99)
Potassium: 4.1 mmol/L (ref 3.5–5.1)
Sodium: 141 mmol/L (ref 135–145)
Total Bilirubin: 0.2 mg/dL — ABNORMAL LOW (ref 0.3–1.2)
Total Protein: 6.5 g/dL (ref 6.5–8.1)

## 2015-05-01 LAB — GLUCOSE, CAPILLARY
GLUCOSE-CAPILLARY: 163 mg/dL — AB (ref 65–99)
Glucose-Capillary: 148 mg/dL — ABNORMAL HIGH (ref 65–99)
Glucose-Capillary: 163 mg/dL — ABNORMAL HIGH (ref 65–99)
Glucose-Capillary: 192 mg/dL — ABNORMAL HIGH (ref 65–99)

## 2015-05-01 LAB — LIPID PANEL
CHOL/HDL RATIO: 4.7 ratio
CHOLESTEROL: 132 mg/dL (ref 0–200)
HDL: 28 mg/dL — ABNORMAL LOW (ref >40)
LDL Cholesterol: 89 mg/dL (ref 0–99)
TRIGLYCERIDES: 77 mg/dL (ref <150)
VLDL: 15 mg/dL (ref 0–40)

## 2015-05-01 LAB — FERRITIN: FERRITIN: 7 ng/mL — AB (ref 24–336)

## 2015-05-01 LAB — FOLATE: Folate: 19.5 ng/mL (ref >5.9)

## 2015-05-01 LAB — RETICULOCYTES
RBC.: 3.51 MIL/uL — ABNORMAL LOW (ref 4.22–5.81)
RETIC CT PCT: 1.1 % (ref 0.4–3.1)
Retic Count, Absolute: 38.6 10*3/uL (ref 19.0–186.0)

## 2015-05-01 LAB — MAGNESIUM: Magnesium: 1.7 mg/dL (ref 1.7–2.4)

## 2015-05-01 LAB — TSH: TSH: 1.551 u[IU]/mL (ref 0.350–4.500)

## 2015-05-01 LAB — VITAMIN B12: VITAMIN B 12: 209 pg/mL (ref 180–914)

## 2015-05-01 LAB — IRON AND TIBC
IRON: 31 ug/dL — AB (ref 45–182)
SATURATION RATIOS: 9 % — AB (ref 17.9–39.5)
TIBC: 335 ug/dL (ref 250–450)
UIBC: 304 ug/dL

## 2015-05-01 LAB — HEPARIN LEVEL (UNFRACTIONATED): Heparin Unfractionated: 0.45 IU/mL (ref 0.30–0.70)

## 2015-05-01 LAB — TROPONIN I: TROPONIN I: 4.74 ng/mL — AB (ref <0.031)

## 2015-05-01 LAB — PROTIME-INR
INR: 1.13 (ref 0.00–1.49)
Prothrombin Time: 14.7 seconds (ref 11.6–15.2)

## 2015-05-01 MED ORDER — ALPRAZOLAM 0.25 MG PO TABS
0.2500 mg | ORAL_TABLET | Freq: Two times a day (BID) | ORAL | Status: DC | PRN
Start: 2015-05-01 — End: 2015-05-03

## 2015-05-01 MED ORDER — SODIUM CHLORIDE 0.9 % WEIGHT BASED INFUSION
3.0000 mL/kg/h | INTRAVENOUS | Status: DC
  Administered 2015-05-02: 3 mL/kg/h via INTRAVENOUS

## 2015-05-01 MED ORDER — SODIUM CHLORIDE 0.9 % IV SOLN: 250.0000 mL | INTRAVENOUS | Status: DC | PRN

## 2015-05-01 MED ORDER — ACETAMINOPHEN 325 MG PO TABS: 650.0000 mg | ORAL_TABLET | ORAL | Status: DC | PRN

## 2015-05-01 MED ORDER — SODIUM CHLORIDE 0.9 % IJ SOLN
3.0000 mL | Freq: Two times a day (BID) | INTRAMUSCULAR | Status: DC
  Administered 2015-05-01: 3 mL via INTRAVENOUS

## 2015-05-01 MED ORDER — INSULIN ASPART 100 UNIT/ML ~~LOC~~ SOLN: 0.0000 [IU] | Freq: Every day | SUBCUTANEOUS | Status: DC

## 2015-05-01 MED ORDER — INSULIN ASPART 100 UNIT/ML ~~LOC~~ SOLN
0.0000 [IU] | Freq: Three times a day (TID) | SUBCUTANEOUS | Status: DC
  Administered 2015-05-01: 3 [IU] via SUBCUTANEOUS
  Administered 2015-05-01: 2 [IU] via SUBCUTANEOUS
  Administered 2015-05-01: 3 [IU] via SUBCUTANEOUS
  Administered 2015-05-03: 2 [IU] via SUBCUTANEOUS

## 2015-05-01 MED ORDER — NITROGLYCERIN 0.4 MG SL SUBL: 0.4000 mg | SUBLINGUAL_TABLET | SUBLINGUAL | Status: DC | PRN

## 2015-05-01 MED ORDER — ZOLPIDEM TARTRATE 5 MG PO TABS: 5.0000 mg | ORAL_TABLET | Freq: Every evening | ORAL | Status: DC | PRN

## 2015-05-01 MED ORDER — SODIUM CHLORIDE 0.9 % WEIGHT BASED INFUSION: 1.0000 mL/kg/h | INTRAVENOUS | Status: DC

## 2015-05-01 MED ORDER — ASPIRIN 81 MG PO CHEW
324.0000 mg | CHEWABLE_TABLET | ORAL | Status: AC
  Administered 2015-05-02: 324 mg via ORAL
  Filled 2015-05-01: qty 4

## 2015-05-01 MED ORDER — ONDANSETRON HCL 4 MG/2ML IJ SOLN: 4.0000 mg | Freq: Four times a day (QID) | INTRAMUSCULAR | Status: DC | PRN

## 2015-05-01 MED ORDER — SODIUM CHLORIDE 0.9 % IJ SOLN: 3.0000 mL | INTRAMUSCULAR | Status: DC | PRN

## 2015-05-01 MED ORDER — ASPIRIN EC 81 MG PO TBEC
81.0000 mg | DELAYED_RELEASE_TABLET | Freq: Every day | ORAL | Status: DC
  Administered 2015-05-02 – 2015-05-03 (×2): 81 mg via ORAL
  Filled 2015-05-01 (×3): qty 1

## 2015-05-01 NOTE — Progress Notes (Signed)
ANTICOAGULATION CONSULT NOTE - Follow Up Consult  Pharmacy Consult for heparin Indication: chest pain/ACS  Allergies  Allergen Reactions  . Lipitor [Atorvastatin]     Joint pain  . Penicillins     REACTION: swelling in joints    Patient Measurements: Height: 5' 5.5" (166.4 cm) Weight: 149 lb 4 oz (67.7 kg) IBW/kg (Calculated) : 62.65  Vital Signs: Temp: 98.7 F (37.1 C) (11/20 0430) Temp Source: Oral (11/20 0430) BP: 98/61 mmHg (11/20 0430) Pulse Rate: 72 (11/20 0430)  Labs:  Recent Labs  04/30/15 1146 04/30/15 1158 04/30/15 1431 04/30/15 2125 04/30/15 2156 05/01/15 0339  HGB 11.6* 9.8*  --   --   --  9.3*  HCT 34.0* 31.3*  --   --   --  29.6*  PLT  --  252  --   --   --  238  LABPROT  --   --   --   --   --  14.7  INR  --   --   --   --   --  1.13  HEPARINUNFRC  --   --   --   --  0.31 0.45  CREATININE 0.90 1.06  --   --   --  1.09  TROPONINI  --   --  0.13* 1.24*  --  4.74*    Estimated Creatinine Clearance: 47.9 mL/min (by C-G formula based on Cr of 1.09).  Assessment:  81 y/o M with chest pain/NSTEMI. Pharmacy consulted to dose heparin. AM HL 0.45. Hgb 9.8, plts 252.   Goal of Therapy:  Heparin level 0.3-0.7 units/ml Monitor platelets by anticoagulation protocol: Yes   Plan:  -Continue Heparin gtt 1100 units/hr -daily HL, CBC -Monitor for S&S of bleed  Angela Burke, PharmD Pharmacy Resident Pager: [phone] 05/01/2015,8:22 AM

## 2015-05-01 NOTE — Progress Notes (Signed)
Subjective:  1 episode of chest discomfort occurred yesterday and nitroglycerin was started.  On IV heparin.  Troponins have risen to 4.74.  Currently pain-free.  Objective:  Vital Signs in the last 24 hours: BP 98/61 mmHg  Pulse 72  Temp(Src) 98.7 F (37.1 C) (Oral)  Resp 18  Ht 5' 5.5" (1.664 m)  Wt 67.7 kg (149 lb 4 oz)  BMI 24.45 kg/m2  SpO2 96%  Physical Exam: Pleasant elderly male in no acute distress Lungs:  Clear Cardiac:  Regular rhythm, normal S1 and S2, no S3 Abdomen:  Soft, nontender, no masses Extremities:  No edema present  Intake/Output from previous day: 11/19 0701 - 11/20 0700 In: 375.4 [P.O.:240; I.V.:135.4] Out: 800 [Urine:800]  Weight Filed Weights   04/30/15 1124 04/30/15 1729 05/01/15 0440  Weight: 69.4 kg (153 lb) 68.085 kg (150 lb 1.6 oz) 67.7 kg (149 lb 4 oz)    Lab Results: Basic Metabolic Panel:  Recent Labs  04/30/15 1158 05/01/15 0339  NA 138 141  K 4.2 4.1  CL 105 108  CO2 22 26  GLUCOSE 177* 127*  BUN 16 16  CREATININE 1.06 1.09   CBC:  Recent Labs  04/30/15 1158 05/01/15 0339  WBC 7.8 8.9  HGB 9.8* 9.3*  HCT 31.3* 29.6*  MCV 85.1 83.1  PLT 252 238   Cardiac Enzymes: Troponin (Point of Care Test)  Recent Labs  04/30/15 1433  TROPIPOC 0.13*   Cardiac Panel (last 3 results)  Recent Labs  04/30/15 1431 04/30/15 2125 05/01/15 0339  TROPONINI 0.13* 1.24* 4.74*    Telemetry: Sinus rhythm.  Assessment/Plan:  1.  Non-STEMI 2.  Unstable angina pectoris 3.  Anemia of uncertain etiology 4.  Diabetes  Recommendations:  Cardiac catheterization is planned for tomorrow.  Troponins have been up.  Obtain anemia evaluation.  EKG will be repeated today.  Yesterday shows left axis deviation and right bundle branch block.  Cardiac catheterization was discussed with the patient fully including risks of myocardial infarction, death, stroke, bleeding, arrhythmia, dye allergy, renal insufficiency or bleeding.  The patient  understands and is willing to proceed.  Possibilities of intervention discussed with patient at the same time.      Kerry Hough  MD [facility]  05/01/2015, 9:16 AM

## 2015-05-01 NOTE — Progress Notes (Signed)
*  PRELIMINARY RESULTS* Echocardiogram 2D Echocardiogram has been performed.  Hayden Garza 05/01/2015, 12:10 PM

## 2015-05-01 NOTE — Progress Notes (Signed)
Dr Radford Pax made aware of B/p 96/53 HR 64-70. Instructed to hold midnight dose of Lopressor 12.5mg .

## 2015-05-01 NOTE — Progress Notes (Signed)
Dr. Eula Fried paged and notified that pt is due for his HS Cardura 8mg . B/P 93-99/40-55. Medication held as instructed by Dr. Eula Fried.

## 2015-05-02 ENCOUNTER — Encounter (HOSPITAL_COMMUNITY): Payer: Self-pay | Admitting: Cardiovascular Disease

## 2015-05-02 ENCOUNTER — Encounter (HOSPITAL_COMMUNITY)
Admission: EM | Disposition: A | Discharge status: Home / Self Care | Payer: Self-pay | Source: Home / Self Care | Attending: Cardiology

## 2015-05-02 DIAGNOSIS — I2510 Atherosclerotic heart disease of native coronary artery without angina pectoris: Secondary | ICD-10-CM

## 2015-05-02 HISTORY — PX: CARDIAC CATHETERIZATION: SHX172

## 2015-05-02 LAB — GLUCOSE, CAPILLARY
GLUCOSE-CAPILLARY: 144 mg/dL — AB (ref 65–99)
GLUCOSE-CAPILLARY: 105 mg/dL — AB (ref 65–99)
GLUCOSE-CAPILLARY: 95 mg/dL (ref 65–99)
GLUCOSE-CAPILLARY: 172 mg/dL — AB (ref 65–99)
Glucose-Capillary: 142 mg/dL — ABNORMAL HIGH (ref 65–99)

## 2015-05-02 LAB — HEMOGLOBIN A1C
Hgb A1c MFr Bld: 7.3 % — ABNORMAL HIGH (ref 4.8–5.6)
Mean Plasma Glucose: 163 mg/dL

## 2015-05-02 LAB — CBC
HEMATOCRIT: 29.1 % — AB (ref 39.0–52.0)
HEMOGLOBIN: 9.2 g/dL — AB (ref 13.0–17.0)
MCH: 26.3 pg (ref 26.0–34.0)
MCHC: 31.6 g/dL (ref 30.0–36.0)
MCV: 83.1 fL (ref 78.0–100.0)
Platelets: 231 10*3/uL (ref 150–400)
RBC: 3.5 MIL/uL — AB (ref 4.22–5.81)
RDW: 15.7 % — ABNORMAL HIGH (ref 11.5–15.5)
WBC: 8.5 10*3/uL (ref 4.0–10.5)

## 2015-05-02 LAB — HEPARIN LEVEL (UNFRACTIONATED): Heparin Unfractionated: 0.55 IU/mL (ref 0.30–0.70)

## 2015-05-02 SURGERY — LEFT HEART CATH AND CORONARY ANGIOGRAPHY
Anesthesia: LOCAL

## 2015-05-02 MED ORDER — TICAGRELOR 90 MG PO TABS
ORAL_TABLET | ORAL | Status: DC | PRN
  Administered 2015-05-02: 180 mg via ORAL

## 2015-05-02 MED ORDER — SODIUM CHLORIDE 0.9 % IJ SOLN: 3.0000 mL | INTRAMUSCULAR | Status: DC | PRN

## 2015-05-02 MED ORDER — NITROGLYCERIN 1 MG/10 ML FOR IR/CATH LAB
INTRA_ARTERIAL | Status: DC | PRN
  Administered 2015-05-02: 200 ug via INTRACORONARY

## 2015-05-02 MED ORDER — SODIUM CHLORIDE 0.9 % IV SOLN: 250.0000 mL | INTRAVENOUS | Status: DC | PRN

## 2015-05-02 MED ORDER — BIVALIRUDIN BOLUS VIA INFUSION - CUPID
INTRAVENOUS | Status: DC | PRN
  Administered 2015-05-02: 51.375 mg via INTRAVENOUS

## 2015-05-02 MED ORDER — TICAGRELOR 90 MG PO TABS
ORAL_TABLET | ORAL | Status: AC
  Filled 2015-05-02: qty 2

## 2015-05-02 MED ORDER — SODIUM CHLORIDE 0.9 % IV SOLN
250.0000 mg | INTRAVENOUS | Status: DC | PRN
  Administered 2015-05-02: 1.75 mg/kg/h via INTRAVENOUS

## 2015-05-02 MED ORDER — LIDOCAINE HCL (PF) 1 % IJ SOLN
INTRAMUSCULAR | Status: AC
  Filled 2015-05-02: qty 30

## 2015-05-02 MED ORDER — SODIUM CHLORIDE 0.9 % IJ SOLN
3.0000 mL | Freq: Two times a day (BID) | INTRAMUSCULAR | Status: DC
  Administered 2015-05-02: 18:00:00 3 mL via INTRAVENOUS

## 2015-05-02 MED ORDER — ANGIOPLASTY BOOK
Freq: Once | Status: AC
  Administered 2015-05-02: 22:00:00
  Filled 2015-05-02: qty 1

## 2015-05-02 MED ORDER — BIVALIRUDIN 250 MG IV SOLR: 1.0000 mg/kg/h | INTRAVENOUS | Status: DC

## 2015-05-02 MED ORDER — FERROUS SULFATE 325 (65 FE) MG PO TABS
325.0000 mg | ORAL_TABLET | Freq: Every day | ORAL | Status: DC
  Administered 2015-05-02 – 2015-05-03 (×2): 325 mg via ORAL
  Filled 2015-05-02 (×2): qty 1

## 2015-05-02 MED ORDER — IOHEXOL 350 MG/ML SOLN
INTRAVENOUS | Status: DC | PRN
  Administered 2015-05-02: 170 mL via INTRACARDIAC

## 2015-05-02 MED ORDER — HEPARIN (PORCINE) IN NACL 2-0.9 UNIT/ML-% IJ SOLN
INTRAMUSCULAR | Status: AC
  Filled 2015-05-02: qty 1500

## 2015-05-02 MED ORDER — ONDANSETRON HCL 4 MG/2ML IJ SOLN: 4.0000 mg | Freq: Four times a day (QID) | INTRAMUSCULAR | Status: DC | PRN

## 2015-05-02 MED ORDER — NITROGLYCERIN 1 MG/10 ML FOR IR/CATH LAB
INTRA_ARTERIAL | Status: AC
  Filled 2015-05-02: qty 10

## 2015-05-02 MED ORDER — ASPIRIN 81 MG PO CHEW: 81.0000 mg | CHEWABLE_TABLET | Freq: Every day | ORAL | Status: DC

## 2015-05-02 MED ORDER — MORPHINE SULFATE (PF) 2 MG/ML IV SOLN: 2.0000 mg | INTRAVENOUS | Status: DC | PRN

## 2015-05-02 MED ORDER — HYDRALAZINE HCL 20 MG/ML IJ SOLN: 10.0000 mg | INTRAMUSCULAR | Status: DC | PRN

## 2015-05-02 MED ORDER — BIVALIRUDIN 250 MG IV SOLR
INTRAVENOUS | Status: AC
  Filled 2015-05-02: qty 250

## 2015-05-02 MED ORDER — ACETAMINOPHEN 325 MG PO TABS: 650.0000 mg | ORAL_TABLET | ORAL | Status: DC | PRN

## 2015-05-02 MED ORDER — NITROGLYCERIN 1 MG/10 ML FOR IR/CATH LAB
INTRA_ARTERIAL | Status: DC | PRN
  Administered 2015-05-02: 14:00:00

## 2015-05-02 MED ORDER — SODIUM CHLORIDE 0.9 % WEIGHT BASED INFUSION: 3.0000 mL/kg/h | INTRAVENOUS | Status: DC

## 2015-05-02 MED ORDER — TICAGRELOR 90 MG PO TABS
90.0000 mg | ORAL_TABLET | Freq: Two times a day (BID) | ORAL | Status: DC
  Administered 2015-05-03 (×2): 90 mg via ORAL
  Filled 2015-05-02 (×2): qty 1

## 2015-05-02 SURGICAL SUPPLY — 16 items
BALLN EUPHORA RX 2.0X12 (BALLOONS) ×2
BALLN ~~LOC~~ EUPHORA RX 2.5X20 (BALLOONS) ×2
BALLOON EUPHORA RX 2.0X12 (BALLOONS) IMPLANT
BALLOON ~~LOC~~ EUPHORA RX 2.5X20 (BALLOONS) IMPLANT
CATH INFINITI 5FR MULTPACK ANG (CATHETERS) ×1 IMPLANT
CATH VISTA GUIDE 6FR XBLAD3.5 (CATHETERS) ×1 IMPLANT
KIT ENCORE 26 ADVANTAGE (KITS) ×1 IMPLANT
KIT HEART LEFT (KITS) ×2 IMPLANT
PACK CARDIAC CATHETERIZATION (CUSTOM PROCEDURE TRAY) ×2 IMPLANT
SHEATH PINNACLE 5F 10CM (SHEATH) ×1 IMPLANT
SHEATH PINNACLE 6F 10CM (SHEATH) ×1 IMPLANT
STENT SYNERGY DES 2.25X38 (Permanent Stent) ×1 IMPLANT
SYR MEDRAD MARK V 150ML (SYRINGE) ×2 IMPLANT
TRANSDUCER W/STOPCOCK (MISCELLANEOUS) ×2 IMPLANT
WIRE ASAHI PROWATER 180CM (WIRE) ×1 IMPLANT
WIRE EMERALD 3MM-J .035X150CM (WIRE) ×1 IMPLANT

## 2015-05-02 NOTE — Progress Notes (Signed)
Site area: right groin  Site Prior to Removal:  Level 0  Pressure Applied For 25 MINUTES    Minutes Beginning at 1700  Manual:   Yes.    Patient Status During Pull:  Stable   Post Pull Groin Site:  Level 0  Post Pull Instructions Given:  Yes.    Post Pull Pulses Present:  Yes.    Dressing Applied:  Yes.    Comments:  Rechecked site at 1735 with no changes, noted. Area soft with palpation, no bleeding, hematoma or bruising noted. Dressing dry and intact. Palpable right dorsalis pedas.

## 2015-05-02 NOTE — Progress Notes (Signed)
ANTICOAGULATION CONSULT NOTE - Follow Up Consult  Pharmacy Consult for Heparin Indication: chest pain/ACS/ NSTEMI  Allergies  Allergen Reactions  . Lipitor [Atorvastatin]     Joint pain  . Penicillins     REACTION: swelling in joints    Patient Measurements: Height: 5' 5.5" (166.4 cm) Weight: 151 lb 0.2 oz (68.5 kg) IBW/kg (Calculated) : 62.65 Heparin Dosing Weight: 68.5 kg  Vital Signs: Temp: 98.2 F (36.8 C) (11/21 0440) Temp Source: Oral (11/21 0440) BP: 114/59 mmHg (11/21 0648) Pulse Rate: 69 (11/21 0648)  Labs:  Recent Labs  04/30/15 1146 04/30/15 1158 04/30/15 1431 04/30/15 2125 04/30/15 2156 05/01/15 0339 05/02/15 0438  HGB 11.6* 9.8*  --   --   --  9.3* 9.2*  HCT 34.0* 31.3*  --   --   --  29.6* 29.1*  PLT  --  252  --   --   --  238 231  LABPROT  --   --   --   --   --  14.7  --   INR  --   --   --   --   --  1.13  --   HEPARINUNFRC  --   --   --   --  0.31 0.45 0.55  CREATININE 0.90 1.06  --   --   --  1.09  --   TROPONINI  --   --  0.13* 1.24*  --  4.74*  --     Estimated Creatinine Clearance: 47.9 mL/min (by C-G formula based on Cr of 1.09).  Assessment:   Heparin level remains therapeutic (0.55) on 1100 units/hr.   Hgb low stable, Fe low. Daily Fe added. No bleeding noted.   For cardiac cath later today.  Goal of Therapy:  Heparin level 0.3-0.7 units/ml Monitor platelets by anticoagulation protocol: Yes   Plan:   Continue heparin drip at 1100 units/hr.  Daily heparin level and CBC while on heparin.  Follow up post-cath.  Arty Baumgartner, [place] Pager: [phone] 05/02/2015,10:19 AM

## 2015-05-02 NOTE — Research (Signed)
TWILIGHT Research Study Informed Consent   Subject Name: Hayden Garza  Subject met inclusion and exclusion criteria.  The informed consent form, study requirements and expectations were reviewed with the subject and questions and concerns were addressed prior to the signing of the consent form.  The subject verbalized understanding of the trial requirements.  The subject agreed to participate in the TWILIGHT trial and signed the informed consent on 05/02/2015 at 1630.  The informed consent was obtained prior to performance of any protocol-specific procedures for the subject.  A copy of the signed informed consent was given to the subject and a copy was placed in the subject's medical record.  Blossom Hoops 05/02/2015, 4:46 PM

## 2015-05-02 NOTE — Care Management Note (Addendum)
Case Management Note  Patient Details  Name: Hayden Garza MRN: [id number] Date of Birth: 1933-01-22  Subjective/Objective:  Pt admitted for Chest pain. Plan for cardiac cath 05-02-15. Pt is from home.                    Action/Plan:  CM will continue to monitor for disposition needs.   Expected Discharge Date:  05/03/15               Expected Discharge Plan:  Home/Self Care  In-House Referral:     Discharge planning Services  CM Consult  Post Acute Care Choice:    Choice offered to:     DME Arranged:    DME Agency:     HH Arranged:    HH Agency:     Status of Service:  In process, will continue to follow  Medicare Important Message Given:    Date Medicare IM Given:    Medicare IM give by:    Date Additional Medicare IM Given:    Additional Medicare Important Message give by:     If discussed at [place] of Stay Meetings, dates discussed:    Additional Comments:  Bethena Roys, RN 05/02/2015, 10:47 AM

## 2015-05-02 NOTE — Progress Notes (Signed)
UR Completed Nohemi Nicklaus Graves-Bigelow, RN,BSN [phone]  

## 2015-05-02 NOTE — Research (Signed)
Mr. Janiga has agreed to participate in the TWILIGHT Research Study. He will be taking Brilinta 90 mg bid and ASA 81 mg daily. After 3 months he will be randomized to either stay on the Brilinta  and ASA or to take Brilinta only.  The Brilinta and ASA will be provided as part of the study. Study drug will be given on the day of discharge. Please do not give a prescription for the Brilinta as it is provided by the study.  Blossom Hoops, RN, Research Nurse, [phone].

## 2015-05-02 NOTE — Progress Notes (Signed)
Patient Name: Hayden Garza Date of Encounter: 05/02/2015  Principal Problem:   NSTEMI (non-ST elevated myocardial infarction) [facility]) Active Problems:   Diabetes mellitus type 2, noninsulin dependent ([place])   Hyperlipidemia    Primary Cardiologist: New Patient Profile: 81 y.o. male w/ PMH of Type 2 DM and HLD who presented to [facility] ED on 04/30/2015 for new onset-chest pain. Peak troponin of 4.74.  SUBJECTIVE: Denies any chest pain, palpitations, or shortness of breath overnight or this AM.  OBJECTIVE Filed Vitals:   05/02/15 0011 05/02/15 0440 05/02/15 0645 05/02/15 0648  BP: 95/57 117/64 114/59 114/59  Pulse: 69 72 69 69  Temp:  98.2 F (36.8 C)    TempSrc:  Oral    Resp:  18 18   Height:      Weight:  151 lb 0.2 oz (68.5 kg)    SpO2:  98% 97%     Intake/Output Summary (Last 24 hours) at 05/02/15 0740 Last data filed at 05/02/15 0600  Gross per 24 hour  Intake 1510.79 ml  Output    952 ml  Net 558.79 ml   Filed Weights   04/30/15 1729 05/01/15 0440 05/02/15 0440  Weight: 150 lb 1.6 oz (68.085 kg) 149 lb 4 oz (67.7 kg) 151 lb 0.2 oz (68.5 kg)    PHYSICAL EXAM General: Well developed, well nourished, male in no acute distress. Head: Normocephalic, atraumatic.  Neck: Supple without bruits, JVD not elevated. Lungs:  Resp regular and unlabored, CTA without wheezing or rales. Heart: RRR, S1, S2, no S3, S4, or murmur; no rub. Abdomen: Soft, non-tender, non-distended with normoactive bowel sounds. No hepatomegaly. No rebound/guarding. No obvious abdominal masses. Extremities: No clubbing, cyanosis, or edema. Distal pedal pulses are 2+ bilaterally. Neuro: Alert and oriented X 3. Moves all extremities spontaneously. Psych: Normal affect.  LABS: CBC:  Recent Labs  05/01/15 0339 05/02/15 0438  WBC 8.9 8.5  HGB 9.3* 9.2*  HCT 29.6* 29.1*  MCV 83.1 83.1  PLT 238 231   INR:  Recent Labs  05/01/15 0339  INR [id number]   Basic Metabolic Panel:  Recent  Labs  04/30/15 1158 05/01/15 0339  NA 138 141  K 4.2 4.1  CL 105 108  CO2 22 26  GLUCOSE 177* 127*  BUN 16 16  CREATININE 1.06 1.09  CALCIUM 9.3 8.9  MG  --  1.7   Liver Function Tests:  Recent Labs  05/01/15 0339  AST 40  ALT 22  ALKPHOS 63  BILITOT 0.2*  PROT 6.5  ALBUMIN 3.2*   Cardiac Enzymes:  Recent Labs  04/30/15 1431 04/30/15 2125 05/01/15 0339  TROPONINI 0.13* 1.24* 4.74*    Recent Labs  04/30/15 1144 04/30/15 1433  TROPIPOC 0.04 0.13*   Fasting Lipid Panel:  Recent Labs  05/01/15 0339  CHOL 132  HDL 28*  LDLCALC 89  TRIG 77  CHOLHDL 4.7   Thyroid Function Tests:  Recent Labs  05/01/15 0339  TSH 1.551   Anemia Panel:  Recent Labs  05/01/15 1150 05/01/15 1151  VITAMINB12 209  --   FOLATE  --  19.5  FERRITIN 7*  --   TIBC 335  --   IRON 31*  --   RETICCTPCT 1.1  --     TELE:  NSR with rate in 60's - 70's.      ECG: NSR with rate in 60s. TWI in V1, V2, V3. Known RBBB.  ECHO: 05/01/2015 Study Conclusions - Left ventricle: The cavity  size was normal. Wall thickness was normal. Systolic function was mildly reduced. The estimated ejection fraction was in the range of 45% to 50%. Severe hypokinesis of the mid-apicalanteroseptal myocardium. Features are consistent with a pseudonormal left ventricular filling pattern, with concomitant abnormal relaxation and increased filling pressure (grade 2 diastolic dysfunction). - Aortic valve: Valve area (Vmax): 3.09 cm^2. - Mitral valve: There was mild regurgitation. - Left atrium: The atrium was mildly dilated. - Right ventricle: The cavity size was mildly dilated.  Radiology/Studies: Dg Chest Portable 1 View: 04/30/2015  CLINICAL DATA:  Midline chest pain beginning this morning. History of diabetes. EXAM: PORTABLE CHEST 1 VIEW COMPARISON:  02/24/2010 FINDINGS: Cardiac silhouette normal in size and configuration. No mediastinal or hilar masses or evidence of adenopathy.  Mild elevation of the right hemidiaphragm, stable. Lungs are clear.  No pleural effusion or pneumothorax. 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 Bony thorax is demineralized but grossly intact. IMPRESSION: No acute cardiopulmonary disease. Electronically Signed   By: Lajean Manes M.D.   On: 04/30/2015 11:49     Current Medications:  . aspirin EC  81 mg Oral Daily  . cholecalciferol  1,000 Units Oral Daily  . doxazosin  8 mg Oral QHS  . ferrous sulfate  325 mg Oral Q breakfast  . glimepiride  4 mg Oral QAC breakfast  . insulin aspart  0-15 Units Subcutaneous TID WC  . insulin aspart  0-5 Units Subcutaneous QHS  . metoprolol tartrate  12.5 mg Oral 4 times per day  . pantoprazole  40 mg Oral Daily  . sodium chloride  3 mL Intravenous Q12H  . sodium chloride  3 mL Intravenous Q12H   . sodium chloride 1 mL/kg/hr (05/02/15 0400)  . heparin 1,100 Units/hr (05/01/15 0952)  . nitroGLYCERIN 5 mcg/min (05/01/15 0400)    ASSESSMENT AND PLAN:  1. NSTEMI (non-ST elevated myocardial infarction) (HCC) - cyclic troponin values have been 0.13, 1.24, and 4.74. - old RBBB on EKG. TWI in V1, V2, V3. - continue ASA, BB, Heparin, and NTG drip. BP's have been soft, likely related to NTG drip. Would not start ACE-I at this time. - LHC is scheduled for today at 1330 with Dr. Gwenlyn Found.   2. Diabetes mellitus type 2, noninsulin dependent (HCC) - SSI while admitted. - A1c is pending.  3. Hyperlipidemia - LDL at 89. - intolerant to Lipitor in the past. Consider alternative statin or 3x/weekly dosing.  4. Anemia - Hgb at 9.2 on 05/02/2015. - Ferritin low at 7, consistent with iron deficiency anemia. - will start Ferrous Sulfate 325mg  daily.  Arna Medici , PA-C 7:40 AM 05/02/2015 Pager: [phone]  Patient seen, examined. Available data reviewed. Agree with findings, assessment, and plan as outlined by Bernerd Pho, PA-C. The patient is independently interviewed and  examined. Lung fields are clear. Heart is regular rate and rhythm without murmur. There is no peripheral edema. Patient presents with typical symptoms of acute coronary syndrome and he has ruled in for a non-ST elevation infarction. This is in the setting of diabetes and hyperlipidemia. Plans for cardiac catheterization and possible PCI noted. This is reviewed with the patient and his wife who is at the bedside. All questions answered. Disposition pending cardiac catheterization results. Echo suggests LAD-territory ischemia/infarct.  Sherren Mocha, M.D. 05/02/2015 11:31 AM

## 2015-05-02 NOTE — H&P (View-Only) (Signed)
Patient Name: Hayden Garza Date of Encounter: 05/02/2015  Principal Problem:   NSTEMI (non-ST elevated myocardial infarction) [facility]) Active Problems:   Diabetes mellitus type 2, noninsulin dependent ([place])   Hyperlipidemia    Primary Cardiologist: New Patient Profile: 81 y.o. male w/ PMH of Type 2 DM and HLD who presented to [facility] ED on 04/30/2015 for new onset-chest pain. Peak troponin of 4.74.  SUBJECTIVE: Denies any chest pain, palpitations, or shortness of breath overnight or this AM.  OBJECTIVE Filed Vitals:   05/02/15 0011 05/02/15 0440 05/02/15 0645 05/02/15 0648  BP: 95/57 117/64 114/59 114/59  Pulse: 69 72 69 69  Temp:  98.2 F (36.8 C)    TempSrc:  Oral    Resp:  18 18   Height:      Weight:  151 lb 0.2 oz (68.5 kg)    SpO2:  98% 97%     Intake/Output Summary (Last 24 hours) at 05/02/15 0740 Last data filed at 05/02/15 0600  Gross per 24 hour  Intake 1510.79 ml  Output    952 ml  Net 558.79 ml   Filed Weights   04/30/15 1729 05/01/15 0440 05/02/15 0440  Weight: 150 lb 1.6 oz (68.085 kg) 149 lb 4 oz (67.7 kg) 151 lb 0.2 oz (68.5 kg)    PHYSICAL EXAM General: Well developed, well nourished, male in no acute distress. Head: Normocephalic, atraumatic.  Neck: Supple without bruits, JVD not elevated. Lungs:  Resp regular and unlabored, CTA without wheezing or rales. Heart: RRR, S1, S2, no S3, S4, or murmur; no rub. Abdomen: Soft, non-tender, non-distended with normoactive bowel sounds. No hepatomegaly. No rebound/guarding. No obvious abdominal masses. Extremities: No clubbing, cyanosis, or edema. Distal pedal pulses are 2+ bilaterally. Neuro: Alert and oriented X 3. Moves all extremities spontaneously. Psych: Normal affect.  LABS: CBC:  Recent Labs  05/01/15 0339 05/02/15 0438  WBC 8.9 8.5  HGB 9.3* 9.2*  HCT 29.6* 29.1*  MCV 83.1 83.1  PLT 238 231   INR:  Recent Labs  05/01/15 0339  INR Q000111Q   Basic Metabolic Panel:  Recent  Labs  04/30/15 1158 05/01/15 0339  NA 138 141  K 4.2 4.1  CL 105 108  CO2 22 26  GLUCOSE 177* 127*  BUN 16 16  CREATININE 1.06 1.09  CALCIUM 9.3 8.9  MG  --  1.7   Liver Function Tests:  Recent Labs  05/01/15 0339  AST 40  ALT 22  ALKPHOS 63  BILITOT 0.2*  PROT 6.5  ALBUMIN 3.2*   Cardiac Enzymes:  Recent Labs  04/30/15 1431 04/30/15 2125 05/01/15 0339  TROPONINI 0.13* 1.24* 4.74*    Recent Labs  04/30/15 1144 04/30/15 1433  TROPIPOC 0.04 0.13*   Fasting Lipid Panel:  Recent Labs  05/01/15 0339  CHOL 132  HDL 28*  LDLCALC 89  TRIG 77  CHOLHDL 4.7   Thyroid Function Tests:  Recent Labs  05/01/15 0339  TSH 1.551   Anemia Panel:  Recent Labs  05/01/15 1150 05/01/15 1151  VITAMINB12 209  --   FOLATE  --  19.5  FERRITIN 7*  --   TIBC 335  --   IRON 31*  --   RETICCTPCT 1.1  --     TELE:  NSR with rate in 60's - 70's.      ECG: NSR with rate in 60s. TWI in V1, V2, V3. Known RBBB.  ECHO: 05/01/2015 Study Conclusions - Left ventricle: The cavity  size was normal. Wall thickness was normal. Systolic function was mildly reduced. The estimated ejection fraction was in the range of 45% to 50%. Severe hypokinesis of the mid-apicalanteroseptal myocardium. Features are consistent with a pseudonormal left ventricular filling pattern, with concomitant abnormal relaxation and increased filling pressure (grade 2 diastolic dysfunction). - Aortic valve: Valve area (Vmax): 3.09 cm^2. - Mitral valve: There was mild regurgitation. - Left atrium: The atrium was mildly dilated. - Right ventricle: The cavity size was mildly dilated.  Radiology/Studies: Dg Chest Portable 1 View: 04/30/2015  CLINICAL DATA:  Midline chest pain beginning this morning. History of diabetes. EXAM: PORTABLE CHEST 1 VIEW COMPARISON:  02/24/2010 FINDINGS: Cardiac silhouette normal in size and configuration. No mediastinal or hilar masses or evidence of adenopathy.  Mild elevation of the right hemidiaphragm, stable. Lungs are clear.  No pleural effusion or pneumothorax. 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 Bony thorax is demineralized but grossly intact. IMPRESSION: No acute cardiopulmonary disease. Electronically Signed   By: Lajean Manes M.D.   On: 04/30/2015 11:49     Current Medications:  . aspirin EC  81 mg Oral Daily  . cholecalciferol  1,000 Units Oral Daily  . doxazosin  8 mg Oral QHS  . ferrous sulfate  325 mg Oral Q breakfast  . glimepiride  4 mg Oral QAC breakfast  . insulin aspart  0-15 Units Subcutaneous TID WC  . insulin aspart  0-5 Units Subcutaneous QHS  . metoprolol tartrate  12.5 mg Oral 4 times per day  . pantoprazole  40 mg Oral Daily  . sodium chloride  3 mL Intravenous Q12H  . sodium chloride  3 mL Intravenous Q12H   . sodium chloride 1 mL/kg/hr (05/02/15 0400)  . heparin 1,100 Units/hr (05/01/15 0952)  . nitroGLYCERIN 5 mcg/min (05/01/15 0400)    ASSESSMENT AND PLAN:  1. NSTEMI (non-ST elevated myocardial infarction) (HCC) - cyclic troponin values have been 0.13, 1.24, and 4.74. - old RBBB on EKG. TWI in V1, V2, V3. - continue ASA, BB, Heparin, and NTG drip. BP's have been soft, likely related to NTG drip. Would not start ACE-I at this time. - LHC is scheduled for today at 1330 with Dr. Gwenlyn Found.   2. Diabetes mellitus type 2, noninsulin dependent (HCC) - SSI while admitted. - A1c is pending.  3. Hyperlipidemia - LDL at 89. - intolerant to Lipitor in the past. Consider alternative statin or 3x/weekly dosing.  4. Anemia - Hgb at 9.2 on 05/02/2015. - Ferritin low at 7, consistent with iron deficiency anemia. - will start Ferrous Sulfate 325mg  daily.  Arna Medici , PA-C 7:40 AM 05/02/2015 Pager: [phone]  Patient seen, examined. Available data reviewed. Agree with findings, assessment, and plan as outlined by Bernerd Pho, PA-C. The patient is independently interviewed and  examined. Lung fields are clear. Heart is regular rate and rhythm without murmur. There is no peripheral edema. Patient presents with typical symptoms of acute coronary syndrome and he has ruled in for a non-ST elevation infarction. This is in the setting of diabetes and hyperlipidemia. Plans for cardiac catheterization and possible PCI noted. This is reviewed with the patient and his wife who is at the bedside. All questions answered. Disposition pending cardiac catheterization results. Echo suggests LAD-territory ischemia/infarct.  Sherren Mocha, M.D. 05/02/2015 11:31 AM

## 2015-05-02 NOTE — Interval H&P Note (Signed)
Cath Lab Visit (complete for each Cath Lab visit)  Clinical Evaluation Leading to the Procedure:   ACS: Yes.    Non-ACS:    Anginal Classification: CCS III  Anti-ischemic medical therapy: No Therapy  Non-Invasive Test Results: No non-invasive testing performed  Prior CABG: No previous CABG      History and Physical Interval Note:  05/02/2015 12:25 PM  Hayden Garza  has presented today for surgery, with the diagnosis of NSTEMI  The various methods of treatment have been discussed with the patient and family. After consideration of risks, benefits and other options for treatment, the patient has consented to  Procedure(s): Left Heart Cath and Coronary Angiography (N/A) as a surgical intervention .  The patient's history has been reviewed, patient examined, no change in status, stable for surgery.  I have reviewed the patient's chart and labs.  Questions were answered to the patient's satisfaction.     Quay Burow

## 2015-05-02 NOTE — Progress Notes (Signed)
R & L Radial and R groin shaved in preparation for cardiac cath. Consent form signed. Pt has been NPO since midnight. VSS.

## 2015-05-03 ENCOUNTER — Encounter (HOSPITAL_COMMUNITY): Payer: Self-pay | Admitting: Student

## 2015-05-03 LAB — GLUCOSE, CAPILLARY: Glucose-Capillary: 146 mg/dL — ABNORMAL HIGH (ref 65–99)

## 2015-05-03 LAB — BASIC METABOLIC PANEL
Anion gap: 10 (ref 5–15)
BUN: 12 mg/dL (ref 6–20)
CO2: 23 mmol/L (ref 22–32)
CREATININE: 1.15 mg/dL (ref 0.61–1.24)
Calcium: 8.9 mg/dL (ref 8.9–10.3)
Chloride: 108 mmol/L (ref 101–111)
GFR calc Af Amer: >60 mL/min (ref >60)
GFR, EST NON AFRICAN AMERICAN: 58 mL/min — AB (ref >60)
GLUCOSE: 146 mg/dL — AB (ref 65–99)
POTASSIUM: 3.9 mmol/L (ref 3.5–5.1)
SODIUM: 141 mmol/L (ref 135–145)

## 2015-05-03 LAB — CBC
HCT: 28.6 % — ABNORMAL LOW (ref 39.0–52.0)
Hemoglobin: 9.2 g/dL — ABNORMAL LOW (ref 13.0–17.0)
MCH: 27.1 pg (ref 26.0–34.0)
MCHC: 32.2 g/dL (ref 30.0–36.0)
MCV: 84.4 fL (ref 78.0–100.0)
PLATELETS: 216 10*3/uL (ref 150–400)
RBC: 3.39 MIL/uL — AB (ref 4.22–5.81)
RDW: 15.9 % — AB (ref 11.5–15.5)
WBC: 8.3 10*3/uL (ref 4.0–10.5)

## 2015-05-03 LAB — POCT ACTIVATED CLOTTING TIME: ACTIVATED CLOTTING TIME: 356 s

## 2015-05-03 MED ORDER — ROSUVASTATIN CALCIUM 10 MG PO TABS: 5.0000 mg | ORAL_TABLET | Freq: Every day | ORAL | Status: DC

## 2015-05-03 MED ORDER — METOPROLOL TARTRATE 12.5 MG HALF TABLET: 12.5000 mg | ORAL_TABLET | Freq: Two times a day (BID) | ORAL | Status: DC

## 2015-05-03 MED ORDER — ASPIRIN 81 MG PO TBEC
81.0000 mg | DELAYED_RELEASE_TABLET | Freq: Every day | ORAL | Status: DC
Start: 2015-05-03 — End: 2015-05-03

## 2015-05-03 MED ORDER — ROSUVASTATIN CALCIUM 5 MG PO TABS: 5.0000 mg | ORAL_TABLET | Freq: Every day | ORAL | Status: DC

## 2015-05-03 MED ORDER — FERROUS SULFATE 325 (65 FE) MG PO TABS
325.0000 mg | ORAL_TABLET | Freq: Every day | ORAL | Status: DC
Start: 2015-05-03 — End: 2020-06-24

## 2015-05-03 MED ORDER — METOPROLOL TARTRATE 25 MG PO TABS: 12.5000 mg | ORAL_TABLET | Freq: Two times a day (BID) | ORAL | Status: DC

## 2015-05-03 MED ORDER — TICAGRELOR 90 MG PO TABS: 90.0000 mg | ORAL_TABLET | Freq: Two times a day (BID) | ORAL | Status: DC

## 2015-05-03 NOTE — Discharge Instructions (Signed)

## 2015-05-03 NOTE — Discharge Summary (Signed)
CARDIOLOGY DISCHARGE SUMMARY   Patient ID: Hayden Garza MRN: [id number] DOB/AGE: 09-04-1932 81 y.o.  Admit date: 04/30/2015 Discharge date: 05/03/2015  PCP: Abigail Miyamoto, MD Primary Cardiologist: Dr. Stanford Breed  Primary Discharge Diagnosis: NSTEMI (non-ST elevated myocardial infarction) [facility]) Secondary Discharge Diagnosis: Diabetes mellitus type 2, noninsulin dependent ([place]), Hyperlipidemia   Consults: None  Procedures: Left Heart Catheterization, Coronary Angiography, Percutaneous Intervention  Hospital Course: Hayden Garza is a 81 y.o. male with past medical history of Type 2 Diabetes Mellitus, HLD, and a family history of premature heart disease who presented to [facility] ED on 04/30/2015 for chest pain that had started earlier that day while at rest. He then began to have pain down both arms. With administration of SL NTG and ASA  his pain decreased to a 10/10 to 1/10.   Upon arrival to the ED, his EKG showed NSR with an old RBBB. No ST or T-wave changes were noted. His initial troponin was elevated to 0.13 and he was started on Heparin and a NTG drip. A cardiac catheterization was recommended to further evaluate his pain. The risks and benefits of the procedure were explained to the patient and he agreed to proceed.  On the morning of 05/01/2015, he reported one additional episode of chest discomfort. His cyclic troponin values were 0.13, 1.24, and 4.74. Cath was planned for the following day. An echocardiogram was obtained which showed an EF of 45% to 50%. Severe hypokinesis of the mid-apicalanteroseptal myocardium was also noted along with grade 2 diastolic dysfunction.   On 05/02/2015, he reported no further episodes of chest pain overnight. His was taken to the catheterization lab that afternoon and found to have 90% stenosis in the proximal to distal LAD. A long 44mm Synergy DES was placed at the site of the lesion. Stenosis was also present along the inferior branch  of D1 and proximal segment of OM1 which will be medically managed at this time. There were no complications noted during the procedure.  He was doing well the morning following his catheterization. He had mild ecchymosis along his right groin cath site but no evidence of a hematoma or bruit. He ambulated down the hallway without any chest discomfort or anginal symptoms.  He was started on ASA and Brilinta. He has enrolled in the [place] will be provided as part of the study. He has also been started on a Metoprolol 12.5mg  BID (34-month supply sent to [facility], yearly supply sent to [employer]). He reported a history of statin intolerance but he has agreed to try Crestor 5mg  on a Monday-Wednesday-Friday schedule for now (reported having this at home). His BP was noted to be soft at times during the hospitalization. This should be readdressed at follow-up and he should be started on an ACE-I at that time if BP allows.  Also while admitted, his hemoglobin was noted to be low at 9.2. His ferritin was also checked and was low at 7. He reported a history of anemia 20+ years ago but nothing recent. He was started an an iron supplement of Ferrous Sulfate 325mg  daily and instructed to follow-up with his PCP.  The patient was last examined by Dr. Burt Knack and deemed stable for discharge. He has cardiology follow-up on 05/13/2015.  Labs:   Lab Results  Component Value Date   WBC 8.3 05/03/2015   HGB 9.2* 05/03/2015   HCT 28.6* 05/03/2015   MCV 84.4 05/03/2015   PLT 216 05/03/2015  Recent Labs Lab 05/01/15 0339 05/03/15 0623  NA 141 141  K 4.1 3.9  CL 108 108  CO2 26 23  BUN 16 12  CREATININE 1.09 1.15  CALCIUM 8.9 8.9  PROT 6.5  --   BILITOT 0.2*  --   ALKPHOS 63  --   ALT 22  --   AST 40  --   GLUCOSE 127* 146*    Recent Labs  04/30/15 1431 04/30/15 2125 05/01/15 0339  TROPONINI 0.13* 1.24* 4.74*   Lipid Panel     Component Value Date/Time   CHOL  132 05/01/2015 0339   TRIG 77 05/01/2015 0339   HDL 28* 05/01/2015 0339   CHOLHDL 4.7 05/01/2015 0339   VLDL 15 05/01/2015 0339   LDLCALC 89 05/01/2015 0339    Recent Labs  05/01/15 0339  INR 1.13      Radiology:  Dg Chest Portable 1 View: 04/30/2015  CLINICAL DATA:  Midline chest pain beginning this morning. History of diabetes. EXAM: PORTABLE CHEST 1 VIEW COMPARISON:  02/24/2010 FINDINGS: Cardiac silhouette normal in size and configuration. No mediastinal or hilar masses or evidence of adenopathy. Mild elevation of the right hemidiaphragm, stable. Lungs are clear.  No pleural effusion or pneumothorax. 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 Bony thorax is demineralized but grossly intact. IMPRESSION: No acute cardiopulmonary disease. Electronically Signed   By: Lajean Manes M.D.   On: 04/30/2015 11:49    CARDIAC CATHETERIZATION: 05/03/2015   Mid RCA lesion, 40% stenosed.  Mid RCA to Dist RCA lesion, 40% stenosed.  1st Diag lesion, 90% stenosed.  Mid LAD lesion, 90% stenosed.  Dist LAD lesion, 95% stenosed.  Ost 2nd Mrg to 2nd Mrg lesion, 90% stenosed.  Prox LAD to Mid LAD lesion, 90% stenosed. Post intervention, there is a 0% residual stenosis.  There is mild left ventricular systolic dysfunction.  IMPRESSION:successful stenting of the entire mid LAD with a long 30 mm Synergy drug-eluting stent. This was the infarct-related vessel and there was an anteroapical wall motion abnormality. The patient does have disease in the inferior branch of D1 and of long proximal segment of OM1 which we treated medically at this time. Patient will be hydrated overnight and discharged home in the morning on dual antiplatelet therapy.  Echo: 05/01/2015 Study Conclusions - Left ventricle: The cavity size was normal. Wall thickness was normal. Systolic function was mildly reduced. The estimated ejection fraction was in the range of 45% to 50%. Severe hypokinesis of the  mid-apicalanteroseptal myocardium. Features are consistent with a pseudonormal left ventricular filling pattern, with concomitant abnormal relaxation and increased filling pressure (grade 2 diastolic dysfunction). - Aortic valve: Valve area (Vmax): 3.09 cm^2. - Mitral valve: There was mild regurgitation. - Left atrium: The atrium was mildly dilated. - Right ventricle: The cavity size was mildly dilated.  FOLLOW UP PLANS AND APPOINTMENTS Allergies  Allergen Reactions  . Lipitor [Atorvastatin]     Joint pain  . Penicillins     REACTION: swelling in joints     Medication List    TAKE these medications        aspirin 81 MG EC tablet  Take 1 tablet (81 mg total) by mouth daily.     Cholecalciferol 1000 UNITS capsule  Take 1,000 Units by mouth daily.     CO Q 10 PO  Take 1 tablet by mouth daily.     doxazosin 8 MG tablet  Commonly known as:  CARDURA  Take 8 mg by mouth at bedtime.  ferrous sulfate 325 (65 FE) MG tablet  Take 1 tablet (325 mg total) by mouth daily with breakfast.     glimepiride 4 MG tablet  Commonly known as:  AMARYL  Take 4 mg by mouth daily before breakfast.     metFORMIN 500 MG tablet  Commonly known as:  GLUCOPHAGE  Take 1,000 mg by mouth 2 (two) times daily with a meal.  Notes to Patient:  RESUME ON EVENING OF 05/04/2015. (48 HOURS AFTER YOUR CATHETERIZATION)     metoprolol tartrate 25 MG tablet  Commonly known as:  LOPRESSOR  Take 0.5 tablets (12.5 mg total) by mouth 2 (two) times daily.     omeprazole 20 MG capsule  Commonly known as:  PRILOSEC  Take 20 mg by mouth daily as needed. heartburn     rosuvastatin 5 MG tablet  Commonly known as:  CRESTOR  Take 1 tablet (5 mg total) by mouth daily at 6 PM.     ticagrelor 90 MG Tabs tablet  Commonly known as:  BRILINTA  Take 1 tablet (90 mg total) by mouth 2 (two) times daily.     zolpidem 5 MG tablet  Commonly known as:  AMBIEN  Take 5 mg by mouth at bedtime as needed. sleep          Discharge Instructions    AMB Referral to Cardiac Rehabilitation - Phase II    Complete by:  As directed   Diagnosis:  PCI     Amb Referral to Cardiac Rehabilitation    Complete by:  As directed   To [place]  Diagnosis:   Myocardial Infarction PCI            Follow-up Information    Follow up with Erlene Quan, PA-C On 05/13/2015.   Specialties:  Cardiology, Radiology   Why:  Cardiology Hospital Follow-Up on 05/13/2015 at 1:30PM. (Office in [place])   Contact information:   [address] [place] [address] [phone]       BRING ALL MEDICATIONS WITH YOU TO FOLLOW UP APPOINTMENTS  Time spent with patient to include physician time: 45 minutes Signed: Erma Heritage, PA 05/03/2015, 12:08 PM Co-Sign MD

## 2015-05-03 NOTE — Progress Notes (Signed)
CM received consult : Verify brilinta per nursing staff. Pt has decided to participate in the twilight study. No needs identified @ present time per CM. Whitman Hero RN,BSN,CM [phone]

## 2015-05-03 NOTE — Progress Notes (Signed)
CARDIAC REHAB PHASE I   Pt ambulated independently in hallway this morning, tolerated well, denies cp, dizziness, DOE. Completed MI/stent education with pt wife at bedside.  Reviewed risk factors, tobacco cessation (pt wife states pt chews tobacco), anti-platelet therapy, stent card, activity restrictions, ntg, exercise, heart healthy diet, carb counting, portion control, s/s heart failure, daily weights, sodium restrictions, and phase 2 cardiac rehab. Pt verbalized understanding. Pt agrees to phase 2 cardiac rehab referral, states he is not very interested though, will send to [facility] per pt request. Pt states he is not willing to make changes to his diet at this time, otherwise receptive to education. Pt in bed, call bell within reach, awaiting discharge.  [phone]   Lenna Sciara, RN, BSN 05/03/2015 10:41 AM

## 2015-05-03 NOTE — Care Management Important Message (Signed)
Important Message  Patient Details  Name: Hayden Garza MRN: [id number] Date of Birth: Jun 20, 1932   Medicare Important Message Given:  Yes    Tarisha Fader P Margo Lama 05/03/2015, 3:06 PM

## 2015-05-03 NOTE — Progress Notes (Addendum)
Patient Name: Hayden Garza Date of Encounter: 05/03/2015  Principal Problem:   NSTEMI (non-ST elevated myocardial infarction) [facility]) Active Problems:   Diabetes mellitus type 2, noninsulin dependent ([place])   Hyperlipidemia   Primary Cardiologist: Dr. Stanford Breed Patient Profile: 81 y.o. male w/ PMH of Type 2 DM and HLD who presented to [facility] ED on 04/30/2015 for new onset-chest pain. Peak troponin of 4.74.  SUBJECTIVE: Feeling well. Denies any chest pain, palpitations, or shortness of breath overnight. Reports some bruising along his right groin cath site.  OBJECTIVE Filed Vitals:   05/02/15 2100 05/03/15 0053 05/03/15 0413 05/03/15 0635  BP: 131/71 125/54 100/59 121/59  Pulse: 69 75 69 72  Temp:   98.4 F (36.9 C)   TempSrc:   Oral   Resp: 16  19   Height:      Weight:   153 lb 14.1 oz (69.8 kg)   SpO2: 92%  97%     Intake/Output Summary (Last 24 hours) at 05/03/15 0731 Last data filed at 05/03/15 0145  Gross per 24 hour  Intake 1141.95 ml  Output   2325 ml  Net -1183.05 ml   Filed Weights   05/01/15 0440 05/02/15 0440 05/03/15 0413  Weight: 149 lb 4 oz (67.7 kg) 151 lb 0.2 oz (68.5 kg) 153 lb 14.1 oz (69.8 kg)    PHYSICAL EXAM General: Well developed, well nourished, male in no acute distress. Head: Normocephalic, atraumatic.  Neck: Supple without bruits, JVD not elevated. Lungs:  Resp regular and unlabored, CTA without wheezing or rales. Heart: RRR, S1, S2, no S3, S4, 2/6 early SEM at the RUSB; no rub. Abdomen: Soft, non-tender, non-distended with normoactive bowel sounds. No hepatomegaly. No rebound/guarding. No obvious abdominal masses. Extremities: No clubbing, cyanosis, or edema. Distal pedal pulses are 2+ bilaterally. Right groin cath site with ecchymosis. No evidence of bruit or hematoma. Neuro: Alert and oriented X 3. Moves all extremities spontaneously. Psych: Normal affect.  LABS: CBC: Recent Labs  05/02/15 0438 05/03/15 0623  WBC 8.5 8.3   HGB 9.2* 9.2*  HCT 29.1* 28.6*  MCV 83.1 84.4  PLT 231 216   INR: Recent Labs  05/01/15 0339  INR [id number]   Basic Metabolic Panel: Recent Labs  05/01/15 0339 05/03/15 0623  NA 141 141  K 4.1 3.9  CL 108 108  CO2 26 23  GLUCOSE 127* 146*  BUN 16 12  CREATININE 1.09 1.15  CALCIUM 8.9 8.9  MG 1.7  --    Liver Function Tests: Recent Labs  05/01/15 0339  AST 40  ALT 22  ALKPHOS 63  BILITOT 0.2*  PROT 6.5  ALBUMIN 3.2*   Cardiac Enzymes: Recent Labs  04/30/15 1431 04/30/15 2125 05/01/15 0339  TROPONINI 0.13* 1.24* 4.74*    Recent Labs  04/30/15 1144 04/30/15 1433  TROPIPOC 0.04 0.13*   Hemoglobin A1C: Recent Labs  05/01/15 0339  HGBA1C 7.3*   Fasting Lipid Panel: Recent Labs  05/01/15 0339  CHOL 132  HDL 28*  LDLCALC 89  TRIG 77  CHOLHDL 4.7   Thyroid Function Tests: Recent Labs  05/01/15 0339  TSH 1.551   Anemia Panel: Recent Labs  05/01/15 1150 05/01/15 1151  VITAMINB12 209  --   FOLATE  --  19.5  FERRITIN 7*  --   TIBC 335  --   IRON 31*  --   RETICCTPCT 1.1  --     TELE:   NSR with rate in 50's - 70's.  No atopic events.     CARDIAC CATHETERIZATION: 05/03/2015   Mid RCA lesion, 40% stenosed.  Mid RCA to Dist RCA lesion, 40% stenosed.  1st Diag lesion, 90% stenosed.  Mid LAD lesion, 90% stenosed.  Dist LAD lesion, 95% stenosed.  Ost 2nd Mrg to 2nd Mrg lesion, 90% stenosed.  Prox LAD to Mid LAD lesion, 90% stenosed. Post intervention, there is a 0% residual stenosis.  There is mild left ventricular systolic dysfunction.  IMPRESSION:successful stenting of the entire mid LAD with a long 30 mm Synergy drug-eluting stent. This was the infarct-related vessel and there was an anteroapical wall motion abnormality. The patient does have disease in the inferior branch of D1 and of long proximal segment of OM1 which we treated medically at this time. Patient will be hydrated overnight and discharged home in the morning on dual  antiplatelet therapy.   05/01/2015 Study Conclusions - Left ventricle: The cavity size was normal. Wall thickness was normal. Systolic function was mildly reduced. The estimated ejection fraction was in the range of 45% to 50%. Severe hypokinesis of the mid-apicalanteroseptal myocardium. Features are consistent with a pseudonormal left ventricular filling pattern, with concomitant abnormal relaxation and increased filling pressure (grade 2 diastolic dysfunction). - Aortic valve: Valve area (Vmax): 3.09 cm^2. - Mitral valve: There was mild regurgitation. - Left atrium: The atrium was mildly dilated. - Right ventricle: The cavity size was mildly dilated.   Current Medications:  . aspirin EC  81 mg Oral Daily  . doxazosin  8 mg Oral QHS  . ferrous sulfate  325 mg Oral Q breakfast  . glimepiride  4 mg Oral QAC breakfast  . insulin aspart  0-15 Units Subcutaneous TID WC  . insulin aspart  0-5 Units Subcutaneous QHS  . metoprolol tartrate  12.5 mg Oral 4 times per day  . pantoprazole  40 mg Oral Daily  . sodium chloride  3 mL Intravenous Q12H  . ticagrelor  90 mg Oral BID      ASSESSMENT AND PLAN: 1. NSTEMI (non-ST elevated myocardial infarction) (HCC) - cyclic troponin values have been 0.13, 1.24, and 4.74. - old RBBB on EKG. TWI in V1, V2, V3. - continue ASA and BB. BP's have been soft during hospitalization but are improving. Consider starting ACE-I as outpatient. HR has been brady at times in the 50's. Will decrease Metoprolol 12.5mg  QID to BID in setting of bradycardia and to improve patient compliance in outpatient setting. - LHC on 05/02/2015 showed 90% stenosis in the proximal to distal LAD with successful placing of a long 75mm Synergy DES. Stenosis also noted along inferior branch of D1 and proximal segment of OM1 which will be medically managed at this time. - Has enrolled in the Twilight Study. Brilinta will be provided as part of the study. Will be discharged  on ASA and Brilinta.  2. Diabetes mellitus type 2, noninsulin dependent (HCC) - SSI while admitted. - A1c elevated to 7.3 - follow-up with PCP as outpatient.  3. Hyperlipidemia - LDL at 89. - intolerant to Lipitor in the past. Consider alternative statin or 3x/weekly dosing.  4. Anemia - Hgb at 9.2 on 05/03/2015. - Ferritin low at 7, consistent with iron deficiency anemia. - continue Ferrous Sulfate 325mg  daily.   Arna Medici , PA-C 7:31 AM 05/03/2015 Pager: [phone]  Patient seen, examined. Available data reviewed. Agree with findings, assessment, and plan as outlined by Bernerd Pho, PA-C. Changes made where appropriate. Pt without recurrent CP. Exam as above.  Right groin site with mild ecchymoses, no hematoma or tenderness. Plan DC home this am on ASA and Brilinta per Twilight protocol (he will be randomized to continued ASA vs Placebo at 3 months). Discussed hx of statin-intolerance. Will try crestor 5 mg three times weekly (MWF). FU Dr Stanford Breed. BP has been intermittently low in hospital, but would START ACE-I at follow-up if BP improved.  Sherren Mocha, M.D. 05/03/2015 8:46 AM

## 2015-05-04 ENCOUNTER — Other Ambulatory Visit: Payer: Self-pay | Admitting: *Deleted

## 2015-05-04 MED ORDER — AMBULATORY NON FORMULARY MEDICATION: 81.0000 mg | Freq: Every day | Status: DC

## 2015-05-04 MED ORDER — AMBULATORY NON FORMULARY MEDICATION: 90.0000 mg | Freq: Two times a day (BID) | Status: DC

## 2015-05-13 ENCOUNTER — Ambulatory Visit (INDEPENDENT_AMBULATORY_CARE_PROVIDER_SITE_OTHER): Payer: Commercial Managed Care - HMO | Admitting: Cardiology

## 2015-05-13 ENCOUNTER — Encounter: Payer: Self-pay | Admitting: Cardiology

## 2015-05-13 VITALS — BP 114/58 | HR 72 | Ht 65.5 in | Wt 151.8 lb

## 2015-05-13 DIAGNOSIS — R0600 Dyspnea, unspecified: Secondary | ICD-10-CM

## 2015-05-13 DIAGNOSIS — I2510 Atherosclerotic heart disease of native coronary artery without angina pectoris: Secondary | ICD-10-CM

## 2015-05-13 DIAGNOSIS — E785 Hyperlipidemia, unspecified: Secondary | ICD-10-CM

## 2015-05-13 DIAGNOSIS — D649 Anemia, unspecified: Secondary | ICD-10-CM

## 2015-05-13 DIAGNOSIS — I214 Non-ST elevation (NSTEMI) myocardial infarction: Secondary | ICD-10-CM

## 2015-05-13 LAB — BASIC METABOLIC PANEL WITH GFR
BUN: 24 mg/dL (ref 7–25)
CO2: 21 mmol/L (ref 20–31)
Calcium: 9.2 mg/dL (ref 8.6–10.3)
Chloride: 102 mmol/L (ref 98–110)
Creat: 1.17 mg/dL — ABNORMAL HIGH (ref 0.70–1.11)
Glucose, Bld: 151 mg/dL — ABNORMAL HIGH (ref 65–99)
Potassium: 4.3 mmol/L (ref 3.5–5.3)
Sodium: 136 mmol/L (ref 135–146)

## 2015-05-13 LAB — CBC WITH DIFFERENTIAL/PLATELET
Basophils Absolute: 0.1 K/uL (ref 0.0–0.1)
Basophils Relative: 1 % (ref 0–1)
Eosinophils Absolute: 0.2 K/uL (ref 0.0–0.7)
Eosinophils Relative: 2 % (ref 0–5)
HCT: 30.4 % — ABNORMAL LOW (ref 39.0–52.0)
Hemoglobin: 9.8 g/dL — ABNORMAL LOW (ref 13.0–17.0)
Lymphocytes Relative: 22 % (ref 12–46)
Lymphs Abs: 2.4 K/uL (ref 0.7–4.0)
MCH: 26.9 pg (ref 26.0–34.0)
MCHC: 32.2 g/dL (ref 30.0–36.0)
MCV: 83.5 fL (ref 78.0–100.0)
MPV: 10.2 fL (ref 8.6–12.4)
Monocytes Absolute: 1 K/uL (ref 0.1–1.0)
Monocytes Relative: 9 % (ref 3–12)
Neutro Abs: 7.1 K/uL (ref 1.7–7.7)
Neutrophils Relative %: 66 % (ref 43–77)
Platelets: 286 K/uL (ref 150–400)
RBC: 3.64 MIL/uL — ABNORMAL LOW (ref 4.22–5.81)
RDW: 17.2 % — ABNORMAL HIGH (ref 11.5–15.5)
WBC: 10.8 K/uL — ABNORMAL HIGH (ref 4.0–10.5)

## 2015-05-13 MED ORDER — NITROGLYCERIN 0.4 MG SL SUBL: 0.4000 mg | SUBLINGUAL_TABLET | SUBLINGUAL | Status: DC | PRN

## 2015-05-13 MED ORDER — LISINOPRIL 2.5 MG PO TABS: 2.5000 mg | ORAL_TABLET | Freq: Every day | ORAL | Status: DC

## 2015-05-13 NOTE — Progress Notes (Signed)
Cardiology Office Note   Date:  05/13/2015   ID:  Hayden Garza, DOB 02-01-1933, MRN [id number]  PCP:  Abigail Miyamoto, MD/changing to Dr. Betty Martinique  Cardiologist:  Dr. Stanford Breed    Chief Complaint  Patient presents with  . Hospitalization Follow-up      History of Present Illness: Hayden Garza is a 81 y.o. male who presents for post hospital for NSTEMI and cath with LAD stenosis and rec'd DES synergy stent to LAD. Residual stenosis of mLAD and dLAD residual stenosis of 1st diag.and OM2.     An echocardiogram was obtained which showed an EF of 45% to 50%. Severe hypokinesis of the mid-apicalanteroseptal myocardium was also noted along with grade 2 diastolic dysfunction.  He was discharged with BB but ACE to be added as outpt if BP improved.   Today no complaints, no chest pain or SOb.  He does not wish to attend cardiac rehab and is walking for exercise and staying active.  He and his wife have modified diet. He has no complaints on current crestor dose.  He was placed on iron for anemia.  Past Medical History  Diagnosis Date  . Diabetes mellitus   . BPH (benign prostatic hyperplasia)     With obstruction/lower urinary tract symptoms. Takes cardura for prostate.   Marland Kitchen GERD (gastroesophageal reflux disease)   . Kidney stone   . Skin cancer   . Mixed hyperlipidemia   . Allergic rhinitis   . History of urinary calculi   . Incidental pulmonary nodule   . CAD (coronary artery disease)     a. 05/02/2015: DES to mid LAD    Past Surgical History  Procedure Laterality Date  . Total shoulder replacement    . Appendectomy    . Mohs surgery    . Back surgery    . Cardiac catheterization N/A 05/02/2015    Procedure: Left Heart Cath and Coronary Angiography;  Surgeon: Lorretta Harp, MD;  Location: [facility];  Service: Cardiovascular;  Laterality: N/A;  . Cardiac catheterization N/A 05/02/2015    Procedure: Coronary Stent Intervention;  Surgeon: Lorretta Harp, MD;   Location: [place] [facility];  Service: Cardiovascular;  Laterality: N/A;     Current Outpatient Prescriptions  Medication Sig Dispense Refill  . AMBULATORY NON FORMULARY MEDICATION Take 90 mg by mouth 2 (two) times daily. Medication Name: BRILINTA 90 mg BID (TWILIGHT Research Study provided )    . AMBULATORY NON FORMULARY MEDICATION Take 81 mg by mouth daily. Medication Name: ASA 81 mg DAILY (TWILIGHT Research Study Provided)    . Cholecalciferol 1000 UNITS capsule Take 1,000 Units by mouth daily.    . Coenzyme Q10 (CO Q 10 PO) Take 1 tablet by mouth daily.    Marland Kitchen doxazosin (CARDURA) 8 MG tablet Take 8 mg by mouth at bedtime.     . ferrous sulfate 325 (65 FE) MG tablet Take 1 tablet (325 mg total) by mouth daily with breakfast.  0  . glimepiride (AMARYL) 4 MG tablet Take 4 mg by mouth daily before breakfast.     . metFORMIN (GLUCOPHAGE) 500 MG tablet Take 1,000 mg by mouth 2 (two) times daily with a meal.    . metoprolol tartrate (LOPRESSOR) 25 MG tablet Take 0.5 tablets (12.5 mg total) by mouth 2 (two) times daily. 180 tablet 3  . omeprazole (PRILOSEC) 20 MG capsule Take 20 mg by mouth daily as needed. heartburn    . rosuvastatin (CRESTOR)  10 MG tablet Take 5 mg by mouth every other day.    . zolpidem (AMBIEN) 5 MG tablet Take 5 mg by mouth at bedtime as needed. sleep     No current facility-administered medications for this visit.    Allergies:   Lipitor and Penicillins    Social History:  The patient  reports that he has never smoked. He has never used smokeless tobacco. He reports that he does not drink alcohol or use illicit drugs.   Family History:  The patient's family history includes CAD in his father; Coronary artery disease in his brother and brother; Heart attack in his father.    ROS:  General:no colds or fevers, no weight changes Skin:no rashes or ulcers HEENT:no blurred vision, no congestion CV:see HPI PUL:see HPI GI:no diarrhea constipation or melena, no  indigestion GU:no hematuria, no dysuria MS:no joint pain, no claudication Neuro:no syncope, no lightheadedness Endo:+ diabetes, no thyroid disease  Wt Readings from Last 3 Encounters:  05/13/15 151 lb 12.8 oz (68.856 kg)  05/03/15 153 lb 14.1 oz (69.8 kg)  07/08/13 164 lb (74.39 kg)     PHYSICAL EXAM: VS:  BP 114/58 mmHg  Pulse 72  Ht 5' 5.5" (1.664 m)  Wt 151 lb 12.8 oz (68.856 kg)  BMI 24.87 kg/m2  SpO2 99% , BMI Body mass index is 24.87 kg/(m^2). General:Pleasant affect, NAD Skin:Warm and dry, brisk capillary refill HEENT:normocephalic, sclera clear, mucus membranes moist Neck:supple, no JVD, no bruits  Heart:S1S2 RRR without murmur, gallup, rub or click Lungs:clear without rales, rhonchi, or wheezes [id number], non tender, + BS, do not palpate liver spleen or masses Ext:no lower ext edema, 2+ pedal pulses, 2+ radial pulses-no pain rt groin cath site Neuro:alert and oriented, MAE, follows commands, + facial symmetry    EKG:  EKG is ordered today. The ekg ordered today demonstrates SR LAD RBBB no acute changes since hospital.    Recent Labs: 05/01/2015: ALT 22; Magnesium 1.7; TSH 1.551 05/03/2015: BUN 12; Creatinine, Ser 1.15; Hemoglobin 9.2*; Platelets 216; Potassium 3.9; Sodium 141    Lipid Panel    Component Value Date/Time   CHOL 132 05/01/2015 0339   TRIG 77 05/01/2015 0339   HDL 28* 05/01/2015 0339   CHOLHDL 4.7 05/01/2015 0339   VLDL 15 05/01/2015 0339   LDLCALC 89 05/01/2015 0339       Other studies Reviewed: Additional studies/ records that were reviewed today include: hospital notes, cath, echo..   ASSESSMENT AND PLAN:  1.  Recent NSTEMI with placement of DES to LAD.  On brilinta /asa on twilight study continue BB  Follow up with Dr. Stanford Breed in [place] in 4-6 weeks.  2. CAD residual disease   3. Cardiomyopathy ischemic with improved BP will add low dose ACE today and check BMP  4. Anemia will recheck CBC to make sure hgb improving.  He  will see new PCP in Jan, Dr. Maceo Pro is retiring.   5.  Hyperlipidemia goal <70 he has had intolerance statins and is now on 5 mg crestor every other day.  Will recheck lipid and hepatic in 4-6 weeks.    Current medicines are reviewed with the patient today.  The patient Has no concerns regarding medicines.  The following changes have been made:  See above Labs/ tests ordered today include:see above  Disposition:   FU:  see above  Signed, Isaiah Serge, NP  05/13/2015 1:39 PM    [place], [place]  [place], [place] Phone: [phone]; Fax: [phone]

## 2015-05-13 NOTE — Patient Instructions (Signed)
Medication Instructions:  Your physician has recommended you make the following change in your medication:  1. Start lisinopril ( 2.5 mg ) daily 2.  Nitroglycerin was sent into requested pharmacy.  Take every 5 mintues up to 3 times if no relief call 911   Labwork: Your physician recommends that you have lab work today: bmet/cbc than in 4 weeks fasting labs at Dr. Jacalyn Lefevre office.   Testing/Procedures: -None  Follow-Up: Your physician recommends that you keep your scheduled  follow-up appointment with Dr. Stanford Breed.   Any Other Special Instructions Will Be Listed Below (If Applicable).     If you need a refill on your cardiac medications before your next appointment, please call your pharmacy.

## 2015-05-14 ENCOUNTER — Encounter: Payer: Self-pay | Admitting: Cardiology

## 2015-05-16 ENCOUNTER — Telehealth: Payer: Self-pay

## 2015-05-16 DIAGNOSIS — R899 Unspecified abnormal finding in specimens from other organs, systems and tissues: Secondary | ICD-10-CM

## 2015-05-16 NOTE — Telephone Encounter (Signed)
Patient called back about lab results. Per Cecilie Kicks NP, hgb slowly increasing. Kidney function slightly abnormal recheck BMP in 1 week. Patient verbalized understanding. Patient will come in for BMET 05/24/15.

## 2015-05-24 ENCOUNTER — Other Ambulatory Visit (INDEPENDENT_AMBULATORY_CARE_PROVIDER_SITE_OTHER): Payer: Commercial Managed Care - HMO | Admitting: *Deleted

## 2015-05-24 DIAGNOSIS — R899 Unspecified abnormal finding in specimens from other organs, systems and tissues: Secondary | ICD-10-CM

## 2015-05-24 LAB — BASIC METABOLIC PANEL
BUN: 22 mg/dL (ref 7–25)
CALCIUM: 9.1 mg/dL (ref 8.6–10.3)
CO2: 24 mmol/L (ref 20–31)
CREATININE: 1.1 mg/dL (ref 0.70–1.11)
Chloride: 103 mmol/L (ref 98–110)
Glucose, Bld: 110 mg/dL — ABNORMAL HIGH (ref 65–99)
POTASSIUM: 4.3 mmol/L (ref 3.5–5.3)
Sodium: 139 mmol/L (ref 135–146)

## 2015-05-25 ENCOUNTER — Telehealth: Payer: Self-pay | Admitting: *Deleted

## 2015-05-25 ENCOUNTER — Telehealth: Payer: Self-pay | Admitting: Cardiology

## 2015-05-25 NOTE — Telephone Encounter (Signed)
error 

## 2015-05-25 NOTE — Telephone Encounter (Signed)
-----   Message from Isaiah Serge, NP sent at 05/25/2015  6:58 AM EST ----- Labs at normal, keep follow up with Dr. Stanford Breed.

## 2015-05-25 NOTE — Telephone Encounter (Signed)
Called pt to let him know that his labs were normal and to keep his f/u appt with Dr. Stanford Breed as scheduled.  Pt verbalized understanding.

## 2015-05-25 NOTE — Telephone Encounter (Signed)
New message ° ° ° ° °Returning a call to the nurse to get lab results °

## 2015-06-01 ENCOUNTER — Encounter: Payer: Self-pay | Admitting: *Deleted

## 2015-06-02 ENCOUNTER — Encounter: Payer: Self-pay | Admitting: *Deleted

## 2015-06-02 DIAGNOSIS — Z006 Encounter for examination for normal comparison and control in clinical research program: Secondary | ICD-10-CM

## 2015-06-02 NOTE — Progress Notes (Signed)
TWILIGHT Research study 1 month telephone follow up completed. Patient denies any adverse events or bleeding events. States compliant with study medication. Next visit due feb.

## 2015-06-14 NOTE — Progress Notes (Signed)
HPI: FU CAD. Admitted 11/16 with NSTEMI; cath revealed 40 RCA, 90 D1, 90 mild LAD, 95 distal LAD, 90 OM 2; had PCI of mid LAD with DES. Echo 11/16 showed EF [id number], grade 2 diastolic dysfunction, mild MR, mild LAE and RVE. Since last seen, the patient has dyspnea with more extreme activities but not with routine activities. It is relieved with rest. It is not associated with chest pain. There is no orthopnea, PND or pedal edema. There is no syncope or palpitations. There is no exertional chest pain.   Current Outpatient Prescriptions  Medication Sig Dispense Refill  . AMBULATORY NON FORMULARY MEDICATION Take 90 mg by mouth 2 (two) times daily. Medication Name: BRILINTA 90 mg BID (TWILIGHT Research Study provided )    . AMBULATORY NON FORMULARY MEDICATION Take 81 mg by mouth daily. Medication Name: ASA 81 mg DAILY (TWILIGHT Research Study Provided)    . Cholecalciferol 1000 UNITS capsule Take 1,000 Units by mouth daily.    . Coenzyme Q10 (CO Q 10 PO) Take 1 tablet by mouth daily.    Marland Kitchen doxazosin (CARDURA) 8 MG tablet Take 8 mg by mouth at bedtime.     . ferrous sulfate 325 (65 FE) MG tablet Take 1 tablet (325 mg total) by mouth daily with breakfast.  0  . glimepiride (AMARYL) 4 MG tablet Take 4 mg by mouth daily before breakfast.     . lisinopril (PRINIVIL,ZESTRIL) 2.5 MG tablet Take 1 tablet (2.5 mg total) by mouth daily. 30 tablet 11  . metFORMIN (GLUCOPHAGE) 500 MG tablet Take 1,000 mg by mouth 2 (two) times daily with a meal.    . metoprolol tartrate (LOPRESSOR) 25 MG tablet Take 0.5 tablets (12.5 mg total) by mouth 2 (two) times daily. 180 tablet 3  . nitroGLYCERIN (NITROSTAT) 0.4 MG SL tablet Place 1 tablet (0.4 mg total) under the tongue every 5 (five) minutes as needed for chest pain. 25 tablet 3  . omeprazole (PRILOSEC) 20 MG capsule Take 20 mg by mouth daily as needed. heartburn    . rosuvastatin (CRESTOR) 10 MG tablet Take 5 mg by mouth every other day.    . zolpidem (AMBIEN) 5 MG  tablet Take 5 mg by mouth at bedtime as needed. sleep     No current facility-administered medications for this visit.     Past Medical History  Diagnosis Date  . Diabetes mellitus   . BPH (benign prostatic hyperplasia)     With obstruction/lower urinary tract symptoms. Takes cardura for prostate.   Marland Kitchen GERD (gastroesophageal reflux disease)   . Kidney stone   . Skin cancer   . Mixed hyperlipidemia   . Allergic rhinitis   . History of urinary calculi   . Incidental pulmonary nodule   . CAD (coronary artery disease)     a. 05/02/2015: DES to mid LAD    Past Surgical History  Procedure Laterality Date  . Total shoulder replacement    . Appendectomy    . Mohs surgery    . Back surgery    . Cardiac catheterization N/A 05/02/2015    Procedure: Left Heart Cath and Coronary Angiography;  Surgeon: Lorretta Harp, MD;  Location: [facility];  Service: Cardiovascular;  Laterality: N/A;  . Cardiac catheterization N/A 05/02/2015    Procedure: Coronary Stent Intervention;  Surgeon: Lorretta Harp, MD;  Location: [facility];  Service: Cardiovascular;  Laterality: N/A;    Social History   Social History  .  Marital Status: Married    Spouse Name: N/A  . Number of Children: 1  . Years of Education: N/A   Occupational History  . Retired    Social History Main Topics  . Smoking status: Never Smoker   . Smokeless tobacco: Never Used  . Alcohol Use: No  . Drug Use: No  . Sexual Activity: Not on file   Other Topics Concern  . Not on file   Social History Narrative   Lives in [place], [place] with wife.    ROS: Arthralgias but no fevers or chills, productive cough, hemoptysis, dysphasia, odynophagia, melena, hematochezia, dysuria, hematuria, rash, seizure activity, orthopnea, PND, pedal edema, claudication. Remaining systems are negative.  Physical Exam: Well-developed well-nourished in no acute distress.  Skin is warm and dry.  HEENT is normal.  Neck is supple.    Chest is clear to auscultation with normal expansion.  Cardiovascular exam is regular rate and rhythm.  Abdominal exam nontender or distended. No masses palpated. Extremities show no edema. neuro grossly intact

## 2015-06-15 ENCOUNTER — Ambulatory Visit (INDEPENDENT_AMBULATORY_CARE_PROVIDER_SITE_OTHER): Payer: Commercial Managed Care - HMO | Admitting: Cardiology

## 2015-06-15 ENCOUNTER — Encounter: Payer: Self-pay | Admitting: Cardiology

## 2015-06-15 VITALS — BP 114/64 | HR 72 | Ht 65.5 in | Wt 153.1 lb

## 2015-06-15 DIAGNOSIS — E785 Hyperlipidemia, unspecified: Secondary | ICD-10-CM

## 2015-06-15 DIAGNOSIS — I10 Essential (primary) hypertension: Secondary | ICD-10-CM

## 2015-06-15 DIAGNOSIS — I2510 Atherosclerotic heart disease of native coronary artery without angina pectoris: Secondary | ICD-10-CM

## 2015-06-15 DIAGNOSIS — D649 Anemia, unspecified: Secondary | ICD-10-CM | POA: Insufficient documentation

## 2015-06-15 HISTORY — DX: Essential (primary) hypertension: I10

## 2015-06-15 MED ORDER — ROSUVASTATIN CALCIUM 10 MG PO TABS: 5.0000 mg | ORAL_TABLET | Freq: Every day | ORAL | Status: DC

## 2015-06-15 NOTE — Assessment & Plan Note (Signed)
Patient instructed to follow-up with primary care for further evaluation of this issue.

## 2015-06-15 NOTE — Assessment & Plan Note (Signed)
Continue aspirin, brilinta and statin. 

## 2015-06-15 NOTE — Assessment & Plan Note (Signed)
Patient has had difficulties with statins previously. He did not tolerate Lipitor. Increase Crestor to 5 mg daily to see if he tolerates. Check lipids and liver in 4 weeks.

## 2015-06-15 NOTE — Patient Instructions (Addendum)
Medication Instructions:   INCREASE CRESTOR TO 5 MG ONCE DAILY= 1/2 10 MG TABLET ONCE DAILY  Labwork:  Your physician recommends that you return for lab work in: 4 WEEKS= DO NOT EAT PRIOR TO LAB WORK  Follow-Up:  Your physician wants you to follow-up in: [place] will receive a reminder letter in the mail two months in advance. If you don't receive a letter, please call our office to schedule the follow-up appointment.   If you need a refill on your cardiac medications before your next appointment, please call your pharmacy.

## 2015-06-15 NOTE — Assessment & Plan Note (Signed)
Blood pressure controlled. Continue present medications. 

## 2015-06-24 DIAGNOSIS — E782 Mixed hyperlipidemia: Secondary | ICD-10-CM | POA: Diagnosis not present

## 2015-06-24 DIAGNOSIS — I10 Essential (primary) hypertension: Secondary | ICD-10-CM | POA: Diagnosis not present

## 2015-06-24 DIAGNOSIS — E1165 Type 2 diabetes mellitus with hyperglycemia: Secondary | ICD-10-CM | POA: Diagnosis not present

## 2015-06-24 DIAGNOSIS — Z7984 Long term (current) use of oral hypoglycemic drugs: Secondary | ICD-10-CM | POA: Diagnosis not present

## 2015-06-24 DIAGNOSIS — E119 Type 2 diabetes mellitus without complications: Secondary | ICD-10-CM | POA: Diagnosis not present

## 2015-06-24 DIAGNOSIS — D509 Iron deficiency anemia, unspecified: Secondary | ICD-10-CM | POA: Diagnosis not present

## 2015-07-18 DIAGNOSIS — I2510 Atherosclerotic heart disease of native coronary artery without angina pectoris: Secondary | ICD-10-CM | POA: Diagnosis not present

## 2015-07-18 LAB — LIPID PANEL
CHOL/HDL RATIO: 3 ratio (ref <=5.0)
CHOLESTEROL: 83 mg/dL — AB (ref 125–200)
HDL: 28 mg/dL — AB (ref >=40)
LDL Cholesterol: 34 mg/dL (ref <130)
TRIGLYCERIDES: 105 mg/dL (ref <150)
VLDL: 21 mg/dL (ref <30)

## 2015-07-18 LAB — HEPATIC FUNCTION PANEL
ALT: 14 U/L (ref 9–46)
AST: 14 U/L (ref 10–35)
Albumin: 3.8 g/dL (ref 3.6–5.1)
Alkaline Phosphatase: 66 U/L (ref 40–115)
Bilirubin, Direct: 0.1 mg/dL (ref <=0.2)
Indirect Bilirubin: 0.2 mg/dL (ref 0.2–1.2)
TOTAL PROTEIN: 6.7 g/dL (ref 6.1–8.1)
Total Bilirubin: 0.3 mg/dL (ref 0.2–1.2)

## 2015-07-19 ENCOUNTER — Encounter: Payer: Self-pay | Admitting: *Deleted

## 2015-08-02 ENCOUNTER — Other Ambulatory Visit: Payer: Self-pay | Admitting: *Deleted

## 2015-08-02 ENCOUNTER — Encounter: Payer: Self-pay | Admitting: *Deleted

## 2015-08-02 DIAGNOSIS — Z006 Encounter for examination for normal comparison and control in clinical research program: Secondary | ICD-10-CM

## 2015-08-02 MED ORDER — AMBULATORY NON FORMULARY MEDICATION: 81.0000 mg | Freq: Every day | Status: DC

## 2015-08-02 NOTE — Progress Notes (Signed)
TWILIGHT Research study 3 month Randomization visit completed. Patient Randomized to ASA 91 mg or PLACEBO. Instructed patient to take only study provided ASA/placebo and no open label ASA. Patient denies any adverse or bleeding events. He also states compliant with medication. Patient returned [id number] 9 pills & [id number] 27 pills returned. Dispensed Bottle # [id number], [id number], [id number]. Questions encouraged and answered. Research follow up schedule given to patient.

## 2015-09-06 ENCOUNTER — Encounter: Payer: Self-pay | Admitting: *Deleted

## 2015-09-06 DIAGNOSIS — Z006 Encounter for examination for normal comparison and control in clinical research program: Secondary | ICD-10-CM

## 2015-09-06 NOTE — Progress Notes (Signed)
TWILIGHT Research 4 month telephone follow up completed. Patient denies any adverse events or bleeding events. States compliant with medication. Next study visit Questions encouraged and answered.

## 2015-09-19 DIAGNOSIS — R6889 Other general symptoms and signs: Secondary | ICD-10-CM | POA: Diagnosis not present

## 2015-09-19 DIAGNOSIS — J069 Acute upper respiratory infection, unspecified: Secondary | ICD-10-CM | POA: Diagnosis not present

## 2015-09-19 DIAGNOSIS — J029 Acute pharyngitis, unspecified: Secondary | ICD-10-CM | POA: Diagnosis not present

## 2015-10-10 DIAGNOSIS — L570 Actinic keratosis: Secondary | ICD-10-CM | POA: Diagnosis not present

## 2015-10-10 DIAGNOSIS — Z08 Encounter for follow-up examination after completed treatment for malignant neoplasm: Secondary | ICD-10-CM | POA: Diagnosis not present

## 2015-10-10 DIAGNOSIS — L821 Other seborrheic keratosis: Secondary | ICD-10-CM | POA: Diagnosis not present

## 2015-10-10 DIAGNOSIS — Z85828 Personal history of other malignant neoplasm of skin: Secondary | ICD-10-CM | POA: Diagnosis not present

## 2015-10-13 ENCOUNTER — Telehealth: Payer: Self-pay | Admitting: Cardiology

## 2015-10-13 NOTE — Telephone Encounter (Signed)
Hayden Garza is calling to see if [name] will need any pre-medication before he has a cleaning and check up . Please call  Thanks

## 2015-10-13 NOTE — Telephone Encounter (Signed)
Returned call to [place] with Dr.Ribando's office.Patient does not need any pre medication before dental cleaning.

## 2015-10-26 DIAGNOSIS — C44519 Basal cell carcinoma of skin of other part of trunk: Secondary | ICD-10-CM | POA: Diagnosis not present

## 2015-11-22 DIAGNOSIS — E782 Mixed hyperlipidemia: Secondary | ICD-10-CM | POA: Diagnosis not present

## 2015-11-22 DIAGNOSIS — I10 Essential (primary) hypertension: Secondary | ICD-10-CM | POA: Diagnosis not present

## 2015-11-22 DIAGNOSIS — F5101 Primary insomnia: Secondary | ICD-10-CM | POA: Diagnosis not present

## 2015-11-22 DIAGNOSIS — E119 Type 2 diabetes mellitus without complications: Secondary | ICD-10-CM | POA: Diagnosis not present

## 2015-12-07 NOTE — Progress Notes (Signed)
HPI: FU CAD. Admitted 11/16 with NSTEMI; cath revealed 40 RCA, 90 D1, 90 mild LAD, 95 distal LAD, 90 OM 2; had PCI of mid LAD with DES. Echo 11/16 showed EF [id number], grade 2 diastolic dysfunction, mild MR, mild LAE and RVE. Since last seen, the patient has dyspnea with more extreme activities but not with routine activities. It is relieved with rest. It is not associated with chest pain. There is no orthopnea, PND or pedal edema. There is no syncope or palpitations. There is no exertional chest pain.   Current Outpatient Prescriptions  Medication Sig Dispense Refill  . AMBULATORY NON FORMULARY MEDICATION Take 90 mg by mouth 2 (two) times daily. Medication Name: BRILINTA 90 mg BID (TWILIGHT Research Study provided )    . AMBULATORY NON FORMULARY MEDICATION Take 81 mg by mouth daily. Medication Name: ASA 81 mg or PLACEBO (TWILIGHT Research study provided, do not take open label ASA)    . Cholecalciferol 1000 UNITS capsule Take 1,000 Units by mouth daily.    . Coenzyme Q10 (CO Q 10 PO) Take 1 tablet by mouth daily.    Marland Kitchen doxazosin (CARDURA) 8 MG tablet Take 8 mg by mouth at bedtime.     . ferrous sulfate 325 (65 FE) MG tablet Take 1 tablet (325 mg total) by mouth daily with breakfast.  0  . glimepiride (AMARYL) 4 MG tablet Take 4 mg by mouth daily before breakfast.     . lisinopril (PRINIVIL,ZESTRIL) 2.5 MG tablet Take 1 tablet (2.5 mg total) by mouth daily. 30 tablet 11  . metFORMIN (GLUCOPHAGE) 500 MG tablet Take 1,000 mg by mouth 2 (two) times daily with a meal.    . metoprolol tartrate (LOPRESSOR) 25 MG tablet Take 0.5 tablets (12.5 mg total) by mouth 2 (two) times daily. 180 tablet 3  . nitroGLYCERIN (NITROSTAT) 0.4 MG SL tablet Place 1 tablet (0.4 mg total) under the tongue every 5 (five) minutes as needed for chest pain. 25 tablet 3  . omeprazole (PRILOSEC) 20 MG capsule Take 20 mg by mouth daily as needed. heartburn    . rosuvastatin (CRESTOR) 10 MG tablet Take 0.5 tablets (5 mg total)  by mouth daily. 90 tablet 3  . zolpidem (AMBIEN) 5 MG tablet Take 5 mg by mouth at bedtime as needed. sleep     No current facility-administered medications for this visit.     Past Medical History  Diagnosis Date  . Diabetes mellitus   . BPH (benign prostatic hyperplasia)     With obstruction/lower urinary tract symptoms. Takes cardura for prostate.   Marland Kitchen GERD (gastroesophageal reflux disease)   . Kidney stone   . Skin cancer   . Mixed hyperlipidemia   . Allergic rhinitis   . History of urinary calculi   . Incidental pulmonary nodule   . CAD (coronary artery disease)     a. 05/02/2015: DES to mid LAD  . Essential hypertension 06/15/2015    Past Surgical History  Procedure Laterality Date  . Total shoulder replacement    . Appendectomy    . Mohs surgery    . Back surgery    . Cardiac catheterization N/A 05/02/2015    Procedure: Left Heart Cath and Coronary Angiography;  Surgeon: Lorretta Harp, MD;  Location: [facility];  Service: Cardiovascular;  Laterality: N/A;  . Cardiac catheterization N/A 05/02/2015    Procedure: Coronary Stent Intervention;  Surgeon: Lorretta Harp, MD;  Location: [facility];  Service: Cardiovascular;  Laterality: N/A;    Social History   Social History  . Marital Status: Married    Spouse Name: N/A  . Number of Children: 1  . Years of Education: N/A   Occupational History  . Retired    Social History Main Topics  . Smoking status: Never Smoker   . Smokeless tobacco: Never Used  . Alcohol Use: No  . Drug Use: No  . Sexual Activity: Not on file   Other Topics Concern  . Not on file   Social History Narrative   Lives in [place], [place] with wife.    Family History  Problem Relation Age of Onset  . Heart attack Father   . CAD Father   . Coronary artery disease Brother     died in his 37s after a fall  . Coronary artery disease Brother     dx mid-60s  . Colitis Sister     ROS: no fevers or chills, productive cough,  hemoptysis, dysphasia, odynophagia, melena, hematochezia, dysuria, hematuria, rash, seizure activity, orthopnea, PND, pedal edema, claudication. Remaining systems are negative.  Physical Exam: Well-developed well-nourished in no acute distress.  Skin is warm and dry.  HEENT is normal.  Neck is supple.  Chest is clear to auscultation with normal expansion.  Cardiovascular exam is regular rate and rhythm.  Abdominal exam nontender or distended. No masses palpated. Extremities show no edema. neuro grossly intact  ECG Sinus rhythm at a rate of 59. Right bundle branch block. Inferior infarct.  Assessment and plan 1 coronary artery disease-continue statin. Continue Twilight study drug. 2 hyperlipidemia-continue present dose of Crestor. He did not tolerate higher doses previously.   3 hypertension-blood pressure controlled. Continue present medications. 4 Anemia-patient again asked to follow-up with primary care physician.  Kirk Ruths, MD

## 2015-12-14 ENCOUNTER — Ambulatory Visit (INDEPENDENT_AMBULATORY_CARE_PROVIDER_SITE_OTHER): Payer: Commercial Managed Care - HMO | Admitting: Cardiology

## 2015-12-14 ENCOUNTER — Encounter: Payer: Self-pay | Admitting: Cardiology

## 2015-12-14 VITALS — BP 109/70 | HR 59 | Ht 65.5 in | Wt 152.4 lb

## 2015-12-14 DIAGNOSIS — E785 Hyperlipidemia, unspecified: Secondary | ICD-10-CM

## 2015-12-14 DIAGNOSIS — I10 Essential (primary) hypertension: Secondary | ICD-10-CM | POA: Diagnosis not present

## 2015-12-14 DIAGNOSIS — D649 Anemia, unspecified: Secondary | ICD-10-CM | POA: Diagnosis not present

## 2015-12-14 DIAGNOSIS — I2510 Atherosclerotic heart disease of native coronary artery without angina pectoris: Secondary | ICD-10-CM | POA: Diagnosis not present

## 2015-12-14 NOTE — Patient Instructions (Signed)
Your physician wants you to follow-up in: ONE YEAR WITH DR CRENSHAW You will receive a reminder letter in the mail two months in advance. If you don't receive a letter, please call our office to schedule the follow-up appointment.   If you need a refill on your cardiac medications before your next appointment, please call your pharmacy.  

## 2015-12-26 DIAGNOSIS — H5210 Myopia, unspecified eye: Secondary | ICD-10-CM | POA: Diagnosis not present

## 2015-12-26 DIAGNOSIS — H52209 Unspecified astigmatism, unspecified eye: Secondary | ICD-10-CM | POA: Diagnosis not present

## 2015-12-26 DIAGNOSIS — E119 Type 2 diabetes mellitus without complications: Secondary | ICD-10-CM | POA: Diagnosis not present

## 2016-01-12 ENCOUNTER — Encounter: Payer: Self-pay | Admitting: *Deleted

## 2016-01-12 DIAGNOSIS — Z006 Encounter for examination for normal comparison and control in clinical research program: Secondary | ICD-10-CM

## 2016-01-12 NOTE — Progress Notes (Signed)
TWILIGHT Research study 9 month follow up visit completed. Patient denies any bleeding or other adverse events. He forgot to return brilinta and study ASA/placebo for pill count. Wife called office after returning home and stated he had 69 brilinta and 37 ASA/placebo remaining and this assures he has been 100% compliant. Dispensed bottle numbers [id number]. Next research visit window is 06/25/16-08/24/16 and this will be his last visit, he will be returning brilinta ans Study drug. Antiplatelet therapy after that visit will be at the discretion of his cardiologist. Questions encouraged and answered.

## 2016-02-27 DIAGNOSIS — Z23 Encounter for immunization: Secondary | ICD-10-CM | POA: Diagnosis not present

## 2016-02-27 DIAGNOSIS — R7309 Other abnormal glucose: Secondary | ICD-10-CM | POA: Diagnosis not present

## 2016-02-27 DIAGNOSIS — D509 Iron deficiency anemia, unspecified: Secondary | ICD-10-CM | POA: Diagnosis not present

## 2016-05-18 DIAGNOSIS — J069 Acute upper respiratory infection, unspecified: Secondary | ICD-10-CM | POA: Diagnosis not present

## 2016-05-25 DIAGNOSIS — E119 Type 2 diabetes mellitus without complications: Secondary | ICD-10-CM | POA: Diagnosis not present

## 2016-05-25 DIAGNOSIS — E782 Mixed hyperlipidemia: Secondary | ICD-10-CM | POA: Diagnosis not present

## 2016-05-25 DIAGNOSIS — I10 Essential (primary) hypertension: Secondary | ICD-10-CM | POA: Diagnosis not present

## 2016-05-25 DIAGNOSIS — F5101 Primary insomnia: Secondary | ICD-10-CM | POA: Diagnosis not present

## 2016-05-25 DIAGNOSIS — R05 Cough: Secondary | ICD-10-CM | POA: Diagnosis not present

## 2016-07-09 DIAGNOSIS — Z08 Encounter for follow-up examination after completed treatment for malignant neoplasm: Secondary | ICD-10-CM | POA: Diagnosis not present

## 2016-07-09 DIAGNOSIS — C44212 Basal cell carcinoma of skin of right ear and external auricular canal: Secondary | ICD-10-CM | POA: Diagnosis not present

## 2016-07-09 DIAGNOSIS — C44319 Basal cell carcinoma of skin of other parts of face: Secondary | ICD-10-CM | POA: Diagnosis not present

## 2016-07-09 DIAGNOSIS — L570 Actinic keratosis: Secondary | ICD-10-CM | POA: Diagnosis not present

## 2016-07-09 DIAGNOSIS — Z85828 Personal history of other malignant neoplasm of skin: Secondary | ICD-10-CM | POA: Diagnosis not present

## 2016-08-08 ENCOUNTER — Telehealth: Payer: Self-pay | Admitting: *Deleted

## 2016-08-08 NOTE — Telephone Encounter (Signed)
Spoke with pt, Aware of dr crenshaw's recommendations.  °

## 2016-08-08 NOTE — Telephone Encounter (Signed)
-----   Message from Lelon Perla, MD sent at 08/07/2016  3:17 PM EST ----- Regarding: FW: End of treatment TWILIGHT Research Study Once study drug complete, pt should take asa 325 mg daily Kirk Ruths  ----- Message ----- From: Karilyn Cota, RN Sent: 08/07/2016  11:26 AM To: Lelon Perla, MD, Jeanann Lewandowsky, RMA Subject: End of treatment TWILIGHT Research Study       Dr. Stanford Breed,    Mr. Sandelin has a research appointment 08-21-16. This will be his "end of treatment" and no longer be receiving Brilinta or ASA/Placebo.  Further antiplatelet ( Including ASA) will be at your discretion and a prescription will need to be sent to his pharmacy.  Thank you Tammy

## 2016-08-20 ENCOUNTER — Other Ambulatory Visit: Payer: Self-pay | Admitting: *Deleted

## 2016-08-20 ENCOUNTER — Encounter: Payer: Self-pay | Admitting: *Deleted

## 2016-08-20 DIAGNOSIS — Z006 Encounter for examination for normal comparison and control in clinical research program: Secondary | ICD-10-CM

## 2016-08-20 MED ORDER — ASPIRIN EC 325 MG PO TBEC: 325.0000 mg | DELAYED_RELEASE_TABLET | Freq: Every day | ORAL | 0 refills | Status: DC

## 2016-08-20 NOTE — Progress Notes (Signed)
TWILIGHT Research month 15 visit completed over the phone. Patient has been instructed to start ASA 325 mg daily after stopping the Brilinta and ASA/placebo. Patient will call me back with number of Brilinta and ASA/Placebo for compliance check. Patient plans on starting ASA 325 mg tomorrow. Questions encouraged and answered. I explained to patient the TWILIGHT study follow up includes 1 more telephone call before the end on may. Questions encouraged and answered.

## 2016-09-19 ENCOUNTER — Other Ambulatory Visit: Payer: Self-pay | Admitting: Cardiology

## 2016-09-19 ENCOUNTER — Telehealth: Payer: Self-pay | Admitting: Cardiology

## 2016-09-19 NOTE — Telephone Encounter (Signed)
Patient wife calling states that patient has a prescriptions for Aspirin EC 325 mg and states that through insurance it is $12.98. Mrs. Sanor would like to know if patient can take an over-the-counter brand and if it will be as effective as the Aspirin prescription. Thanks.

## 2016-09-19 NOTE — Telephone Encounter (Signed)
Returned the phone call to the patient's wife (per DPR). She stated that she would rather buy over the counter 325 mg Aspirin than spend the $12.98 for the prescription.

## 2016-10-18 ENCOUNTER — Telehealth: Payer: Self-pay | Admitting: *Deleted

## 2016-10-18 NOTE — Telephone Encounter (Signed)
TWILIGHT Research study month 18 telephone follow up completed. No bleeding events and he is still taking ASA 325 mg daily. I thanked him for participating in the study.

## 2016-11-09 DIAGNOSIS — E119 Type 2 diabetes mellitus without complications: Secondary | ICD-10-CM | POA: Diagnosis not present

## 2016-11-09 DIAGNOSIS — Z7984 Long term (current) use of oral hypoglycemic drugs: Secondary | ICD-10-CM | POA: Diagnosis not present

## 2016-11-09 DIAGNOSIS — Z0000 Encounter for general adult medical examination without abnormal findings: Secondary | ICD-10-CM | POA: Diagnosis not present

## 2016-11-09 DIAGNOSIS — F5101 Primary insomnia: Secondary | ICD-10-CM | POA: Diagnosis not present

## 2016-11-09 DIAGNOSIS — Z23 Encounter for immunization: Secondary | ICD-10-CM | POA: Diagnosis not present

## 2016-11-09 DIAGNOSIS — I10 Essential (primary) hypertension: Secondary | ICD-10-CM | POA: Diagnosis not present

## 2016-11-09 DIAGNOSIS — E782 Mixed hyperlipidemia: Secondary | ICD-10-CM | POA: Diagnosis not present

## 2016-11-09 DIAGNOSIS — N401 Enlarged prostate with lower urinary tract symptoms: Secondary | ICD-10-CM | POA: Diagnosis not present

## 2016-11-20 DIAGNOSIS — Z1211 Encounter for screening for malignant neoplasm of colon: Secondary | ICD-10-CM | POA: Diagnosis not present

## 2017-01-03 DIAGNOSIS — H524 Presbyopia: Secondary | ICD-10-CM | POA: Diagnosis not present

## 2017-01-03 DIAGNOSIS — E119 Type 2 diabetes mellitus without complications: Secondary | ICD-10-CM | POA: Diagnosis not present

## 2017-01-28 DIAGNOSIS — Z08 Encounter for follow-up examination after completed treatment for malignant neoplasm: Secondary | ICD-10-CM | POA: Diagnosis not present

## 2017-01-28 DIAGNOSIS — Z85828 Personal history of other malignant neoplasm of skin: Secondary | ICD-10-CM | POA: Diagnosis not present

## 2017-01-28 DIAGNOSIS — L821 Other seborrheic keratosis: Secondary | ICD-10-CM | POA: Diagnosis not present

## 2017-03-13 DIAGNOSIS — Z23 Encounter for immunization: Secondary | ICD-10-CM | POA: Diagnosis not present

## 2017-04-03 DIAGNOSIS — E119 Type 2 diabetes mellitus without complications: Secondary | ICD-10-CM | POA: Diagnosis not present

## 2017-05-10 DIAGNOSIS — I10 Essential (primary) hypertension: Secondary | ICD-10-CM | POA: Diagnosis not present

## 2017-05-10 DIAGNOSIS — D509 Iron deficiency anemia, unspecified: Secondary | ICD-10-CM | POA: Diagnosis not present

## 2017-05-10 DIAGNOSIS — F5101 Primary insomnia: Secondary | ICD-10-CM | POA: Diagnosis not present

## 2017-05-10 DIAGNOSIS — E119 Type 2 diabetes mellitus without complications: Secondary | ICD-10-CM | POA: Diagnosis not present

## 2017-05-10 DIAGNOSIS — E782 Mixed hyperlipidemia: Secondary | ICD-10-CM | POA: Diagnosis not present

## 2017-05-10 DIAGNOSIS — Z7984 Long term (current) use of oral hypoglycemic drugs: Secondary | ICD-10-CM | POA: Diagnosis not present

## 2017-05-10 DIAGNOSIS — N401 Enlarged prostate with lower urinary tract symptoms: Secondary | ICD-10-CM | POA: Diagnosis not present

## 2017-06-03 DIAGNOSIS — N401 Enlarged prostate with lower urinary tract symptoms: Secondary | ICD-10-CM | POA: Diagnosis not present

## 2017-06-03 DIAGNOSIS — N200 Calculus of kidney: Secondary | ICD-10-CM | POA: Diagnosis not present

## 2017-06-03 DIAGNOSIS — R3912 Poor urinary stream: Secondary | ICD-10-CM | POA: Diagnosis not present

## 2017-09-02 DIAGNOSIS — D485 Neoplasm of uncertain behavior of skin: Secondary | ICD-10-CM | POA: Diagnosis not present

## 2017-09-02 DIAGNOSIS — Z85828 Personal history of other malignant neoplasm of skin: Secondary | ICD-10-CM | POA: Diagnosis not present

## 2017-09-02 DIAGNOSIS — C44311 Basal cell carcinoma of skin of nose: Secondary | ICD-10-CM | POA: Diagnosis not present

## 2017-09-02 DIAGNOSIS — Z08 Encounter for follow-up examination after completed treatment for malignant neoplasm: Secondary | ICD-10-CM | POA: Diagnosis not present

## 2017-10-24 DIAGNOSIS — D485 Neoplasm of uncertain behavior of skin: Secondary | ICD-10-CM | POA: Diagnosis not present

## 2017-10-24 DIAGNOSIS — C44311 Basal cell carcinoma of skin of nose: Secondary | ICD-10-CM | POA: Diagnosis not present

## 2017-11-05 DIAGNOSIS — E119 Type 2 diabetes mellitus without complications: Secondary | ICD-10-CM | POA: Diagnosis not present

## 2017-11-05 DIAGNOSIS — N401 Enlarged prostate with lower urinary tract symptoms: Secondary | ICD-10-CM | POA: Diagnosis not present

## 2017-11-05 DIAGNOSIS — Z7984 Long term (current) use of oral hypoglycemic drugs: Secondary | ICD-10-CM | POA: Diagnosis not present

## 2017-11-05 DIAGNOSIS — E782 Mixed hyperlipidemia: Secondary | ICD-10-CM | POA: Diagnosis not present

## 2017-11-05 DIAGNOSIS — D509 Iron deficiency anemia, unspecified: Secondary | ICD-10-CM | POA: Diagnosis not present

## 2017-11-05 DIAGNOSIS — Z0000 Encounter for general adult medical examination without abnormal findings: Secondary | ICD-10-CM | POA: Diagnosis not present

## 2017-11-05 DIAGNOSIS — I10 Essential (primary) hypertension: Secondary | ICD-10-CM | POA: Diagnosis not present

## 2017-11-05 DIAGNOSIS — F5101 Primary insomnia: Secondary | ICD-10-CM | POA: Diagnosis not present

## 2017-12-30 DIAGNOSIS — C44311 Basal cell carcinoma of skin of nose: Secondary | ICD-10-CM | POA: Diagnosis not present

## 2018-01-09 DIAGNOSIS — M7989 Other specified soft tissue disorders: Secondary | ICD-10-CM | POA: Diagnosis not present

## 2018-02-05 DIAGNOSIS — Z0100 Encounter for examination of eyes and vision without abnormal findings: Secondary | ICD-10-CM | POA: Diagnosis not present

## 2018-02-05 DIAGNOSIS — H524 Presbyopia: Secondary | ICD-10-CM | POA: Diagnosis not present

## 2018-02-06 DIAGNOSIS — M722 Plantar fascial fibromatosis: Secondary | ICD-10-CM | POA: Diagnosis not present

## 2018-02-12 DIAGNOSIS — E139 Other specified diabetes mellitus without complications: Secondary | ICD-10-CM | POA: Diagnosis not present

## 2018-02-12 DIAGNOSIS — M722 Plantar fascial fibromatosis: Secondary | ICD-10-CM | POA: Diagnosis not present

## 2018-02-12 DIAGNOSIS — M79672 Pain in left foot: Secondary | ICD-10-CM | POA: Diagnosis not present

## 2018-02-25 DIAGNOSIS — M722 Plantar fascial fibromatosis: Secondary | ICD-10-CM | POA: Diagnosis not present

## 2018-02-25 DIAGNOSIS — E139 Other specified diabetes mellitus without complications: Secondary | ICD-10-CM | POA: Diagnosis not present

## 2018-02-25 DIAGNOSIS — M79671 Pain in right foot: Secondary | ICD-10-CM | POA: Diagnosis not present

## 2018-03-05 DIAGNOSIS — D692 Other nonthrombocytopenic purpura: Secondary | ICD-10-CM | POA: Diagnosis not present

## 2018-03-05 DIAGNOSIS — C44619 Basal cell carcinoma of skin of left upper limb, including shoulder: Secondary | ICD-10-CM | POA: Diagnosis not present

## 2018-03-05 DIAGNOSIS — L2089 Other atopic dermatitis: Secondary | ICD-10-CM | POA: Diagnosis not present

## 2018-03-05 DIAGNOSIS — C44519 Basal cell carcinoma of skin of other part of trunk: Secondary | ICD-10-CM | POA: Diagnosis not present

## 2018-03-05 DIAGNOSIS — D1801 Hemangioma of skin and subcutaneous tissue: Secondary | ICD-10-CM | POA: Diagnosis not present

## 2018-03-05 DIAGNOSIS — C44311 Basal cell carcinoma of skin of nose: Secondary | ICD-10-CM | POA: Diagnosis not present

## 2018-03-05 DIAGNOSIS — D485 Neoplasm of uncertain behavior of skin: Secondary | ICD-10-CM | POA: Diagnosis not present

## 2018-03-07 DIAGNOSIS — Z23 Encounter for immunization: Secondary | ICD-10-CM | POA: Diagnosis not present

## 2018-03-25 NOTE — Progress Notes (Signed)
HPI: FU CAD. Admitted 11/16 with NSTEMI; cath revealed 40 RCA, 90 D1, 90 mild LAD, 95 distal LAD, 90 OM 2; had PCI of mid LAD with DES. Echo 11/16 showed EF 93-81, grade 2 diastolic dysfunction, mild MR, mild LAE and RVE. Since last seen 7/17, the patient has dyspnea with more extreme activities but not with routine activities. It is relieved with rest. It is not associated with chest pain. There is no orthopnea, PND or pedal edema. There is no syncope or palpitations. There is no exertional chest pain.   Current Outpatient Medications  Medication Sig Dispense Refill  . aspirin EC 325 MG tablet TAKE 1 TABLET EVERY DAY 30 tablet 0  . Cholecalciferol 1000 UNITS capsule Take 1,000 Units by mouth daily.    . Coenzyme Q10 (CO Q 10 PO) Take 1 tablet by mouth daily.    Marland Kitchen doxazosin (CARDURA) 8 MG tablet Take 8 mg by mouth at bedtime.     . ferrous sulfate 325 (65 FE) MG tablet Take 1 tablet (325 mg total) by mouth daily with breakfast.  0  . glimepiride (AMARYL) 4 MG tablet Take 4 mg by mouth daily before breakfast.     . lisinopril (PRINIVIL,ZESTRIL) 2.5 MG tablet Take 1 tablet (2.5 mg total) by mouth daily. 30 tablet 11  . metFORMIN (GLUCOPHAGE) 500 MG tablet Take 1,000 mg by mouth 2 (two) times daily with a meal.    . metoprolol tartrate (LOPRESSOR) 25 MG tablet Take 0.5 tablets (12.5 mg total) by mouth 2 (two) times daily. 180 tablet 3  . nitroGLYCERIN (NITROSTAT) 0.4 MG SL tablet Place 1 tablet (0.4 mg total) under the tongue every 5 (five) minutes as needed for chest pain. 25 tablet 3  . omeprazole (PRILOSEC) 20 MG capsule Take 20 mg by mouth daily as needed. heartburn    . rosuvastatin (CRESTOR) 10 MG tablet Take 0.5 tablets (5 mg total) by mouth daily. 90 tablet 3  . zolpidem (AMBIEN) 5 MG tablet Take 5 mg by mouth at bedtime as needed. sleep     No current facility-administered medications for this visit.      Past Medical History:  Diagnosis Date  . Allergic rhinitis   . BPH  (benign prostatic hyperplasia)    With obstruction/lower urinary tract symptoms. Takes cardura for prostate.   Marland Kitchen CAD (coronary artery disease)    a. 05/02/2015: DES to mid LAD  . Diabetes mellitus   . Essential hypertension 06/15/2015  . GERD (gastroesophageal reflux disease)   . History of urinary calculi   . Incidental pulmonary nodule   . Kidney stone   . Mixed hyperlipidemia   . Skin cancer     Past Surgical History:  Procedure Laterality Date  . APPENDECTOMY    . BACK SURGERY    . CARDIAC CATHETERIZATION N/A 05/02/2015   Procedure: Left Heart Cath and Coronary Angiography;  Surgeon: Lorretta Harp, MD;  Location: [facility];  Service: Cardiovascular;  Laterality: N/A;  . CARDIAC CATHETERIZATION N/A 05/02/2015   Procedure: Coronary Stent Intervention;  Surgeon: Lorretta Harp, MD;  Location: [facility];  Service: Cardiovascular;  Laterality: N/A;  . MOHS SURGERY    . TOTAL SHOULDER REPLACEMENT      Social History   Socioeconomic History  . Marital status: Married    Spouse name: Not on file  . Number of children: 1  . Years of education: Not on file  . Highest education level:  Not on file  Occupational History  . Occupation: Retired  Scientific laboratory technician  . Financial resource strain: Not on file  . Food insecurity:    Worry: Not on file    Inability: Not on file  . Transportation needs:    Medical: Not on file    Non-medical: Not on file  Tobacco Use  . Smoking status: Never Smoker  . Smokeless tobacco: Never Used  Substance and Sexual Activity  . Alcohol use: No  . Drug use: No  . Sexual activity: Not on file  Lifestyle  . Physical activity:    Days per week: Not on file    Minutes per session: Not on file  . Stress: Not on file  Relationships  . Social connections:    Talks on phone: Not on file    Gets together: Not on file    Attends religious service: Not on file    Active member of club or organization: Not on file    Attends meetings  of clubs or organizations: Not on file    Relationship status: Not on file  . Intimate partner violence:    Fear of current or ex partner: Not on file    Emotionally abused: Not on file    Physically abused: Not on file    Forced sexual activity: Not on file  Other Topics Concern  . Not on file  Social History Narrative   Lives in [place], [place] with wife.    Family History  Problem Relation Age of Onset  . Heart attack Father   . CAD Father   . Coronary artery disease Brother        died in his 34s after a fall  . Coronary artery disease Brother        dx mid-60s  . Colitis Sister     ROS: no fevers or chills, productive cough, hemoptysis, dysphasia, odynophagia, melena, hematochezia, dysuria, hematuria, rash, seizure activity, orthopnea, PND, pedal edema, claudication. Remaining systems are negative.  Physical Exam: Well-developed well-nourished in no acute distress.  Skin is warm and dry.  HEENT is normal.  Neck is supple.  Chest is clear to auscultation with normal expansion.  Cardiovascular exam is regular rate and rhythm.  Abdominal exam nontender or distended. No masses palpated. Extremities show no edema. neuro grossly intact  ECG-sinus rhythm at a rate of 59.  Low voltage.  Cannot rule out prior inferior infarct.  Right bundle branch block.  Personally reviewed  A/P  1 coronary artery disease-patient doing well with no chest pain.  Continue ASA (decrease to 81 mg daily) and statin.  2 hypertension-blood pressure is controlled.  Continue present medications.  Potassium and renal function monitored by primary care.  3 hyperlipidemia-we will continue with present dose of Crestor.  He did not tolerate higher doses previously.  Lipids and liver monitored by primary care.  Kirk Ruths, MD

## 2018-04-02 ENCOUNTER — Ambulatory Visit (INDEPENDENT_AMBULATORY_CARE_PROVIDER_SITE_OTHER): Payer: Medicare HMO | Admitting: Cardiology

## 2018-04-02 ENCOUNTER — Encounter: Payer: Self-pay | Admitting: Cardiology

## 2018-04-02 VITALS — BP 106/60 | HR 59 | Ht 65.5 in | Wt 163.8 lb

## 2018-04-02 DIAGNOSIS — I10 Essential (primary) hypertension: Secondary | ICD-10-CM | POA: Diagnosis not present

## 2018-04-02 DIAGNOSIS — E7800 Pure hypercholesterolemia, unspecified: Secondary | ICD-10-CM

## 2018-04-02 DIAGNOSIS — I2510 Atherosclerotic heart disease of native coronary artery without angina pectoris: Secondary | ICD-10-CM | POA: Diagnosis not present

## 2018-04-02 MED ORDER — ASPIRIN EC 81 MG PO TBEC: 325.0000 mg | DELAYED_RELEASE_TABLET | Freq: Every day | ORAL | Status: AC

## 2018-04-02 NOTE — Patient Instructions (Signed)
Medication Instructions:   Decrease aspirin to 81 mg once daily  Follow-Up:  Your physician recommends that you schedule a follow-up appointment in: one year with dr crenshaw=please give the office a call 2 months prior to appointment ime to schedule

## 2018-05-15 ENCOUNTER — Encounter: Payer: Self-pay | Admitting: Cardiology

## 2018-05-15 DIAGNOSIS — N401 Enlarged prostate with lower urinary tract symptoms: Secondary | ICD-10-CM | POA: Diagnosis not present

## 2018-05-15 DIAGNOSIS — E782 Mixed hyperlipidemia: Secondary | ICD-10-CM | POA: Diagnosis not present

## 2018-05-15 DIAGNOSIS — E119 Type 2 diabetes mellitus without complications: Secondary | ICD-10-CM | POA: Diagnosis not present

## 2018-05-15 DIAGNOSIS — F5101 Primary insomnia: Secondary | ICD-10-CM | POA: Diagnosis not present

## 2018-05-15 DIAGNOSIS — I10 Essential (primary) hypertension: Secondary | ICD-10-CM | POA: Diagnosis not present

## 2018-05-15 DIAGNOSIS — D509 Iron deficiency anemia, unspecified: Secondary | ICD-10-CM | POA: Diagnosis not present

## 2018-05-15 DIAGNOSIS — M25511 Pain in right shoulder: Secondary | ICD-10-CM | POA: Diagnosis not present

## 2018-05-26 DIAGNOSIS — C44311 Basal cell carcinoma of skin of nose: Secondary | ICD-10-CM | POA: Diagnosis not present

## 2018-08-25 DIAGNOSIS — E119 Type 2 diabetes mellitus without complications: Secondary | ICD-10-CM | POA: Diagnosis not present

## 2018-11-14 DIAGNOSIS — Z7984 Long term (current) use of oral hypoglycemic drugs: Secondary | ICD-10-CM | POA: Diagnosis not present

## 2018-11-14 DIAGNOSIS — N401 Enlarged prostate with lower urinary tract symptoms: Secondary | ICD-10-CM | POA: Diagnosis not present

## 2018-11-14 DIAGNOSIS — F5101 Primary insomnia: Secondary | ICD-10-CM | POA: Diagnosis not present

## 2018-11-14 DIAGNOSIS — I10 Essential (primary) hypertension: Secondary | ICD-10-CM | POA: Diagnosis not present

## 2018-11-14 DIAGNOSIS — E119 Type 2 diabetes mellitus without complications: Secondary | ICD-10-CM | POA: Diagnosis not present

## 2018-11-14 DIAGNOSIS — D509 Iron deficiency anemia, unspecified: Secondary | ICD-10-CM | POA: Diagnosis not present

## 2018-11-14 DIAGNOSIS — E782 Mixed hyperlipidemia: Secondary | ICD-10-CM | POA: Diagnosis not present

## 2018-11-17 DIAGNOSIS — Z08 Encounter for follow-up examination after completed treatment for malignant neoplasm: Secondary | ICD-10-CM | POA: Diagnosis not present

## 2018-11-17 DIAGNOSIS — C44319 Basal cell carcinoma of skin of other parts of face: Secondary | ICD-10-CM | POA: Diagnosis not present

## 2018-11-17 DIAGNOSIS — C44212 Basal cell carcinoma of skin of right ear and external auricular canal: Secondary | ICD-10-CM | POA: Diagnosis not present

## 2018-11-17 DIAGNOSIS — Z85828 Personal history of other malignant neoplasm of skin: Secondary | ICD-10-CM | POA: Diagnosis not present

## 2018-11-17 DIAGNOSIS — L570 Actinic keratosis: Secondary | ICD-10-CM | POA: Diagnosis not present

## 2018-11-17 DIAGNOSIS — D1801 Hemangioma of skin and subcutaneous tissue: Secondary | ICD-10-CM | POA: Diagnosis not present

## 2018-11-17 DIAGNOSIS — C44292 Other specified malignant neoplasm of skin of right ear and external auricular canal: Secondary | ICD-10-CM | POA: Diagnosis not present

## 2018-12-15 DIAGNOSIS — C44292 Other specified malignant neoplasm of skin of right ear and external auricular canal: Secondary | ICD-10-CM | POA: Diagnosis not present

## 2019-01-02 DIAGNOSIS — H5210 Myopia, unspecified eye: Secondary | ICD-10-CM | POA: Diagnosis not present

## 2019-02-19 DIAGNOSIS — Z23 Encounter for immunization: Secondary | ICD-10-CM | POA: Diagnosis not present

## 2019-02-26 DIAGNOSIS — E119 Type 2 diabetes mellitus without complications: Secondary | ICD-10-CM | POA: Diagnosis not present

## 2019-04-21 NOTE — Progress Notes (Signed)
HPI: FU CAD. Admitted 11/16 with NSTEMI; cath revealed 40 RCA, 90 D1, 90 mild LAD, 95 distal LAD, 90 OM 2; had PCI of mid LAD with DES. Echo 11/16 showed EF [id number], grade 2 diastolic dysfunction, mild MR, mild LAE and RVE. Since last seen 7/17, the patient has dyspnea with more extreme activities but not with routine activities. It is relieved with rest. It is not associated with chest pain. There is no orthopnea, PND or pedal edema. There is no syncope or palpitations. There is no exertional chest pain.   Current Outpatient Medications  Medication Sig Dispense Refill  . aspirin EC 81 MG tablet Take 4 tablets (325 mg total) by mouth daily.    . Cholecalciferol 1000 UNITS capsule Take 1,000 Units by mouth daily.    . Coenzyme Q10 (CO Q 10 PO) Take 1 tablet by mouth daily.    Marland Kitchen doxazosin (CARDURA) 8 MG tablet Take 8 mg by mouth at bedtime.     . ferrous sulfate 325 (65 FE) MG tablet Take 1 tablet (325 mg total) by mouth daily with breakfast.  0  . glimepiride (AMARYL) 4 MG tablet Take 4 mg by mouth daily before breakfast.     . lisinopril (PRINIVIL,ZESTRIL) 2.5 MG tablet Take 1 tablet (2.5 mg total) by mouth daily. 30 tablet 11  . metFORMIN (GLUCOPHAGE) 500 MG tablet Take 1,000 mg by mouth 2 (two) times daily with a meal.    . metoprolol tartrate (LOPRESSOR) 25 MG tablet Take 0.5 tablets (12.5 mg total) by mouth 2 (two) times daily. 180 tablet 3  . nitroGLYCERIN (NITROSTAT) 0.4 MG SL tablet Place 1 tablet (0.4 mg total) under the tongue every 5 (five) minutes as needed for chest pain. 25 tablet 3  . omeprazole (PRILOSEC) 20 MG capsule Take 20 mg by mouth daily as needed. heartburn    . rosuvastatin (CRESTOR) 10 MG tablet Take 0.5 tablets (5 mg total) by mouth daily. 90 tablet 3  . zolpidem (AMBIEN) 5 MG tablet Take 5 mg by mouth at bedtime as needed. sleep     No current facility-administered medications for this visit.      Past Medical History:  Diagnosis Date  . Allergic rhinitis    . BPH (benign prostatic hyperplasia)    With obstruction/lower urinary tract symptoms. Takes cardura for prostate.   Marland Kitchen CAD (coronary artery disease)    a. 05/02/2015: DES to mid LAD  . Diabetes mellitus   . Essential hypertension 06/15/2015  . GERD (gastroesophageal reflux disease)   . History of urinary calculi   . Incidental pulmonary nodule   . Kidney stone   . Mixed hyperlipidemia   . Skin cancer     Past Surgical History:  Procedure Laterality Date  . APPENDECTOMY    . BACK SURGERY    . CARDIAC CATHETERIZATION N/A 05/02/2015   Procedure: Left Heart Cath and Coronary Angiography;  Surgeon: Lorretta Harp, MD;  Location: [facility];  Service: Cardiovascular;  Laterality: N/A;  . CARDIAC CATHETERIZATION N/A 05/02/2015   Procedure: Coronary Stent Intervention;  Surgeon: Lorretta Harp, MD;  Location: [facility];  Service: Cardiovascular;  Laterality: N/A;  . MOHS SURGERY    . TOTAL SHOULDER REPLACEMENT      Social History   Socioeconomic History  . Marital status: Married    Spouse name: Not on file  . Number of children: 1  . Years of education: Not on file  .  Highest education level: Not on file  Occupational History  . Occupation: Retired  Scientific laboratory technician  . Financial resource strain: Not on file  . Food insecurity    Worry: Not on file    Inability: Not on file  . Transportation needs    Medical: Not on file    Non-medical: Not on file  Tobacco Use  . Smoking status: Never Smoker  . Smokeless tobacco: Never Used  Substance and Sexual Activity  . Alcohol use: No  . Drug use: No  . Sexual activity: Not on file  Lifestyle  . Physical activity    Days per week: Not on file    Minutes per session: Not on file  . Stress: Not on file  Relationships  . Social Herbalist on phone: Not on file    Gets together: Not on file    Attends religious service: Not on file    Active member of club or organization: Not on file    Attends  meetings of clubs or organizations: Not on file    Relationship status: Not on file  . Intimate partner violence    Fear of current or ex partner: Not on file    Emotionally abused: Not on file    Physically abused: Not on file    Forced sexual activity: Not on file  Other Topics Concern  . Not on file  Social History Narrative   Lives in [place], [place] with wife.    Family History  Problem Relation Age of Onset  . Heart attack Father   . CAD Father   . Coronary artery disease Brother        died in his 15s after a fall  . Coronary artery disease Brother        dx mid-60s  . Colitis Sister     ROS: no fevers or chills, productive cough, hemoptysis, dysphasia, odynophagia, melena, hematochezia, dysuria, hematuria, rash, seizure activity, orthopnea, PND, pedal edema, claudication. Remaining systems are negative.  Physical Exam: Well-developed well-nourished in no acute distress.  Skin is warm and dry.  HEENT is normal.  Neck is supple.  Chest is clear to auscultation with normal expansion.  Cardiovascular exam is regular rate and rhythm.  Abdominal exam nontender or distended. No masses palpated. Extremities show no edema. neuro grossly intact  ECG-sinus rhythm with PACs, right bundle branch block, inferior infarct.  Personally reviewed  A/P  1 CAD-no chest pain; continue ASA and statin.  2 hypertension-BP controlled; continue present meds and follow.  Potassium and renal function monitored by primary care.  3 Hyperlipidemia-continue present dose of Crestor.  He did not tolerate high-dose statin previously.  Lipids and liver monitored by primary care.  Kirk Ruths, MD

## 2019-04-29 ENCOUNTER — Ambulatory Visit (INDEPENDENT_AMBULATORY_CARE_PROVIDER_SITE_OTHER): Payer: Medicare HMO | Admitting: Cardiology

## 2019-04-29 ENCOUNTER — Encounter: Payer: Self-pay | Admitting: Cardiology

## 2019-04-29 ENCOUNTER — Other Ambulatory Visit: Payer: Self-pay

## 2019-04-29 VITALS — BP 130/62 | HR 86 | Ht 65.5 in | Wt 162.0 lb

## 2019-04-29 DIAGNOSIS — E7800 Pure hypercholesterolemia, unspecified: Secondary | ICD-10-CM | POA: Diagnosis not present

## 2019-04-29 DIAGNOSIS — I10 Essential (primary) hypertension: Secondary | ICD-10-CM | POA: Diagnosis not present

## 2019-04-29 DIAGNOSIS — I2510 Atherosclerotic heart disease of native coronary artery without angina pectoris: Secondary | ICD-10-CM | POA: Diagnosis not present

## 2019-04-29 NOTE — Patient Instructions (Signed)
Medication Instructions:  NO CHANGE *If you need a refill on your cardiac medications before your next appointment, please call your pharmacy*  Lab Work: If you have labs (blood work) drawn today and your tests are completely normal, you will receive your results only by: Marland Kitchen MyChart Message (if you have MyChart) OR . A paper copy in the mail If you have any lab test that is abnormal or we need to change your treatment, we will call you to review the results.  Follow-Up: At [facility], you and your health needs are our priority.  As part of our continuing mission to provide you with exceptional heart care, we have created designated Provider Care Teams.  These Care Teams include your primary Cardiologist (physician) and Advanced Practice Providers (APPs -  Physician Assistants and Nurse Practitioners) who all work together to provide you with the care you need, when you need it.  Your physician wants you to follow-up in: [place] will receive a reminder letter in the mail two months in advance. If you don't receive a letter, please call our office to schedule the follow-up appointment.

## 2019-05-20 DIAGNOSIS — Z794 Long term (current) use of insulin: Secondary | ICD-10-CM | POA: Diagnosis not present

## 2019-05-20 DIAGNOSIS — I10 Essential (primary) hypertension: Secondary | ICD-10-CM | POA: Diagnosis not present

## 2019-05-20 DIAGNOSIS — E119 Type 2 diabetes mellitus without complications: Secondary | ICD-10-CM | POA: Diagnosis not present

## 2019-05-20 DIAGNOSIS — E782 Mixed hyperlipidemia: Secondary | ICD-10-CM | POA: Diagnosis not present

## 2019-05-20 DIAGNOSIS — F5101 Primary insomnia: Secondary | ICD-10-CM | POA: Diagnosis not present

## 2019-05-20 DIAGNOSIS — N401 Enlarged prostate with lower urinary tract symptoms: Secondary | ICD-10-CM | POA: Diagnosis not present

## 2019-06-24 DIAGNOSIS — L570 Actinic keratosis: Secondary | ICD-10-CM | POA: Diagnosis not present

## 2019-06-24 DIAGNOSIS — C44319 Basal cell carcinoma of skin of other parts of face: Secondary | ICD-10-CM | POA: Diagnosis not present

## 2019-06-24 DIAGNOSIS — C44212 Basal cell carcinoma of skin of right ear and external auricular canal: Secondary | ICD-10-CM | POA: Diagnosis not present

## 2019-06-24 DIAGNOSIS — D1801 Hemangioma of skin and subcutaneous tissue: Secondary | ICD-10-CM | POA: Diagnosis not present

## 2019-11-30 ENCOUNTER — Telehealth: Payer: Self-pay | Admitting: Family Medicine

## 2019-11-30 NOTE — Telephone Encounter (Signed)
Patient is asking to become a new patient of dr . [name] . Patient daughter is a patient of dr blyth . [phone] [name] please advise .

## 2019-12-11 NOTE — Telephone Encounter (Signed)
yes

## 2019-12-11 NOTE — Telephone Encounter (Signed)
Ok per Dr. [name] to take on but explain to them about wait and limited schedule.  They would like to wait.  They are close here to our office and it will be convenient  Him and his wife would like to be scheduled on same day.  Are you ok with that?

## 2019-12-16 NOTE — Telephone Encounter (Signed)
Left message to call back to schedule same day appt.    Please schedule for a day in February a 120 or 2 slot

## 2019-12-29 DIAGNOSIS — H35033 Hypertensive retinopathy, bilateral: Secondary | ICD-10-CM | POA: Diagnosis not present

## 2019-12-29 DIAGNOSIS — H26493 Other secondary cataract, bilateral: Secondary | ICD-10-CM | POA: Diagnosis not present

## 2019-12-29 DIAGNOSIS — E119 Type 2 diabetes mellitus without complications: Secondary | ICD-10-CM | POA: Diagnosis not present

## 2019-12-29 DIAGNOSIS — H524 Presbyopia: Secondary | ICD-10-CM | POA: Diagnosis not present

## 2019-12-30 DIAGNOSIS — C4441 Basal cell carcinoma of skin of scalp and neck: Secondary | ICD-10-CM | POA: Diagnosis not present

## 2019-12-30 DIAGNOSIS — L570 Actinic keratosis: Secondary | ICD-10-CM | POA: Diagnosis not present

## 2019-12-30 DIAGNOSIS — D485 Neoplasm of uncertain behavior of skin: Secondary | ICD-10-CM | POA: Diagnosis not present

## 2019-12-30 DIAGNOSIS — L821 Other seborrheic keratosis: Secondary | ICD-10-CM | POA: Diagnosis not present

## 2019-12-30 DIAGNOSIS — Z85828 Personal history of other malignant neoplasm of skin: Secondary | ICD-10-CM | POA: Diagnosis not present

## 2019-12-30 DIAGNOSIS — D0462 Carcinoma in situ of skin of left upper limb, including shoulder: Secondary | ICD-10-CM | POA: Diagnosis not present

## 2019-12-31 DIAGNOSIS — Z0100 Encounter for examination of eyes and vision without abnormal findings: Secondary | ICD-10-CM | POA: Diagnosis not present

## 2020-01-21 DIAGNOSIS — M25511 Pain in right shoulder: Secondary | ICD-10-CM | POA: Diagnosis not present

## 2020-01-21 DIAGNOSIS — N401 Enlarged prostate with lower urinary tract symptoms: Secondary | ICD-10-CM | POA: Diagnosis not present

## 2020-01-21 DIAGNOSIS — I10 Essential (primary) hypertension: Secondary | ICD-10-CM | POA: Diagnosis not present

## 2020-01-21 DIAGNOSIS — Z0000 Encounter for general adult medical examination without abnormal findings: Secondary | ICD-10-CM | POA: Diagnosis not present

## 2020-01-21 DIAGNOSIS — Z7984 Long term (current) use of oral hypoglycemic drugs: Secondary | ICD-10-CM | POA: Diagnosis not present

## 2020-01-21 DIAGNOSIS — E782 Mixed hyperlipidemia: Secondary | ICD-10-CM | POA: Diagnosis not present

## 2020-01-21 DIAGNOSIS — G8929 Other chronic pain: Secondary | ICD-10-CM | POA: Diagnosis not present

## 2020-01-21 DIAGNOSIS — E119 Type 2 diabetes mellitus without complications: Secondary | ICD-10-CM | POA: Diagnosis not present

## 2020-01-21 DIAGNOSIS — F5101 Primary insomnia: Secondary | ICD-10-CM | POA: Diagnosis not present

## 2020-02-01 DIAGNOSIS — C4441 Basal cell carcinoma of skin of scalp and neck: Secondary | ICD-10-CM | POA: Diagnosis not present

## 2020-04-05 DIAGNOSIS — D509 Iron deficiency anemia, unspecified: Secondary | ICD-10-CM | POA: Diagnosis not present

## 2020-04-05 DIAGNOSIS — N401 Enlarged prostate with lower urinary tract symptoms: Secondary | ICD-10-CM | POA: Diagnosis not present

## 2020-04-05 DIAGNOSIS — E782 Mixed hyperlipidemia: Secondary | ICD-10-CM | POA: Diagnosis not present

## 2020-04-05 DIAGNOSIS — E119 Type 2 diabetes mellitus without complications: Secondary | ICD-10-CM | POA: Diagnosis not present

## 2020-04-05 DIAGNOSIS — I10 Essential (primary) hypertension: Secondary | ICD-10-CM | POA: Diagnosis not present

## 2020-06-07 NOTE — Progress Notes (Signed)
HPI: FU CAD. Admitted 11/16 with NSTEMI; cath revealed 40 RCA, 90 D1, 90 mild LAD, 95 distal LAD, 90 OM 2; had PCI of mid LAD with DES. Echo 11/16 showed EF [id number], grade 2 diastolic dysfunction, mild MR, mild LAE and RVE. Since last seenpatient denies dyspnea, chest pain, palpitations or syncope.  Current Outpatient Medications  Medication Sig Dispense Refill  . aspirin EC 81 MG tablet Take 4 tablets (325 mg total) by mouth daily.    . Cholecalciferol 1000 UNITS capsule Take 1,000 Units by mouth daily.    . Coenzyme Q10 (CO Q 10 PO) Take 1 tablet by mouth daily.    Marland Kitchen doxazosin (CARDURA) 8 MG tablet Take 8 mg by mouth at bedtime.    . ferrous sulfate 325 (65 FE) MG tablet Take 1 tablet (325 mg total) by mouth daily with breakfast.  0  . glimepiride (AMARYL) 4 MG tablet Take 4 mg by mouth daily before breakfast.    . lisinopril (PRINIVIL,ZESTRIL) 2.5 MG tablet Take 1 tablet (2.5 mg total) by mouth daily. 30 tablet 11  . metFORMIN (GLUCOPHAGE) 500 MG tablet Take 1,000 mg by mouth 2 (two) times daily with a meal.    . metoprolol tartrate (LOPRESSOR) 25 MG tablet Take 0.5 tablets (12.5 mg total) by mouth 2 (two) times daily. 180 tablet 3  . nitroGLYCERIN (NITROSTAT) 0.4 MG SL tablet Place 1 tablet (0.4 mg total) under the tongue every 5 (five) minutes as needed for chest pain. 25 tablet 3  . omeprazole (PRILOSEC) 20 MG capsule Take 20 mg by mouth daily as needed. heartburn    . rosuvastatin (CRESTOR) 10 MG tablet Take 0.5 tablets (5 mg total) by mouth daily. 90 tablet 3  . zolpidem (AMBIEN) 5 MG tablet Take 5 mg by mouth at bedtime as needed. sleep     No current facility-administered medications for this visit.     Past Medical History:  Diagnosis Date  . Allergic rhinitis   . BPH (benign prostatic hyperplasia)    With obstruction/lower urinary tract symptoms. Takes cardura for prostate.   Marland Kitchen CAD (coronary artery disease)    a. 05/02/2015: DES to mid LAD  . Diabetes mellitus   .  Essential hypertension 06/15/2015  . GERD (gastroesophageal reflux disease)   . History of urinary calculi   . Incidental pulmonary nodule   . Kidney stone   . Mixed hyperlipidemia   . Skin cancer     Past Surgical History:  Procedure Laterality Date  . APPENDECTOMY    . BACK SURGERY    . CARDIAC CATHETERIZATION N/A 05/02/2015   Procedure: Left Heart Cath and Coronary Angiography;  Surgeon: Lorretta Harp, MD;  Location: [facility];  Service: Cardiovascular;  Laterality: N/A;  . CARDIAC CATHETERIZATION N/A 05/02/2015   Procedure: Coronary Stent Intervention;  Surgeon: Lorretta Harp, MD;  Location: [facility];  Service: Cardiovascular;  Laterality: N/A;  . MOHS SURGERY    . TOTAL SHOULDER REPLACEMENT      Social History   Socioeconomic History  . Marital status: Married    Spouse name: Not on file  . Number of children: 1  . Years of education: Not on file  . Highest education level: Not on file  Occupational History  . Occupation: Retired  Tobacco Use  . Smoking status: Never Smoker  . Smokeless tobacco: Never Used  Substance and Sexual Activity  . Alcohol use: No  . Drug use: No  .  Sexual activity: Not on file  Other Topics Concern  . Not on file  Social History Narrative   Lives in [place], [place] with wife.   Social Determinants of Health   Financial Resource Strain: Not on file  Food Insecurity: Not on file  Transportation Needs: Not on file  Physical Activity: Not on file  Stress: Not on file  Social Connections: Not on file  Intimate Partner Violence: Not on file    Family History  Problem Relation Age of Onset  . Heart attack Father   . CAD Father   . Coronary artery disease Brother        died in his 74s after a fall  . Coronary artery disease Brother        dx mid-60s  . Colitis Sister     ROS: Some leg weakness but no fevers or chills, productive cough, hemoptysis, dysphasia, odynophagia, melena, hematochezia, dysuria,  hematuria, rash, seizure activity, orthopnea, PND, pedal edema, claudication. Remaining systems are negative.  Physical Exam: Well-developed well-nourished in no acute distress.  Skin is warm and dry.  HEENT is normal.  Neck is supple.  Chest is clear to auscultation with normal expansion.  Cardiovascular exam is regular rate and rhythm.  Abdominal exam nontender or distended. No masses palpated. Extremities show no edema.  2+ posterior tibial pulses bilaterally. neuro grossly intact  ECG-normal sinus rhythm, right bundle branch block, cannot rule out prior inferior infarct.  Personally reviewed  A/P  1 coronary artery disease-patient denies chest pain.  Continue aspirin and statin.  Repeat echocardiogram to reassess LV function.  Mildly reduced previously.  2 hypertension-patient's blood pressure is controlled.  Continue present medical regimen and follow.  Renal function monitored by primary care.  3 hyperlipidemia-continue Crestor.  Note he did not tolerate high-dose statins previously.  Lipids and liver monitored by primary care.  Olga Millers, MD

## 2020-06-15 ENCOUNTER — Ambulatory Visit: Payer: Medicare HMO | Admitting: Cardiology

## 2020-06-15 ENCOUNTER — Encounter: Payer: Self-pay | Admitting: Cardiology

## 2020-06-15 ENCOUNTER — Other Ambulatory Visit: Payer: Self-pay

## 2020-06-15 VITALS — BP 118/62 | HR 65 | Ht 66.0 in | Wt 159.4 lb

## 2020-06-15 DIAGNOSIS — I10 Essential (primary) hypertension: Secondary | ICD-10-CM

## 2020-06-15 DIAGNOSIS — I2510 Atherosclerotic heart disease of native coronary artery without angina pectoris: Secondary | ICD-10-CM

## 2020-06-15 DIAGNOSIS — E7800 Pure hypercholesterolemia, unspecified: Secondary | ICD-10-CM

## 2020-06-15 NOTE — Patient Instructions (Signed)

## 2020-06-20 ENCOUNTER — Ambulatory Visit: Payer: Medicare HMO | Admitting: Family Medicine

## 2020-06-23 ENCOUNTER — Other Ambulatory Visit: Payer: Self-pay

## 2020-06-24 ENCOUNTER — Encounter: Payer: Self-pay | Admitting: Family Medicine

## 2020-06-24 ENCOUNTER — Other Ambulatory Visit: Payer: Self-pay | Admitting: Family Medicine

## 2020-06-24 ENCOUNTER — Ambulatory Visit (INDEPENDENT_AMBULATORY_CARE_PROVIDER_SITE_OTHER): Payer: Medicare HMO | Admitting: Family Medicine

## 2020-06-24 VITALS — BP 110/66 | HR 56 | Temp 98.0°F | Ht 65.0 in | Wt 159.0 lb

## 2020-06-24 DIAGNOSIS — I214 Non-ST elevation (NSTEMI) myocardial infarction: Secondary | ICD-10-CM | POA: Diagnosis not present

## 2020-06-24 DIAGNOSIS — R0600 Dyspnea, unspecified: Secondary | ICD-10-CM

## 2020-06-24 DIAGNOSIS — E785 Hyperlipidemia, unspecified: Secondary | ICD-10-CM

## 2020-06-24 DIAGNOSIS — I2510 Atherosclerotic heart disease of native coronary artery without angina pectoris: Secondary | ICD-10-CM

## 2020-06-24 DIAGNOSIS — I10 Essential (primary) hypertension: Secondary | ICD-10-CM | POA: Diagnosis not present

## 2020-06-24 DIAGNOSIS — N289 Disorder of kidney and ureter, unspecified: Secondary | ICD-10-CM | POA: Diagnosis not present

## 2020-06-24 DIAGNOSIS — C4491 Basal cell carcinoma of skin, unspecified: Secondary | ICD-10-CM

## 2020-06-24 DIAGNOSIS — D649 Anemia, unspecified: Secondary | ICD-10-CM

## 2020-06-24 DIAGNOSIS — M25511 Pain in right shoulder: Secondary | ICD-10-CM

## 2020-06-24 DIAGNOSIS — E119 Type 2 diabetes mellitus without complications: Secondary | ICD-10-CM | POA: Diagnosis not present

## 2020-06-24 LAB — CBC
HCT: 33.3 % — ABNORMAL LOW (ref 39.0–52.0)
Hemoglobin: 11.1 g/dL — ABNORMAL LOW (ref 13.0–17.0)
MCHC: 33.3 g/dL (ref 30.0–36.0)
MCV: 95.2 fl (ref 78.0–100.0)
Platelets: 202 10*3/uL (ref 150.0–400.0)
RBC: 3.5 Mil/uL — ABNORMAL LOW (ref 4.22–5.81)
RDW: 14 % (ref 11.5–15.5)
WBC: 9.6 10*3/uL (ref 4.0–10.5)

## 2020-06-24 LAB — LIPID PANEL
Cholesterol: 118 mg/dL (ref 0–200)
HDL: 41.4 mg/dL (ref >39.00)
LDL Cholesterol: 58 mg/dL (ref 0–99)
NonHDL: 76.75
Total CHOL/HDL Ratio: 3
Triglycerides: 96 mg/dL (ref 0.0–149.0)
VLDL: 19.2 mg/dL (ref 0.0–40.0)

## 2020-06-24 LAB — COMPREHENSIVE METABOLIC PANEL
ALT: 13 U/L (ref 0–53)
AST: 15 U/L (ref 0–37)
Albumin: 4.2 g/dL (ref 3.5–5.2)
Alkaline Phosphatase: 63 U/L (ref 39–117)
BUN: 25 mg/dL — ABNORMAL HIGH (ref 6–23)
CO2: 27 mEq/L (ref 19–32)
Calcium: 9.5 mg/dL (ref 8.4–10.5)
Chloride: 105 mEq/L (ref 96–112)
Creatinine, Ser: 1.5 mg/dL (ref 0.40–1.50)
GFR: 42.2 mL/min — ABNORMAL LOW (ref >60.00)
Glucose, Bld: 153 mg/dL — ABNORMAL HIGH (ref 70–99)
Potassium: 5 mEq/L (ref 3.5–5.1)
Sodium: 142 mEq/L (ref 135–145)
Total Bilirubin: 0.4 mg/dL (ref 0.2–1.2)
Total Protein: 6.6 g/dL (ref 6.0–8.3)

## 2020-06-24 LAB — TSH: TSH: 4.14 u[IU]/mL (ref 0.35–4.50)

## 2020-06-24 LAB — HEMOGLOBIN A1C: Hgb A1c MFr Bld: 7.7 % — ABNORMAL HIGH (ref 4.6–6.5)

## 2020-06-24 MED ORDER — ROSUVASTATIN CALCIUM 5 MG PO TABS: 5.0000 mg | ORAL_TABLET | Freq: Every day | ORAL | 1 refills | Status: DC

## 2020-06-24 MED ORDER — ZOLPIDEM TARTRATE 10 MG PO TABS: 10.0000 mg | ORAL_TABLET | Freq: Every evening | ORAL | 1 refills | Status: DC | PRN

## 2020-06-24 MED ORDER — NITROGLYCERIN 0.4 MG SL SUBL: 0.4000 mg | SUBLINGUAL_TABLET | SUBLINGUAL | 3 refills | Status: DC | PRN

## 2020-06-24 MED ORDER — TRAMADOL HCL 50 MG PO TABS: 50.0000 mg | ORAL_TABLET | Freq: Every day | ORAL | 0 refills | Status: AC | PRN

## 2020-06-24 MED ORDER — FERROUS SULFATE 325 (65 FE) MG PO TABS: 325.0000 mg | ORAL_TABLET | ORAL | 0 refills | Status: DC

## 2020-06-24 MED FILL — NITROGLYCERIN 0.4 MG TAB SL: 0.4 | 5 days supply | Qty: 25 | Fill #0

## 2020-06-24 NOTE — Patient Instructions (Signed)

## 2020-06-26 DIAGNOSIS — N289 Disorder of kidney and ureter, unspecified: Secondary | ICD-10-CM | POA: Insufficient documentation

## 2020-06-26 DIAGNOSIS — C4491 Basal cell carcinoma of skin, unspecified: Secondary | ICD-10-CM | POA: Insufficient documentation

## 2020-06-26 DIAGNOSIS — M25511 Pain in right shoulder: Secondary | ICD-10-CM | POA: Insufficient documentation

## 2020-06-26 NOTE — Assessment & Plan Note (Signed)
Well controlled, no changes to meds. Encouraged heart healthy diet such as the DASH diet and exercise as tolerated.  °

## 2020-06-26 NOTE — Assessment & Plan Note (Signed)
Requesting old records from previous PMD at [facility]. hgba1c 7.7 today. hgba1c acceptable, minimize simple carbs. Increase exercise as tolerated. Continue current meds

## 2020-06-26 NOTE — Assessment & Plan Note (Signed)
Increase leafy greens, consider increased lean red meat and using cast iron cookware. Continue to monitor, report any concerns 

## 2020-06-26 NOTE — Assessment & Plan Note (Signed)
Encouraged heart healthy diet, increase exercise, avoid trans fats, consider a krill oil cap daily 

## 2020-06-26 NOTE — Assessment & Plan Note (Signed)
Has been stable since his MI several years ago. Continue to follow up with cardiology

## 2020-06-26 NOTE — Assessment & Plan Note (Signed)
Improve hydration and monitor. Avoid OTC medications and supplements

## 2020-06-26 NOTE — Assessment & Plan Note (Signed)
Has had numerous surgeries and at this point is resigned to his daily pain and debility.

## 2020-06-26 NOTE — Assessment & Plan Note (Signed)
Follows with Dr Dimple Nanas and has had several lesion.

## 2020-06-26 NOTE — Progress Notes (Signed)
Subjective:    Patient ID: Hayden Garza, male    DOB: Apr 08, 1933, 87 y.o.   MRN: [id number]  Chief Complaint  Patient presents with  . Establish Care  . blood work    HPI Patient is in today for new patient appointment accompanied by his wife. He feels well today but has a complex past medical history including coronary artery disease, anemia, right shoulder pain, diabetes, hyperlipidemia and more. His biggest concern is his chronic shoulder pain, despite several surgeries his shoulder bothers him daily. Denies CP/palp/SOB/HA/congestion/fevers/GI or GU c/o. Taking meds as prescribed  Past Medical History:  Diagnosis Date  . Allergic rhinitis   . Anemia   . BPH (benign prostatic hyperplasia)    With obstruction/lower urinary tract symptoms. Takes cardura for prostate.   Marland Kitchen CAD (coronary artery disease)    a. 05/02/2015: DES to mid LAD  . Diabetes mellitus    type 2  . Essential hypertension 06/15/2015  . GERD (gastroesophageal reflux disease)    controled on Prilosec  . History of urinary calculi   . Incidental pulmonary nodule   . Kidney stone   . Mixed hyperlipidemia   . 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 APPENDECTOMY    . BACK SURGERY     vertebral collapse in lower back had insturment, reports no metal  . CARDIAC CATHETERIZATION N/A 05/02/2015   Procedure: Left Heart Cath and Coronary Angiography;  Surgeon: Runell Gess, MD;  Location: [facility] INVASIVE CV LAB;  Service: Cardiovascular;  Laterality: N/A;  . CARDIAC CATHETERIZATION N/A 05/02/2015   Procedure: Coronary Stent Intervention;  Surgeon: Runell Gess, MD;  Location: [facility] INVASIVE CV LAB;  Service: Cardiovascular;  Laterality: N/A;  . CORONARY ANGIOPLASTY WITH STENT PLACEMENT    . JOINT REPLACEMENT Right    6 surgeries including total shoulder replacement roughly in 2000  . MOHS SURGERY    . TOTAL SHOULDER REPLACEMENT      Family History   Problem Relation Age of Onset  . Heart disease Mother        chf  . Heart attack Father   . CAD Father   . Colitis Sister   . Other Brother        head trauma  . Heart disease Brother   . Hyperlipidemia Brother   . Hypertension Brother   . Diabetes Brother   . Kidney failure Brother   . Diabetes Son     Social History   Socioeconomic History  . Marital status: Married    Spouse name: Not on file  . Number of children: 1  . Years of education: Not on file  . Highest education level: Not on file  Occupational History  . Occupation: Retired  Tobacco Use  . Smoking status: Never Smoker  . Smokeless tobacco: Never Used  Substance and Sexual Activity  . Alcohol use: No  . Drug use: No  . Sexual activity: Not on file  Other Topics Concern  . Not on file  Social History Narrative   Lives in [place], [place] with wife. No pets. No dietary restrictions      Retired from truck driving, long haul   No cigarettes, alcohol to excess   Seat belts every time.    Social Determinants of Health   Financial Resource Strain: Not on file  Food Insecurity: Not on file  Transportation Needs: Not on file  Physical Activity: Not on file  Stress: Not on file  Social Connections: Not on file  Intimate Partner Violence: Not on file    Outpatient Medications Prior to Visit  Medication Sig Dispense Refill  . aspirin EC 81 MG tablet Take 4 tablets (325 mg total) by mouth daily.    . Cholecalciferol 1000 UNITS capsule Take 1,000 Units by mouth daily.    . Coenzyme Q10 (CO Q 10 PO) Take 1 tablet by mouth daily.    Marland Kitchen doxazosin (CARDURA) 8 MG tablet Take 8 mg by mouth at bedtime.    Marland Kitchen glimepiride (AMARYL) 4 MG tablet Take 4 mg by mouth daily before breakfast.    . lisinopril (PRINIVIL,ZESTRIL) 2.5 MG tablet Take 1 tablet (2.5 mg total) by mouth daily. 30 tablet 11  . metFORMIN (GLUCOPHAGE) 500 MG tablet Take 1,000 mg by mouth 2 (two) times daily with a meal.    . metoprolol tartrate  (LOPRESSOR) 25 MG tablet Take 0.5 tablets (12.5 mg total) by mouth 2 (two) times daily. 180 tablet 3  . omeprazole (PRILOSEC) 20 MG capsule Take 20 mg by mouth daily as needed. heartburn    . pioglitazone (ACTOS) 15 MG tablet Take 15 mg by mouth daily.    . ferrous sulfate 325 (65 FE) MG tablet Take 1 tablet (325 mg total) by mouth daily with breakfast.  0  . nitroGLYCERIN (NITROSTAT) 0.4 MG SL tablet Place 1 tablet (0.4 mg total) under the tongue every 5 (five) minutes as needed for chest pain. 25 tablet 3  . rosuvastatin (CRESTOR) 10 MG tablet Take 0.5 tablets (5 mg total) by mouth daily. 90 tablet 3  . zolpidem (AMBIEN) 5 MG tablet Take 10 mg by mouth at bedtime as needed. sleep     No facility-administered medications prior to visit.    Allergies  Allergen Reactions  . Lipitor [Atorvastatin]     Joint and muscle pain  . Penicillin G Other (See Comments)    Joint swelling  . Penicillins     REACTION: swelling in joints    Review of Systems  Constitutional: Negative for chills, fever and malaise/fatigue.  HENT: Negative for congestion and hearing loss.   Eyes: Negative for discharge.  Respiratory: Negative for cough, sputum production and shortness of breath.   Cardiovascular: Negative for chest pain, palpitations and leg swelling.  Gastrointestinal: Negative for abdominal pain, blood in stool, constipation, diarrhea, heartburn, nausea and vomiting.  Genitourinary: Negative for dysuria, frequency, hematuria and urgency.  Musculoskeletal: Positive for joint pain. Negative for back pain, falls and myalgias.  Skin: Negative for rash.  Neurological: Negative for dizziness, sensory change, loss of consciousness, weakness and headaches.  Endo/Heme/Allergies: Negative for environmental allergies. Does not bruise/bleed easily.  Psychiatric/Behavioral: Negative for depression and suicidal ideas. The patient is not nervous/anxious and does not have insomnia.        Objective:     Physical Exam Vitals and nursing note reviewed.  Constitutional:      General: He is not in acute distress.    Appearance: Normal appearance. He is well-developed and well-nourished. He is not ill-appearing.  HENT:     Head: Normocephalic and atraumatic.     Right Ear: Tympanic membrane, ear canal and external ear normal. There is no impacted cerumen.     Left Ear: Tympanic membrane, ear canal and external ear normal. There is no impacted cerumen.     Nose: Nose normal.  Eyes:     General:  Right eye: No discharge.        Left eye: No discharge.     Extraocular Movements: Extraocular movements intact.     Pupils: Pupils are equal, round, and reactive to light.  Cardiovascular:     Rate and Rhythm: Normal rate and regular rhythm.     Pulses: Normal pulses.     Heart sounds: Normal heart sounds. No murmur heard.   Pulmonary:     Effort: Pulmonary effort is normal.     Breath sounds: Normal breath sounds.  Abdominal:     General: Bowel sounds are normal.     Palpations: Abdomen is soft. There is no mass.     Tenderness: There is no abdominal tenderness. There is no guarding.  Musculoskeletal:        General: No tenderness or edema.     Cervical back: Normal range of motion and neck supple.     Right lower leg: No edema.     Left lower leg: No edema.  Skin:    General: Skin is warm and dry.  Neurological:     Mental Status: He is alert and oriented to person, place, and time.  Psychiatric:        Mood and Affect: Mood and affect normal.        Behavior: Behavior normal.     BP 110/66   Pulse (!) 56   Temp 98 F (36.7 C) (Temporal)   Ht 5\' 5"  (1.651 m)   Wt 159 lb (72.1 kg)   SpO2 99%   BMI 26.46 kg/m  Wt Readings from Last 3 Encounters:  06/24/20 159 lb (72.1 kg)  06/15/20 159 lb 6.4 oz (72.3 kg)  04/29/19 162 lb (73.5 kg)    Diabetic Foot Exam - Simple   No data filed    Lab Results  Component Value Date   WBC 9.6 06/24/2020   HGB 11.1 (L)  06/24/2020   HCT 33.3 (L) 06/24/2020   PLT 202.0 06/24/2020   GLUCOSE 153 (H) 06/24/2020   CHOL 118 06/24/2020   TRIG 96.0 06/24/2020   HDL 41.40 06/24/2020   LDLCALC 58 06/24/2020   ALT 13 06/24/2020   AST 15 06/24/2020   NA 142 06/24/2020   K 5.0 06/24/2020   CL 105 06/24/2020   CREATININE 1.50 06/24/2020   BUN 25 (H) 06/24/2020   CO2 27 06/24/2020   TSH 4.14 06/24/2020   INR 1.13 05/01/2015   HGBA1C 7.7 (H) 06/24/2020    Lab Results  Component Value Date   TSH 4.14 06/24/2020   Lab Results  Component Value Date   WBC 9.6 06/24/2020   HGB 11.1 (L) 06/24/2020   HCT 33.3 (L) 06/24/2020   MCV 95.2 06/24/2020   PLT 202.0 06/24/2020   Lab Results  Component Value Date   NA 142 06/24/2020   K 5.0 06/24/2020   CO2 27 06/24/2020   GLUCOSE 153 (H) 06/24/2020   BUN 25 (H) 06/24/2020   CREATININE 1.50 06/24/2020   BILITOT 0.4 06/24/2020   ALKPHOS 63 06/24/2020   AST 15 06/24/2020   ALT 13 06/24/2020   PROT 6.6 06/24/2020   ALBUMIN 4.2 06/24/2020   CALCIUM 9.5 06/24/2020   ANIONGAP 10 05/03/2015   GFR 42.20 (L) 06/24/2020   Lab Results  Component Value Date   CHOL 118 06/24/2020   Lab Results  Component Value Date   HDL 41.40 06/24/2020   Lab Results  Component Value Date   LDLCALC 58 06/24/2020  Lab Results  Component Value Date   TRIG 96.0 06/24/2020   Lab Results  Component Value Date   CHOLHDL 3 06/24/2020   Lab Results  Component Value Date   HGBA1C 7.7 (H) 06/24/2020       Assessment & Plan:   Problem List Items Addressed This Visit    Diabetes mellitus type 2, noninsulin dependent ([place])    Requesting old records from previous PMD at [place] 7.7 today. hgba1c acceptable, minimize simple carbs. Increase exercise as tolerated. Continue current meds      Relevant Medications   pioglitazone (ACTOS) 15 MG tablet   rosuvastatin (CRESTOR) 5 MG tablet   Other Relevant Orders   Hemoglobin A1c (Completed)   Dyspnea   Relevant  Medications   nitroGLYCERIN (NITROSTAT) 0.4 MG SL tablet   Hyperlipidemia    Encouraged heart healthy diet, increase exercise, avoid trans fats, consider a krill oil cap daily      Relevant Medications   nitroGLYCERIN (NITROSTAT) 0.4 MG SL tablet   rosuvastatin (CRESTOR) 5 MG tablet   Other Relevant Orders   Lipid panel (Completed)   NSTEMI (non-ST elevated myocardial infarction) (HCC)   Relevant Medications   nitroGLYCERIN (NITROSTAT) 0.4 MG SL tablet   rosuvastatin (CRESTOR) 5 MG tablet   CAD (coronary artery disease)    Has been stable since his MI several years ago. Continue to follow up with cardiology      Relevant Medications   nitroGLYCERIN (NITROSTAT) 0.4 MG SL tablet   rosuvastatin (CRESTOR) 5 MG tablet   Essential hypertension - Primary    Well controlled, no changes to meds. Encouraged heart healthy diet such as the DASH diet and exercise as tolerated.       Relevant Medications   nitroGLYCERIN (NITROSTAT) 0.4 MG SL tablet   rosuvastatin (CRESTOR) 5 MG tablet   Other Relevant Orders   CBC (Completed)   Comprehensive metabolic panel (Completed)   TSH (Completed)   Anemia    Increase leafy greens, consider increased lean red meat and using cast iron cookware. Continue to monitor, report any concerns.      Relevant Medications   ferrous sulfate 325 (65 FE) MG tablet   nitroGLYCERIN (NITROSTAT) 0.4 MG SL tablet   Renal insufficiency    Improve hydration and monitor. Avoid OTC medications and supplements      BCC (basal cell carcinoma of skin)    Follows with Dr Dimple Nanas and has had several lesion.          I have discontinued [place] G. Gullick's zolpidem and rosuvastatin. I have also changed his ferrous sulfate. Additionally, I am having him start on rosuvastatin, traMADol, and zolpidem. Lastly, I am having him maintain his glimepiride, doxazosin, omeprazole, metFORMIN, Coenzyme Q10 (CO Q 10 PO), Cholecalciferol, metoprolol tartrate, lisinopril,  aspirin EC, pioglitazone, and nitroGLYCERIN.  Meds ordered this encounter  Medications  . ferrous sulfate 325 (65 FE) MG tablet    Sig: Take 1 tablet (325 mg total) by mouth every other day.    Refill:  0  . nitroGLYCERIN (NITROSTAT) 0.4 MG SL tablet    Sig: Place 1 tablet (0.4 mg total) under the tongue every 5 (five) minutes as needed for chest pain.    Dispense:  25 tablet    Refill:  3  . rosuvastatin (CRESTOR) 5 MG tablet    Sig: Take 1 tablet (5 mg total) by mouth daily.    Dispense:  90 tablet    Refill:  1  .  traMADol (ULTRAM) 50 MG tablet    Sig: Take 1 tablet (50 mg total) by mouth daily as needed for up to 5 days for severe pain.    Dispense:  15 tablet    Refill:  0  . zolpidem (AMBIEN) 10 MG tablet    Sig: Take 1 tablet (10 mg total) by mouth at bedtime as needed for sleep.    Dispense:  90 tablet    Refill:  1     Penni Homans, MD

## 2020-06-27 NOTE — Addendum Note (Signed)
Addended byDamita Dunnings D on: 06/27/2020 01:47 PM   Modules accepted: Orders

## 2020-07-04 DIAGNOSIS — L821 Other seborrheic keratosis: Secondary | ICD-10-CM | POA: Diagnosis not present

## 2020-07-04 DIAGNOSIS — D485 Neoplasm of uncertain behavior of skin: Secondary | ICD-10-CM | POA: Diagnosis not present

## 2020-07-04 DIAGNOSIS — L570 Actinic keratosis: Secondary | ICD-10-CM | POA: Diagnosis not present

## 2020-07-04 DIAGNOSIS — C4441 Basal cell carcinoma of skin of scalp and neck: Secondary | ICD-10-CM | POA: Diagnosis not present

## 2020-07-04 DIAGNOSIS — C44319 Basal cell carcinoma of skin of other parts of face: Secondary | ICD-10-CM | POA: Diagnosis not present

## 2020-07-04 DIAGNOSIS — C44519 Basal cell carcinoma of skin of other part of trunk: Secondary | ICD-10-CM | POA: Diagnosis not present

## 2020-07-08 ENCOUNTER — Ambulatory Visit (HOSPITAL_BASED_OUTPATIENT_CLINIC_OR_DEPARTMENT_OTHER)
Admission: RE | Admit: 2020-07-08 | Discharge: 2020-07-08 | Disposition: A | Discharge status: Home / Self Care | Payer: Medicare HMO | Source: Ambulatory Visit | Attending: Cardiology | Admitting: Cardiology

## 2020-07-08 ENCOUNTER — Other Ambulatory Visit: Payer: Self-pay

## 2020-07-08 DIAGNOSIS — I2510 Atherosclerotic heart disease of native coronary artery without angina pectoris: Secondary | ICD-10-CM

## 2020-07-08 LAB — ECHOCARDIOGRAM COMPLETE
Area-P 1/2: 3.23 cm2
MV M vel: 4.93 m/s
MV Peak grad: 97.2 mmHg
S' Lateral: 2.61 cm

## 2020-07-12 ENCOUNTER — Other Ambulatory Visit (HOSPITAL_BASED_OUTPATIENT_CLINIC_OR_DEPARTMENT_OTHER): Payer: Self-pay | Admitting: Dentistry

## 2020-07-12 ENCOUNTER — Encounter: Payer: Self-pay | Admitting: *Deleted

## 2020-07-12 MED FILL — CHLORHEXIDINE 0.12% RINSE: 0.12 | 16 days supply | Qty: 473 | Fill #0

## 2020-07-12 MED FILL — CEPHALEXIN 500 MG CAPSULE: 500 | 7 days supply | Qty: 21 | Fill #0

## 2020-07-28 ENCOUNTER — Other Ambulatory Visit: Payer: Self-pay

## 2020-07-28 ENCOUNTER — Other Ambulatory Visit (INDEPENDENT_AMBULATORY_CARE_PROVIDER_SITE_OTHER): Payer: Medicare HMO

## 2020-07-28 DIAGNOSIS — D649 Anemia, unspecified: Secondary | ICD-10-CM

## 2020-07-28 LAB — CBC WITH DIFFERENTIAL/PLATELET
Basophils Absolute: 0.1 10*3/uL (ref 0.0–0.1)
Basophils Relative: 0.6 % (ref 0.0–3.0)
Eosinophils Absolute: 0.3 10*3/uL (ref 0.0–0.7)
Eosinophils Relative: 3.1 % (ref 0.0–5.0)
HCT: 34.2 % — ABNORMAL LOW (ref 39.0–52.0)
Hemoglobin: 11.4 g/dL — ABNORMAL LOW (ref 13.0–17.0)
Lymphocytes Relative: 24 % (ref 12.0–46.0)
Lymphs Abs: 2.3 10*3/uL (ref 0.7–4.0)
MCHC: 33.4 g/dL (ref 30.0–36.0)
MCV: 94.4 fl (ref 78.0–100.0)
Monocytes Absolute: 0.7 10*3/uL (ref 0.1–1.0)
Monocytes Relative: 7.2 % (ref 3.0–12.0)
Neutro Abs: 6.3 10*3/uL (ref 1.4–7.7)
Neutrophils Relative %: 65.1 % (ref 43.0–77.0)
Platelets: 201 10*3/uL (ref 150.0–400.0)
RBC: 3.62 Mil/uL — ABNORMAL LOW (ref 4.22–5.81)
RDW: 13.7 % (ref 11.5–15.5)
WBC: 9.7 10*3/uL (ref 4.0–10.5)

## 2020-07-28 LAB — COMPREHENSIVE METABOLIC PANEL
ALT: 16 U/L (ref 0–53)
AST: 17 U/L (ref 0–37)
Albumin: 4 g/dL (ref 3.5–5.2)
Alkaline Phosphatase: 63 U/L (ref 39–117)
BUN: 23 mg/dL (ref 6–23)
CO2: 28 mEq/L (ref 19–32)
Calcium: 9.2 mg/dL (ref 8.4–10.5)
Chloride: 102 mEq/L (ref 96–112)
Creatinine, Ser: 1.44 mg/dL (ref 0.40–1.50)
GFR: 44.28 mL/min — ABNORMAL LOW (ref >60.00)
Glucose, Bld: 180 mg/dL — ABNORMAL HIGH (ref 70–99)
Potassium: 4.9 mEq/L (ref 3.5–5.1)
Sodium: 139 mEq/L (ref 135–145)
Total Bilirubin: 0.4 mg/dL (ref 0.2–1.2)
Total Protein: 6.7 g/dL (ref 6.0–8.3)

## 2020-08-23 DIAGNOSIS — C44319 Basal cell carcinoma of skin of other parts of face: Secondary | ICD-10-CM | POA: Diagnosis not present

## 2020-11-07 ENCOUNTER — Other Ambulatory Visit: Payer: Self-pay | Admitting: Family Medicine

## 2020-11-09 ENCOUNTER — Other Ambulatory Visit: Payer: Self-pay | Admitting: *Deleted

## 2020-11-09 ENCOUNTER — Other Ambulatory Visit (HOSPITAL_BASED_OUTPATIENT_CLINIC_OR_DEPARTMENT_OTHER): Payer: Self-pay

## 2020-11-09 MED ORDER — FINASTERIDE 5 MG PO TABS: 5.0000 mg | ORAL_TABLET | Freq: Every day | ORAL | 1 refills | Status: DC

## 2020-11-09 MED ORDER — GLIMEPIRIDE 4 MG PO TABS: 4.0000 mg | ORAL_TABLET | Freq: Every day | ORAL | 1 refills | Status: DC

## 2020-11-09 MED ORDER — CHLORHEXIDINE GLUCONATE 0.12 % MT SOLN
OROMUCOSAL | 0 refills | Status: DC
  Filled 2020-11-09: qty 473, 30d supply, fill #0

## 2020-11-09 MED ORDER — PIOGLITAZONE HCL 15 MG PO TABS: 15.0000 mg | ORAL_TABLET | Freq: Every day | ORAL | 1 refills | Status: DC

## 2020-11-09 NOTE — Telephone Encounter (Signed)
Left message on machine that rxs have been sent in.  [employer] may have sent the requests to the old provider.

## 2020-11-24 ENCOUNTER — Other Ambulatory Visit: Payer: Self-pay | Admitting: Family Medicine

## 2020-11-24 ENCOUNTER — Other Ambulatory Visit (HOSPITAL_BASED_OUTPATIENT_CLINIC_OR_DEPARTMENT_OTHER): Payer: Self-pay

## 2020-11-25 ENCOUNTER — Other Ambulatory Visit (HOSPITAL_BASED_OUTPATIENT_CLINIC_OR_DEPARTMENT_OTHER): Payer: Self-pay

## 2020-11-25 MED ORDER — METOPROLOL TARTRATE 25 MG PO TABS
12.5000 mg | ORAL_TABLET | Freq: Two times a day (BID) | ORAL | 3 refills | Status: DC
  Filled 2020-11-25: qty 90, 90d supply, fill #0

## 2020-11-25 NOTE — Telephone Encounter (Signed)
Patient has two pills left

## 2020-11-28 ENCOUNTER — Other Ambulatory Visit: Payer: Self-pay | Admitting: *Deleted

## 2020-11-28 MED ORDER — METOPROLOL TARTRATE 25 MG PO TABS: 12.5000 mg | ORAL_TABLET | Freq: Two times a day (BID) | ORAL | 3 refills | Status: DC

## 2020-12-22 ENCOUNTER — Other Ambulatory Visit: Payer: Self-pay

## 2020-12-22 ENCOUNTER — Ambulatory Visit (INDEPENDENT_AMBULATORY_CARE_PROVIDER_SITE_OTHER): Payer: Medicare HMO | Admitting: Family Medicine

## 2020-12-22 VITALS — BP 120/72 | HR 92 | Temp 98.1°F | Resp 16 | Wt 155.9 lb

## 2020-12-22 DIAGNOSIS — D649 Anemia, unspecified: Secondary | ICD-10-CM | POA: Diagnosis not present

## 2020-12-22 DIAGNOSIS — I214 Non-ST elevation (NSTEMI) myocardial infarction: Secondary | ICD-10-CM | POA: Diagnosis not present

## 2020-12-22 DIAGNOSIS — E119 Type 2 diabetes mellitus without complications: Secondary | ICD-10-CM

## 2020-12-22 DIAGNOSIS — I10 Essential (primary) hypertension: Secondary | ICD-10-CM

## 2020-12-22 DIAGNOSIS — E785 Hyperlipidemia, unspecified: Secondary | ICD-10-CM | POA: Diagnosis not present

## 2020-12-22 DIAGNOSIS — N289 Disorder of kidney and ureter, unspecified: Secondary | ICD-10-CM

## 2020-12-22 DIAGNOSIS — Z23 Encounter for immunization: Secondary | ICD-10-CM | POA: Diagnosis not present

## 2020-12-22 DIAGNOSIS — G4700 Insomnia, unspecified: Secondary | ICD-10-CM

## 2020-12-22 DIAGNOSIS — S61219A Laceration without foreign body of unspecified finger without damage to nail, initial encounter: Secondary | ICD-10-CM | POA: Diagnosis not present

## 2020-12-22 DIAGNOSIS — R0600 Dyspnea, unspecified: Secondary | ICD-10-CM | POA: Diagnosis not present

## 2020-12-22 DIAGNOSIS — I2510 Atherosclerotic heart disease of native coronary artery without angina pectoris: Secondary | ICD-10-CM

## 2020-12-22 DIAGNOSIS — M549 Dorsalgia, unspecified: Secondary | ICD-10-CM

## 2020-12-22 LAB — CBC
HCT: 29.6 % — ABNORMAL LOW (ref 39.0–52.0)
Hemoglobin: 10.1 g/dL — ABNORMAL LOW (ref 13.0–17.0)
MCHC: 34 g/dL (ref 30.0–36.0)
MCV: 95 fl (ref 78.0–100.0)
Platelets: 206 10*3/uL (ref 150.0–400.0)
RBC: 3.12 Mil/uL — ABNORMAL LOW (ref 4.22–5.81)
RDW: 14.1 % (ref 11.5–15.5)
WBC: 7.7 10*3/uL (ref 4.0–10.5)

## 2020-12-22 LAB — COMPREHENSIVE METABOLIC PANEL
ALT: 14 U/L (ref 0–53)
AST: 17 U/L (ref 0–37)
Albumin: 4.1 g/dL (ref 3.5–5.2)
Alkaline Phosphatase: 65 U/L (ref 39–117)
BUN: 30 mg/dL — ABNORMAL HIGH (ref 6–23)
CO2: 26 mEq/L (ref 19–32)
Calcium: 9.4 mg/dL (ref 8.4–10.5)
Chloride: 106 mEq/L (ref 96–112)
Creatinine, Ser: 1.6 mg/dL — ABNORMAL HIGH (ref 0.40–1.50)
GFR: 38.91 mL/min — ABNORMAL LOW (ref >60.00)
Glucose, Bld: 164 mg/dL — ABNORMAL HIGH (ref 70–99)
Potassium: 5.4 mEq/L — ABNORMAL HIGH (ref 3.5–5.1)
Sodium: 141 mEq/L (ref 135–145)
Total Bilirubin: 0.3 mg/dL (ref 0.2–1.2)
Total Protein: 6.7 g/dL (ref 6.0–8.3)

## 2020-12-22 LAB — LIPID PANEL
Cholesterol: 109 mg/dL (ref 0–200)
HDL: 39.9 mg/dL (ref >39.00)
LDL Cholesterol: 56 mg/dL (ref 0–99)
NonHDL: 68.6
Total CHOL/HDL Ratio: 3
Triglycerides: 64 mg/dL (ref 0.0–149.0)
VLDL: 12.8 mg/dL (ref 0.0–40.0)

## 2020-12-22 LAB — HEMOGLOBIN A1C: Hgb A1c MFr Bld: 7.2 % — ABNORMAL HIGH (ref 4.6–6.5)

## 2020-12-22 LAB — TSH: TSH: 3.5 u[IU]/mL (ref 0.35–5.50)

## 2020-12-22 MED ORDER — LISINOPRIL 2.5 MG PO TABS: 2.5000 mg | ORAL_TABLET | Freq: Every day | ORAL | 2 refills | Status: DC

## 2020-12-22 MED ORDER — METFORMIN HCL 500 MG PO TABS: 1000.0000 mg | ORAL_TABLET | Freq: Two times a day (BID) | ORAL | 2 refills | Status: DC

## 2020-12-22 MED ORDER — TRAMADOL HCL 50 MG PO TABS: 50.0000 mg | ORAL_TABLET | Freq: Two times a day (BID) | ORAL | 0 refills | Status: DC | PRN

## 2020-12-22 NOTE — Patient Instructions (Addendum)
Shingrix is the new shingles shot, 2 shots over 2-6 months, confirm coverage with insurance and document, then can return here for shots with nurse appt or at pharmacy    Paxlovid is the new COVID medication we can give you if you get COVID so make sure you test if you have symptoms because we have to treat by day 5 of symptoms for it to be effective. If you are positive let us know so we can treat. If a home test is negative and your symptoms are persistent get a PCR test. Can check testing locations at [email or web address] If you are positive we will make an appointment with [place] and we will send in Paxlovid if you would like it. Check with your pharmacy before we meet to confirm they have it in stock, if they do not then we can get the prescription at the [facility]

## 2020-12-22 NOTE — Progress Notes (Signed)
Patient ID: Hayden Garza, male    DOB: 08/17/1932  Age: 87 y.o. MRN: [id number]    Subjective:  Subjective  HPI Hayden Garza presents for office visit today for follow up on type 2 diabetes and CAD. He states that he uses tramadol when his right shoulder pain gets worse. He takes around 1-2 tablets when experiencing shoulder pain. He reports that he has had a right shoulder replacement surgery. Takes ambient to help with sleep. Denies CP/palp/SOB/HA/congestion/fevers/GI or GU c/o. Taking meds as prescribed. He reports that he injured his left wrist and states having a skin laceration on his left wrist.   Review of Systems  Constitutional:  Negative for chills, fatigue and fever.  HENT:  Negative for congestion, rhinorrhea, sinus pressure, sinus pain, sore throat and trouble swallowing.   Eyes:  Negative for pain.  Respiratory:  Negative for cough and shortness of breath.   Cardiovascular:  Negative for chest pain, palpitations and leg swelling.  Gastrointestinal:  Negative for abdominal pain, blood in stool, diarrhea, nausea and vomiting.  Genitourinary:  Negative for flank pain, frequency and penile pain.  Musculoskeletal:  Negative for back pain.  Skin:  Positive for wound (laceration on left wrist).  Neurological:  Negative for headaches.   History Past Medical History:  Diagnosis Date   Allergic rhinitis    Anemia    BPH (benign prostatic hyperplasia)    With obstruction/lower urinary tract symptoms. Takes cardura for prostate.    CAD (coronary artery disease)    a. 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   His family history includes CAD in his father; Colitis in his sister; Diabetes in his brother and son; Heart attack in his father; Heart disease in his brother and mother; Hyperlipidemia in his brother; Hypertension in his brother; Kidney failure in his brother; Other in his brother.He reports that he has never smoked. He has never used smokeless tobacco. He reports that he does not drink alcohol and does not use drugs.  Current Outpatient Medications on File Prior to Visit  Medication Sig Dispense Refill   aspirin EC 81 MG tablet Take 4 tablets (325 mg total) by mouth daily.     chlorhexidine (PERIDEX) 0.12 % solution AFTER BRUSHING, RINSE WITH ONE CAPFUL FOR ONE MINUTE 2 TIMES DAILY THN SPIT OUT 473 mL 0   chlorhexidine (PERIDEX) 0.12 % solution After brushing, rinse with one capful for one minute twice a day then spit out 473 mL 0   Cholecalciferol 1000 UNITS capsule Take 1,000 Units by mouth daily.     Coenzyme Q10 (CO Q 10 PO) Take 1 tablet by mouth daily.     doxazosin (CARDURA) 8 MG tablet Take 8 mg by mouth at bedtime.     ferrous sulfate 325 (65 FE) MG tablet Take 1 tablet (325 mg total) by mouth every other day.  0   finasteride (PROSCAR) 5 MG tablet Take 1 tablet (5 mg  total) by mouth daily. 90 tablet 1   glimepiride (AMARYL) 4 MG tablet Take 1 tablet (4 mg total) by mouth daily before breakfast. 90 tablet 1   metoprolol tartrate (LOPRESSOR) 25 MG tablet Take 1/2 tablets (12.5 mg total) by mouth 2 (two) times daily. 180 tablet 3   nitroGLYCERIN (NITROSTAT) 0.4 MG SL tablet PLACE 1 TABLET UNDER THE TONGUE EVERY 5 MINUTES AS NEEDED FOR CHESTPAIN 25 tablet 3   omeprazole (PRILOSEC) 20 MG capsule Take 20 mg by mouth daily as needed. heartburn     omeprazole (PRILOSEC) 40 MG capsule Take 1 capsule by mouth daily.      pioglitazone (ACTOS) 15 MG tablet Take 1 tablet (15 mg total) by mouth daily. 90 tablet 1   rosuvastatin (CRESTOR) 5 MG tablet TAKE 1 TABLET EVERY DAY 90 tablet 1   zolpidem (AMBIEN) 10 MG tablet Take 1 tablet (10 mg total) by mouth at bedtime as needed for sleep. 90 tablet 1   No current facility-administered medications on file prior to visit.     Objective:  Objective  Physical Exam Constitutional:      General: He is not in acute distress.    Appearance: Normal appearance. He is not ill-appearing or toxic-appearing.  HENT:     Head: Normocephalic and atraumatic.     Right Ear: Tympanic membrane, ear canal and external ear normal.     Left Ear: Tympanic membrane, ear canal and external ear normal.     Nose: No congestion or rhinorrhea.  Eyes:     Extraocular Movements: Extraocular movements intact.     Pupils: Pupils are equal, round, and reactive to light.  Cardiovascular:     Rate and Rhythm: Normal rate and regular rhythm.     Pulses: Normal pulses.     Heart sounds: Normal heart sounds. No murmur heard. Pulmonary:     Effort: Pulmonary effort is normal. No respiratory distress.     Breath sounds: Normal breath sounds. No wheezing, rhonchi or rales.  Abdominal:     General: Bowel sounds are normal.     Palpations: Abdomen is soft. There is no mass.     Tenderness: no abdominal tenderness There is no guarding.     Hernia: No hernia is present.  Musculoskeletal:        General: Normal range of motion.     Cervical back: Normal range of motion and neck supple.  Skin:    General: Skin is warm and dry.  Neurological:     Mental Status: He is alert and oriented to person, place, and time.  Psychiatric:        Behavior: Behavior normal.   BP 120/72   Pulse 92   Temp 98.1 F (36.7 C)   Resp 16   Wt 155 lb 14.4 oz (70.7 kg)   SpO2 98%   BMI 25.94 kg/m  Wt Readings from Last 3 Encounters:  12/22/20 155 lb 14.4 oz (70.7 kg)  06/24/20 159 lb (72.1 kg)  06/15/20 159 lb  6.4 oz (72.3 kg)     Lab Results  Component Value Date   WBC 7.7 12/22/2020   HGB 10.1 (L) 12/22/2020   HCT 29.6 (L) 12/22/2020   PLT 206.0 12/22/2020   GLUCOSE 164 (H) 12/22/2020   CHOL 109 12/22/2020   TRIG 64.0 12/22/2020   HDL 39.90 12/22/2020   LDLCALC 56 12/22/2020   ALT 14 12/22/2020   AST 17 12/22/2020   NA 141 12/22/2020   K 5.4  No hemolysis seen (H) 12/22/2020   CL 106 12/22/2020   CREATININE 1.60 (H) 12/22/2020   BUN 30 (H) 12/22/2020   CO2 26 12/22/2020   TSH 3.50 12/22/2020   INR 1.13 05/01/2015   HGBA1C 7.2 (H) 12/22/2020    ECHOCARDIOGRAM COMPLETE  Result Date: 07/08/2020    ECHOCARDIOGRAM REPORT   Patient Name:   Hayden Garza Date of Exam: 07/08/2020 Medical Rec #:  [id number]      Height:       65.0 in Accession #:    [id number]     Weight:       159.0 lb Date of Birth:  06-17-1932      BSA:          1.794 m Patient Age:    69 years       BP:           118/62 mmHg Patient Gender: M              HR:           64 bpm. Exam Location:  [facility] Procedure: 2D Echo, 3D Echo, Cardiac Doppler and Color Doppler Indications:    I25.110 Atherosclerotic heart disease of native coronary artery                 with unstable angina pectoris  History:        Patient has prior history of Echocardiogram examinations, most                 recent 05/01/2015. Previous Myocardial Infarction and CAD; Risk                 Factors:Hypertension, Diabetes and Dyslipidemia.  Sonographer:    Geradine Girt Referring Phys: [place]  1. Left ventricular ejection fraction, by estimation, is 60 to 65%. The left ventricle has normal function. The left ventricle has no regional wall motion abnormalities. There is mild left ventricular hypertrophy. Left ventricular diastolic parameters are consistent with Grade I diastolic dysfunction (impaired relaxation).  2. The mitral valve is normal in structure. Mild mitral valve regurgitation. No evidence of mitral stenosis. FINDINGS  Left  Ventricle: Left ventricular ejection fraction, by estimation, is 60 to 65%. The left ventricle has normal function. The left ventricle has no regional wall motion abnormalities. The left ventricular internal cavity size was normal in size. There is  mild left ventricular hypertrophy. Left ventricular diastolic parameters are consistent with Grade I diastolic dysfunction (impaired relaxation). Right Ventricle: The right ventricular size is normal. No increase in right ventricular wall thickness. Right ventricular systolic function is normal. There is normal pulmonary artery systolic pressure. The tricuspid regurgitant velocity is 2.07 m/s, and  with an assumed right atrial pressure of 3 mmHg, the estimated right ventricular systolic pressure is 95.2 mmHg. Left Atrium: Left atrial size was normal in size. Right Atrium: Right atrial size was normal in size. Pericardium: There is no evidence of pericardial effusion. Mitral Valve: The mitral valve is normal in structure. Mild mitral valve regurgitation. No evidence of mitral valve stenosis. Tricuspid Valve: The tricuspid valve is normal in structure. Tricuspid valve regurgitation is trivial. No evidence of tricuspid stenosis. Aortic Valve: The aortic valve is normal in structure. Aortic valve regurgitation is not visualized. No aortic stenosis is present. Pulmonic Valve: The pulmonic valve was normal in structure. Pulmonic valve regurgitation is not visualized. No evidence of pulmonic stenosis. Aorta: The aortic root is normal in size and structure.  Venous: The inferior vena cava is normal in size with greater than 50% respiratory variability, suggesting right atrial pressure of 3 mmHg. IAS/Shunts: No atrial level shunt detected by color flow Doppler.  LEFT VENTRICLE PLAX 2D LVIDd:         4.36 cm  Diastology LVIDs:         2.61 cm  LV e' medial:    6.42 cm/s LV PW:         1.42 cm  LV E/e' medial:  13.8 LV IVS:        1.63 cm  LV e' lateral:   7.51 cm/s LVOT diam:      2.00 cm  LV E/e' lateral: 11.8 LV SV:         65 LV SV Index:   36 LVOT Area:     3.14 cm                          3D Volume EF:                         3D EF:        60 %                         LV EDV:       117 ml                         LV ESV:       46 ml                         LV SV:        71 ml RIGHT VENTRICLE RV S prime:     9.79 cm/s LEFT ATRIUM           Index       RIGHT ATRIUM           Index LA diam:      3.70 cm 2.06 cm/m  RA Area:     10.90 cm LA Vol (A4C): 34.5 ml 19.23 ml/m RA Volume:   22.20 ml  12.37 ml/m  AORTIC VALVE LVOT Vmax:   83.60 cm/s LVOT Vmean:  55.300 cm/s LVOT VTI:    0.207 m  AORTA Ao Root diam: 2.70 cm Ao Asc diam:  3.00 cm MITRAL VALVE               TRICUSPID VALVE MV Area (PHT): 3.23 cm    TR Peak grad:   17.1 mmHg MV Decel Time: 235 msec    TR Vmax:        207.00 cm/s MR Peak grad: 97.2 mmHg MR Vmax:      493.00 cm/s  SHUNTS MV E velocity: 88.70 cm/s  Systemic VTI:  0.21 m MV A velocity: 98.50 cm/s  Systemic Diam: 2.00 cm MV E/A ratio:  0.90 Sunny Schlein Revankar MD Electronically signed by Jyl Heinz MD Signature Date/Time: 07/08/2020/1:45:43 PM    Final      Assessment & Plan:  Plan    Meds ordered this encounter  Medications   metFORMIN (GLUCOPHAGE) 500 MG tablet    Sig: Take 2 tablets (1,000 mg total) by mouth 2 (two) times daily with a meal.    Dispense:  180 tablet    Refill:  2   lisinopril (ZESTRIL) 2.5 MG  tablet    Sig: Take 1 tablet (2.5 mg total) by mouth daily.    Dispense:  90 tablet    Refill:  2   traMADol (ULTRAM) 50 MG tablet    Sig: Take 1 tablet (50 mg total) by mouth every 12 (twelve) hours as needed for up to 5 days.    Dispense:  90 tablet    Refill:  0    Problem List Items Addressed This Visit     Diabetes mellitus type 2, noninsulin dependent ([place]) - Primary    hgba1c acceptable, minimize simple carbs. Increase exercise as tolerated. Continue current meds. Refill given on Lisinopril today and Metformin       Relevant  Medications   metFORMIN (GLUCOPHAGE) 500 MG tablet   lisinopril (ZESTRIL) 2.5 MG tablet   Other Relevant Orders   Comprehensive metabolic panel (Completed)   TSH (Completed)   Hemoglobin A1c (Completed)   Dyspnea   Relevant Medications   lisinopril (ZESTRIL) 2.5 MG tablet   Hyperlipidemia    Tolerating statin, encouraged heart healthy diet, avoid trans fats, minimize simple carbs and saturated fats. Increase exercise as tolerated       Relevant Medications   lisinopril (ZESTRIL) 2.5 MG tablet   Other Relevant Orders   Lipid panel (Completed)   TSH (Completed)   NSTEMI (non-ST elevated myocardial infarction) ([place])   Relevant Medications   lisinopril (ZESTRIL) 2.5 MG tablet   CAD (coronary artery disease)    Asymptomatic and risk factors treated.        Relevant Medications   lisinopril (ZESTRIL) 2.5 MG tablet   Essential hypertension    Well controlled, no changes to meds. Encouraged heart healthy diet such as the DASH diet and exercise as tolerated.        Relevant Medications   lisinopril (ZESTRIL) 2.5 MG tablet   Anemia   Relevant Medications   lisinopril (ZESTRIL) 2.5 MG tablet   Other Relevant Orders   CBC (Completed)   Renal insufficiency    Hydrate and monitor       Insomnia    Encouraged good sleep hygiene such as dark, quiet room. No blue/green glowing lights such as computer screens in bedroom. No alcohol or stimulants in evening. Cut down on caffeine as able. Regular exercise is helpful but not just prior to bed time. Has been on Ambien for some time and notes it helps with sleep without side effects, may continue prn use       Other Visit Diagnoses     Back pain, unspecified back location, unspecified back pain laterality, unspecified chronicity       Relevant Medications   traMADol (ULTRAM) 50 MG tablet   Laceration of finger of left hand without damage to nail, foreign body presence unspecified, unspecified finger, initial encounter        Relevant Orders   Tdap vaccine greater than or equal to 7yo IM (Completed)       Follow-up: Return in about 6 months (around 06/24/2021) for annual exam.  I, Suezanne Jacquet, acting as a scribe for Penni Homans, MD, have documented all relevent documentation on behalf of Penni Homans, MD, as directed by Penni Homans, MD while in the presence of Penni Homans, MD.  I, Mosie Lukes, MD personally performed the services described in this documentation. All medical record entries made by the scribe were at my direction and in my presence. I have reviewed the chart and agree that the record reflects my personal performance and  is accurate and complete

## 2020-12-23 ENCOUNTER — Encounter: Payer: Self-pay | Admitting: *Deleted

## 2020-12-23 ENCOUNTER — Other Ambulatory Visit: Payer: Self-pay | Admitting: *Deleted

## 2020-12-23 DIAGNOSIS — R799 Abnormal finding of blood chemistry, unspecified: Secondary | ICD-10-CM

## 2020-12-23 DIAGNOSIS — R7989 Other specified abnormal findings of blood chemistry: Secondary | ICD-10-CM

## 2020-12-23 DIAGNOSIS — D649 Anemia, unspecified: Secondary | ICD-10-CM

## 2020-12-24 DIAGNOSIS — G4700 Insomnia, unspecified: Secondary | ICD-10-CM | POA: Insufficient documentation

## 2020-12-24 NOTE — Assessment & Plan Note (Signed)
Well controlled, no changes to meds. Encouraged heart healthy diet such as the DASH diet and exercise as tolerated.  °

## 2020-12-24 NOTE — Assessment & Plan Note (Signed)
Tolerating statin, encouraged heart healthy diet, avoid trans fats, minimize simple carbs and saturated fats. Increase exercise as tolerated 

## 2020-12-24 NOTE — Assessment & Plan Note (Signed)
Asymptomatic and risk factors treated.

## 2020-12-24 NOTE — Assessment & Plan Note (Signed)
Encouraged good sleep hygiene such as dark, quiet room. No blue/green glowing lights such as computer screens in bedroom. No alcohol or stimulants in evening. Cut down on caffeine as able. Regular exercise is helpful but not just prior to bed time. Has been on Ambien for some time and notes it helps with sleep without side effects, may continue prn use

## 2020-12-24 NOTE — Assessment & Plan Note (Signed)
Hydrate and monitor 

## 2020-12-24 NOTE — Assessment & Plan Note (Signed)
hgba1c acceptable, minimize simple carbs. Increase exercise as tolerated. Continue current meds. Refill given on Lisinopril today and Metformin

## 2020-12-29 ENCOUNTER — Other Ambulatory Visit: Payer: Self-pay

## 2020-12-29 ENCOUNTER — Other Ambulatory Visit (INDEPENDENT_AMBULATORY_CARE_PROVIDER_SITE_OTHER): Payer: Medicare HMO

## 2020-12-29 DIAGNOSIS — R7989 Other specified abnormal findings of blood chemistry: Secondary | ICD-10-CM | POA: Diagnosis not present

## 2020-12-29 DIAGNOSIS — R799 Abnormal finding of blood chemistry, unspecified: Secondary | ICD-10-CM | POA: Diagnosis not present

## 2020-12-29 DIAGNOSIS — D649 Anemia, unspecified: Secondary | ICD-10-CM | POA: Diagnosis not present

## 2020-12-29 DIAGNOSIS — D539 Nutritional anemia, unspecified: Secondary | ICD-10-CM

## 2020-12-29 LAB — COMPREHENSIVE METABOLIC PANEL
ALT: 14 U/L (ref 0–53)
AST: 16 U/L (ref 0–37)
Albumin: 3.9 g/dL (ref 3.5–5.2)
Alkaline Phosphatase: 60 U/L (ref 39–117)
BUN: 23 mg/dL (ref 6–23)
CO2: 26 mEq/L (ref 19–32)
Calcium: 8.9 mg/dL (ref 8.4–10.5)
Chloride: 107 mEq/L (ref 96–112)
Creatinine, Ser: 1.58 mg/dL — ABNORMAL HIGH (ref 0.40–1.50)
GFR: 39.5 mL/min — ABNORMAL LOW (ref >60.00)
Glucose, Bld: 151 mg/dL — ABNORMAL HIGH (ref 70–99)
Potassium: 5.1 mEq/L (ref 3.5–5.1)
Sodium: 142 mEq/L (ref 135–145)
Total Bilirubin: 0.4 mg/dL (ref 0.2–1.2)
Total Protein: 6.3 g/dL (ref 6.0–8.3)

## 2020-12-29 LAB — CBC WITH DIFFERENTIAL/PLATELET
Basophils Absolute: 0 10*3/uL (ref 0.0–0.1)
Basophils Relative: 0.7 % (ref 0.0–3.0)
Eosinophils Absolute: 0.3 10*3/uL (ref 0.0–0.7)
Eosinophils Relative: 4.8 % (ref 0.0–5.0)
HCT: 28.6 % — ABNORMAL LOW (ref 39.0–52.0)
Hemoglobin: 9.6 g/dL — ABNORMAL LOW (ref 13.0–17.0)
Lymphocytes Relative: 29.7 % (ref 12.0–46.0)
Lymphs Abs: 2.1 10*3/uL (ref 0.7–4.0)
MCHC: 33.7 g/dL (ref 30.0–36.0)
MCV: 95.8 fl (ref 78.0–100.0)
Monocytes Absolute: 0.8 10*3/uL (ref 0.1–1.0)
Monocytes Relative: 11.2 % (ref 3.0–12.0)
Neutro Abs: 3.8 10*3/uL (ref 1.4–7.7)
Neutrophils Relative %: 53.6 % (ref 43.0–77.0)
Platelets: 204 10*3/uL (ref 150.0–400.0)
RBC: 2.99 Mil/uL — ABNORMAL LOW (ref 4.22–5.81)
RDW: 13.7 % (ref 11.5–15.5)
WBC: 7.1 10*3/uL (ref 4.0–10.5)

## 2020-12-29 LAB — VITAMIN B12: Vitamin B-12: 258 pg/mL (ref 211–911)

## 2020-12-30 ENCOUNTER — Telehealth: Payer: Self-pay | Admitting: *Deleted

## 2020-12-30 ENCOUNTER — Other Ambulatory Visit (INDEPENDENT_AMBULATORY_CARE_PROVIDER_SITE_OTHER): Payer: Medicare HMO

## 2020-12-30 DIAGNOSIS — D649 Anemia, unspecified: Secondary | ICD-10-CM

## 2020-12-30 DIAGNOSIS — E538 Deficiency of other specified B group vitamins: Secondary | ICD-10-CM

## 2020-12-30 LAB — FECAL OCCULT BLOOD, IMMUNOCHEMICAL: Fecal Occult Bld: POSITIVE — AB

## 2020-12-30 NOTE — Addendum Note (Signed)
Addended by: Kelle Darting A on: 12/30/2020 08:56 AM   Modules accepted: Orders

## 2020-12-30 NOTE — Telephone Encounter (Signed)
Patient notified of results.  He agreed to injections, first injection scheduled for next Tuesday.  I need to know will it be ok to do lab work before 4th weekly injection or do you just want to do it the week after?

## 2020-12-30 NOTE — Telephone Encounter (Signed)
-----   Message from Mosie Lukes, MD sent at 12/29/2020  9:58 PM EDT ----- Notify his Vitamin B12 is normal but low normal. He would benefit from Vitamin B 12 shots. Arrange for him to get Vitamin B 12 shots weekly for 4 weeks and then repeat CBC, vitamin B 12 level and check an Intrinsic factor next month

## 2020-12-30 NOTE — Telephone Encounter (Signed)
Orders placed.

## 2021-01-02 NOTE — Progress Notes (Signed)
Hayden Garza is a 87 y.o. male presents to the office today for weekly B 12 injections, per physician's orders. Original order: Dr [name] 12/29/20 B12 1,000 mcg administered IM Left Deltoid (location) today. Patient tolerated injection. Patient due for follow up labs/provider appt: no.   Patient next injection due: 1 week, appt made Yes  [place], Kristine Garbe L

## 2021-01-03 ENCOUNTER — Other Ambulatory Visit: Payer: Self-pay

## 2021-01-03 ENCOUNTER — Other Ambulatory Visit (INDEPENDENT_AMBULATORY_CARE_PROVIDER_SITE_OTHER): Payer: Medicare HMO

## 2021-01-03 ENCOUNTER — Telehealth: Payer: Self-pay | Admitting: *Deleted

## 2021-01-03 ENCOUNTER — Ambulatory Visit (INDEPENDENT_AMBULATORY_CARE_PROVIDER_SITE_OTHER): Payer: Medicare HMO

## 2021-01-03 DIAGNOSIS — K921 Melena: Secondary | ICD-10-CM

## 2021-01-03 DIAGNOSIS — E538 Deficiency of other specified B group vitamins: Secondary | ICD-10-CM

## 2021-01-03 DIAGNOSIS — D649 Anemia, unspecified: Secondary | ICD-10-CM | POA: Diagnosis not present

## 2021-01-03 LAB — CBC WITH DIFFERENTIAL/PLATELET
Basophils Absolute: 0.1 10*3/uL (ref 0.0–0.1)
Basophils Relative: 0.8 % (ref 0.0–3.0)
Eosinophils Absolute: 0.2 10*3/uL (ref 0.0–0.7)
Eosinophils Relative: 2.9 % (ref 0.0–5.0)
HCT: 29.8 % — ABNORMAL LOW (ref 39.0–52.0)
Hemoglobin: 9.9 g/dL — ABNORMAL LOW (ref 13.0–17.0)
Lymphocytes Relative: 23.4 % (ref 12.0–46.0)
Lymphs Abs: 1.9 10*3/uL (ref 0.7–4.0)
MCHC: 33.1 g/dL (ref 30.0–36.0)
MCV: 96.1 fl (ref 78.0–100.0)
Monocytes Absolute: 0.7 10*3/uL (ref 0.1–1.0)
Monocytes Relative: 8.6 % (ref 3.0–12.0)
Neutro Abs: 5.3 10*3/uL (ref 1.4–7.7)
Neutrophils Relative %: 64.3 % (ref 43.0–77.0)
Platelets: 195 10*3/uL (ref 150.0–400.0)
RBC: 3.1 Mil/uL — ABNORMAL LOW (ref 4.22–5.81)
RDW: 14 % (ref 11.5–15.5)
WBC: 8.2 10*3/uL (ref 4.0–10.5)

## 2021-01-03 MED ORDER — CYANOCOBALAMIN 1000 MCG/ML IJ SOLN
1000.0000 ug | Freq: Once | INTRAMUSCULAR | Status: AC
  Administered 2021-01-03: 1000 ug via INTRAMUSCULAR

## 2021-01-03 NOTE — Telephone Encounter (Signed)
Pt called

## 2021-01-03 NOTE — Telephone Encounter (Signed)
Received call from Laurie at [facility] reporting positive IFOB. 

## 2021-01-04 LAB — IRON,TIBC AND FERRITIN PANEL
%SAT: 65 % (calc) — ABNORMAL HIGH (ref 20–48)
Ferritin: 22 ng/mL — ABNORMAL LOW (ref 24–380)
Iron: 188 ug/dL — ABNORMAL HIGH (ref 50–180)
TIBC: 289 mcg/dL (calc) (ref 250–425)

## 2021-01-09 DIAGNOSIS — Z86008 Personal history of in-situ neoplasm of other site: Secondary | ICD-10-CM | POA: Diagnosis not present

## 2021-01-09 DIAGNOSIS — Z85828 Personal history of other malignant neoplasm of skin: Secondary | ICD-10-CM | POA: Diagnosis not present

## 2021-01-09 DIAGNOSIS — L821 Other seborrheic keratosis: Secondary | ICD-10-CM | POA: Diagnosis not present

## 2021-01-09 DIAGNOSIS — L578 Other skin changes due to chronic exposure to nonionizing radiation: Secondary | ICD-10-CM | POA: Diagnosis not present

## 2021-01-09 DIAGNOSIS — D1801 Hemangioma of skin and subcutaneous tissue: Secondary | ICD-10-CM | POA: Diagnosis not present

## 2021-01-09 DIAGNOSIS — C4441 Basal cell carcinoma of skin of scalp and neck: Secondary | ICD-10-CM | POA: Diagnosis not present

## 2021-01-09 DIAGNOSIS — L570 Actinic keratosis: Secondary | ICD-10-CM | POA: Diagnosis not present

## 2021-01-10 ENCOUNTER — Other Ambulatory Visit: Payer: Self-pay

## 2021-01-10 ENCOUNTER — Ambulatory Visit (INDEPENDENT_AMBULATORY_CARE_PROVIDER_SITE_OTHER): Payer: Medicare HMO

## 2021-01-10 DIAGNOSIS — E538 Deficiency of other specified B group vitamins: Secondary | ICD-10-CM

## 2021-01-10 MED ORDER — CYANOCOBALAMIN 1000 MCG/ML IJ SOLN
1000.0000 ug | Freq: Once | INTRAMUSCULAR | Status: AC
  Administered 2021-01-10: 1000 ug via INTRAMUSCULAR

## 2021-01-10 NOTE — Progress Notes (Signed)
Hayden Garza is a 87 y.o. male presents to the office today for weekly b12 injection, per physician's orders. Original order: 7/22/22c cyanocobalamin (med), 1,000 mcg (dose),  IM (route) was administered right deltoid (location) today. Patient tolerated injection.  Routed to DOD in absence of PCP.   [name]

## 2021-01-11 ENCOUNTER — Other Ambulatory Visit (HOSPITAL_BASED_OUTPATIENT_CLINIC_OR_DEPARTMENT_OTHER): Payer: Self-pay

## 2021-01-12 ENCOUNTER — Other Ambulatory Visit (HOSPITAL_BASED_OUTPATIENT_CLINIC_OR_DEPARTMENT_OTHER): Payer: Self-pay

## 2021-01-17 ENCOUNTER — Ambulatory Visit (INDEPENDENT_AMBULATORY_CARE_PROVIDER_SITE_OTHER): Payer: Medicare HMO

## 2021-01-17 ENCOUNTER — Other Ambulatory Visit: Payer: Self-pay

## 2021-01-17 DIAGNOSIS — E538 Deficiency of other specified B group vitamins: Secondary | ICD-10-CM | POA: Diagnosis not present

## 2021-01-17 MED ORDER — CYANOCOBALAMIN 1000 MCG/ML IJ SOLN
1000.0000 ug | Freq: Once | INTRAMUSCULAR | Status: AC
  Administered 2021-01-17: 1000 ug via INTRAMUSCULAR

## 2021-01-17 NOTE — Progress Notes (Signed)
SEAGER SANDBERG is a 87 y.o. male presents to the office today for weekly b12 injection, per physician's orders. Original order: 01/03/2021 cyanocobalamin (med), 1,000 mcg (dose),  IM (route) was administered left deltoid (location) today. Patient tolerated injection. Patient due for follow up labs/provider appt: No. Patient next injection due: one week, appt made Yes  [name]

## 2021-01-20 ENCOUNTER — Telehealth: Payer: Self-pay | Admitting: Family Medicine

## 2021-01-20 NOTE — Telephone Encounter (Signed)
Fax too: [phone] [facility])     omeprazole (PRILOSEC) 40 MG capsule [id number]   doxazosin (CARDURA) 8 MG tablet [id number]   zolpidem (AMBIEN) 10 MG tablet [id number]  ENDED  Patient wife also requesting tramadol??? I didn't see that on the list though

## 2021-01-21 ENCOUNTER — Other Ambulatory Visit: Payer: Self-pay | Admitting: Family Medicine

## 2021-01-21 MED ORDER — DOXAZOSIN MESYLATE 8 MG PO TABS: 8.0000 mg | ORAL_TABLET | Freq: Every day | ORAL | 1 refills | Status: DC

## 2021-01-21 MED ORDER — OMEPRAZOLE 40 MG PO CPDR: 40.0000 mg | DELAYED_RELEASE_CAPSULE | Freq: Every day | ORAL | 1 refills | Status: DC

## 2021-01-21 MED ORDER — ZOLPIDEM TARTRATE 10 MG PO TABS: 10.0000 mg | ORAL_TABLET | Freq: Every evening | ORAL | 1 refills | Status: DC | PRN

## 2021-01-21 MED ORDER — TRAMADOL HCL 50 MG PO TABS: 50.0000 mg | ORAL_TABLET | Freq: Two times a day (BID) | ORAL | 0 refills | Status: DC | PRN

## 2021-01-23 ENCOUNTER — Telehealth: Payer: Self-pay | Admitting: *Deleted

## 2021-01-23 MED ORDER — DOXAZOSIN MESYLATE 8 MG PO TABS: 8.0000 mg | ORAL_TABLET | Freq: Every day | ORAL | 1 refills | Status: DC

## 2021-01-23 MED ORDER — ZOLPIDEM TARTRATE 10 MG PO TABS: 10.0000 mg | ORAL_TABLET | Freq: Every evening | ORAL | 1 refills | Status: DC | PRN

## 2021-01-23 MED ORDER — TRAMADOL HCL 50 MG PO TABS: 50.0000 mg | ORAL_TABLET | Freq: Two times a day (BID) | ORAL | 0 refills | Status: DC | PRN

## 2021-01-23 MED ORDER — OMEPRAZOLE 40 MG PO CPDR: 40.0000 mg | DELAYED_RELEASE_CAPSULE | Freq: Every day | ORAL | 1 refills | Status: DC

## 2021-01-23 NOTE — Telephone Encounter (Signed)
Did you fax the rx for tramadol to [facility]?  If so you can call to cancel at [facility]

## 2021-01-23 NOTE — Telephone Encounter (Signed)
Contact Type Call Who Is Calling Pharmacy Call Type Pharmacy Send to RN Chief Complaint Paging or Request for Consult Reason for Call Request to clarify medication order Initial Comment Hayden Garza, calling from [place] and she needs clarification on prescription directions. Pharmacy Name [facility] Pharmacist Name [place] Number [phone] Translation No Nurse Assessment Nurse: Toy Cookey, RN, Hayden Garza Date/Time Eilene Ghazi Time): 01/21/2021 12:16:08 PM Please select the assessment type ---Pharmacy clarification Additional Documentation ---[id number] [facility] Is there an on-call physician for the client? ---No Additional Documentation ---Tramadol was sent in e-scribe this morning for BID up to 5 days but was ordered for 90 day supply. So Pharmacy is needing to clarify quantity. I stated a note being sent to office and to f/u next business day.

## 2021-01-23 NOTE — Telephone Encounter (Signed)
[facility].  Rx was cancelled at [facility] for the tramadol.

## 2021-01-24 ENCOUNTER — Other Ambulatory Visit: Payer: Self-pay

## 2021-01-24 ENCOUNTER — Ambulatory Visit (INDEPENDENT_AMBULATORY_CARE_PROVIDER_SITE_OTHER): Payer: Medicare HMO

## 2021-01-24 DIAGNOSIS — E538 Deficiency of other specified B group vitamins: Secondary | ICD-10-CM | POA: Diagnosis not present

## 2021-01-24 MED ORDER — CYANOCOBALAMIN 1000 MCG/ML IJ SOLN
1000.0000 ug | Freq: Once | INTRAMUSCULAR | Status: AC
  Administered 2021-01-24: 1000 ug via INTRAMUSCULAR

## 2021-01-24 NOTE — Progress Notes (Signed)
Hayden Garza is a 87 y.o. male presents to the office today for weekly b12 injection per physician's orders.  cyanocobalamin (med), 1,000 mcg (dose),  IM (route) was administered right deltoid (location) today. Patient tolerated injection.  Date due: next month, appt made Yes  [name]

## 2021-02-23 ENCOUNTER — Other Ambulatory Visit: Payer: Self-pay | Admitting: Family Medicine

## 2021-02-24 ENCOUNTER — Ambulatory Visit (INDEPENDENT_AMBULATORY_CARE_PROVIDER_SITE_OTHER): Payer: Medicare HMO

## 2021-02-24 ENCOUNTER — Other Ambulatory Visit: Payer: Self-pay

## 2021-02-24 DIAGNOSIS — E538 Deficiency of other specified B group vitamins: Secondary | ICD-10-CM

## 2021-02-24 MED ORDER — CYANOCOBALAMIN 1000 MCG/ML IJ SOLN
1000.0000 ug | Freq: Once | INTRAMUSCULAR | Status: AC
  Administered 2021-02-24: 1000 ug via INTRAMUSCULAR

## 2021-02-24 MED ORDER — TRAMADOL HCL 50 MG PO TABS: 50.0000 mg | ORAL_TABLET | Freq: Two times a day (BID) | ORAL | 0 refills | Status: DC | PRN

## 2021-02-24 NOTE — Telephone Encounter (Signed)
Requesting: Tramadol Contract: none UDS: none Last Visit:01/10/2021 Next Visit:08/07/2021 Last Refill: 01/23/2021  Please Advise

## 2021-02-24 NOTE — Progress Notes (Signed)
Pt here for monthly B12 injection per Dr [name]  B12 1032mg given IM left deltoid, and pt tolerated injection well.  Next B12 injection scheduled for next month. Patient also needs lab work to check b12 serum level. I have scheduled lab appointment for that as well.

## 2021-03-27 ENCOUNTER — Other Ambulatory Visit: Payer: Self-pay | Admitting: Family Medicine

## 2021-03-28 ENCOUNTER — Other Ambulatory Visit: Payer: Medicare HMO

## 2021-03-28 ENCOUNTER — Ambulatory Visit: Payer: Medicare HMO

## 2021-03-29 ENCOUNTER — Other Ambulatory Visit (INDEPENDENT_AMBULATORY_CARE_PROVIDER_SITE_OTHER): Payer: Medicare HMO

## 2021-03-29 ENCOUNTER — Other Ambulatory Visit: Payer: Self-pay

## 2021-03-29 ENCOUNTER — Ambulatory Visit (INDEPENDENT_AMBULATORY_CARE_PROVIDER_SITE_OTHER): Payer: Medicare HMO

## 2021-03-29 DIAGNOSIS — E538 Deficiency of other specified B group vitamins: Secondary | ICD-10-CM

## 2021-03-29 DIAGNOSIS — D649 Anemia, unspecified: Secondary | ICD-10-CM

## 2021-03-29 DIAGNOSIS — Z23 Encounter for immunization: Secondary | ICD-10-CM

## 2021-03-29 LAB — CBC
HCT: 33.7 % — ABNORMAL LOW (ref 39.0–52.0)
Hemoglobin: 11.2 g/dL — ABNORMAL LOW (ref 13.0–17.0)
MCHC: 33.2 g/dL (ref 30.0–36.0)
MCV: 95.6 fl (ref 78.0–100.0)
Platelets: 195 10*3/uL (ref 150.0–400.0)
RBC: 3.52 Mil/uL — ABNORMAL LOW (ref 4.22–5.81)
RDW: 13.4 % (ref 11.5–15.5)
WBC: 10.5 10*3/uL (ref 4.0–10.5)

## 2021-03-29 LAB — VITAMIN B12: Vitamin B-12: 697 pg/mL (ref 211–911)

## 2021-03-29 MED ORDER — CYANOCOBALAMIN 1000 MCG/ML IJ SOLN
1000.0000 ug | Freq: Once | INTRAMUSCULAR | Status: AC
  Administered 2021-03-29: 1000 ug via INTRAMUSCULAR

## 2021-03-29 NOTE — Progress Notes (Signed)
Pt is here today for B12 injection and flu vaccine. Pt was given B12 injection in left deltoid and flu vaccine in right deltoid. Pt tolerated well

## 2021-03-31 LAB — INTRINSIC FACTOR ANTIBODIES: Intrinsic Factor: NEGATIVE

## 2021-04-14 ENCOUNTER — Telehealth: Payer: Self-pay | Admitting: Family Medicine

## 2021-04-14 NOTE — Telephone Encounter (Signed)
Left message for patient to call back and schedule Medicare Annual Wellness Visit (AWV) in office.  ° °If not able to come in office, please offer to do virtually or by telephone.  Left office number and my jabber #[phone]. ° °Due for AWVI ° °Please schedule at anytime with Nurse Health Advisor. °  °

## 2021-04-19 ENCOUNTER — Ambulatory Visit: Payer: Medicare HMO

## 2021-04-19 ENCOUNTER — Other Ambulatory Visit (HOSPITAL_COMMUNITY)
Admission: RE | Admit: 2021-04-19 | Discharge: 2021-04-19 | Disposition: A | Discharge status: Home / Self Care | Payer: Medicare HMO | Attending: Orthopedic Surgery | Admitting: Orthopedic Surgery

## 2021-04-19 DIAGNOSIS — Z888 Allergy status to other drugs, medicaments and biological substances status: Secondary | ICD-10-CM | POA: Diagnosis not present

## 2021-04-19 DIAGNOSIS — M009 Pyogenic arthritis, unspecified: Secondary | ICD-10-CM | POA: Diagnosis not present

## 2021-04-19 DIAGNOSIS — N401 Enlarged prostate with lower urinary tract symptoms: Secondary | ICD-10-CM | POA: Diagnosis present

## 2021-04-19 DIAGNOSIS — Z7982 Long term (current) use of aspirin: Secondary | ICD-10-CM | POA: Diagnosis not present

## 2021-04-19 DIAGNOSIS — Z87442 Personal history of urinary calculi: Secondary | ICD-10-CM | POA: Diagnosis not present

## 2021-04-19 DIAGNOSIS — I2510 Atherosclerotic heart disease of native coronary artery without angina pectoris: Secondary | ICD-10-CM | POA: Diagnosis present

## 2021-04-19 DIAGNOSIS — E119 Type 2 diabetes mellitus without complications: Secondary | ICD-10-CM | POA: Diagnosis present

## 2021-04-19 DIAGNOSIS — K219 Gastro-esophageal reflux disease without esophagitis: Secondary | ICD-10-CM | POA: Diagnosis present

## 2021-04-19 DIAGNOSIS — G8918 Other acute postprocedural pain: Secondary | ICD-10-CM | POA: Diagnosis not present

## 2021-04-19 DIAGNOSIS — Z8249 Family history of ischemic heart disease and other diseases of the circulatory system: Secondary | ICD-10-CM | POA: Diagnosis not present

## 2021-04-19 DIAGNOSIS — Z20822 Contact with and (suspected) exposure to covid-19: Secondary | ICD-10-CM | POA: Diagnosis present

## 2021-04-19 DIAGNOSIS — Z880 Allergy status to penicillin: Secondary | ICD-10-CM | POA: Diagnosis not present

## 2021-04-19 DIAGNOSIS — M25511 Pain in right shoulder: Secondary | ICD-10-CM | POA: Diagnosis not present

## 2021-04-19 DIAGNOSIS — M00811 Arthritis due to other bacteria, right shoulder: Secondary | ICD-10-CM | POA: Diagnosis present

## 2021-04-19 DIAGNOSIS — Z96611 Presence of right artificial shoulder joint: Secondary | ICD-10-CM | POA: Diagnosis not present

## 2021-04-19 DIAGNOSIS — Y793 Surgical instruments, materials and orthopedic devices (including sutures) associated with adverse incidents: Secondary | ICD-10-CM | POA: Diagnosis present

## 2021-04-19 DIAGNOSIS — Z83438 Family history of other disorder of lipoprotein metabolism and other lipidemia: Secondary | ICD-10-CM | POA: Diagnosis not present

## 2021-04-19 DIAGNOSIS — T8459XA Infection and inflammatory reaction due to other internal joint prosthesis, initial encounter: Secondary | ICD-10-CM | POA: Insufficient documentation

## 2021-04-19 DIAGNOSIS — Z79899 Other long term (current) drug therapy: Secondary | ICD-10-CM | POA: Diagnosis not present

## 2021-04-19 DIAGNOSIS — Z7984 Long term (current) use of oral hypoglycemic drugs: Secondary | ICD-10-CM | POA: Diagnosis not present

## 2021-04-19 DIAGNOSIS — E782 Mixed hyperlipidemia: Secondary | ICD-10-CM | POA: Diagnosis present

## 2021-04-19 DIAGNOSIS — Z841 Family history of disorders of kidney and ureter: Secondary | ICD-10-CM | POA: Diagnosis not present

## 2021-04-19 DIAGNOSIS — I10 Essential (primary) hypertension: Secondary | ICD-10-CM | POA: Diagnosis present

## 2021-04-19 DIAGNOSIS — Z833 Family history of diabetes mellitus: Secondary | ICD-10-CM | POA: Diagnosis not present

## 2021-04-19 DIAGNOSIS — N138 Other obstructive and reflux uropathy: Secondary | ICD-10-CM | POA: Diagnosis present

## 2021-04-19 DIAGNOSIS — Z85828 Personal history of other malignant neoplasm of skin: Secondary | ICD-10-CM | POA: Diagnosis not present

## 2021-04-19 LAB — SYNOVIAL CELL COUNT + DIFF, W/ CRYSTALS
Crystals, Fluid: NONE SEEN
Eosinophils-Synovial: 0 % (ref 0–1)
Lymphocytes-Synovial Fld: 2 % (ref 0–20)
Monocyte-Macrophage-Synovial Fluid: 7 % — ABNORMAL LOW (ref 50–90)
Neutrophil, Synovial: 91 % — ABNORMAL HIGH (ref 0–25)
WBC, Synovial: 91530 /mm3 — ABNORMAL HIGH (ref 0–200)

## 2021-04-20 ENCOUNTER — Inpatient Hospital Stay (HOSPITAL_COMMUNITY): Payer: Medicare HMO | Admitting: Certified Registered Nurse Anesthetist

## 2021-04-20 ENCOUNTER — Inpatient Hospital Stay (HOSPITAL_COMMUNITY)
Admission: RE | Admit: 2021-04-20 | Discharge: 2021-04-24 | DRG: 496 | Disposition: A | Discharge status: Home Health | Payer: Medicare HMO | Attending: Orthopedic Surgery | Admitting: Orthopedic Surgery

## 2021-04-20 ENCOUNTER — Encounter (HOSPITAL_COMMUNITY)
Admission: RE | Disposition: A | Discharge status: Home / Self Care | Payer: Self-pay | Source: Home / Self Care | Attending: Orthopedic Surgery

## 2021-04-20 ENCOUNTER — Inpatient Hospital Stay: Payer: Self-pay

## 2021-04-20 ENCOUNTER — Encounter (HOSPITAL_COMMUNITY): Payer: Self-pay | Admitting: Orthopedic Surgery

## 2021-04-20 ENCOUNTER — Other Ambulatory Visit: Payer: Self-pay

## 2021-04-20 DIAGNOSIS — N138 Other obstructive and reflux uropathy: Secondary | ICD-10-CM | POA: Diagnosis present

## 2021-04-20 DIAGNOSIS — Z83438 Family history of other disorder of lipoprotein metabolism and other lipidemia: Secondary | ICD-10-CM

## 2021-04-20 DIAGNOSIS — Z20822 Contact with and (suspected) exposure to covid-19: Secondary | ICD-10-CM | POA: Diagnosis present

## 2021-04-20 DIAGNOSIS — Z888 Allergy status to other drugs, medicaments and biological substances status: Secondary | ICD-10-CM | POA: Diagnosis not present

## 2021-04-20 DIAGNOSIS — K219 Gastro-esophageal reflux disease without esophagitis: Secondary | ICD-10-CM | POA: Diagnosis present

## 2021-04-20 DIAGNOSIS — Z7984 Long term (current) use of oral hypoglycemic drugs: Secondary | ICD-10-CM

## 2021-04-20 DIAGNOSIS — M00811 Arthritis due to other bacteria, right shoulder: Secondary | ICD-10-CM | POA: Diagnosis present

## 2021-04-20 DIAGNOSIS — Z8249 Family history of ischemic heart disease and other diseases of the circulatory system: Secondary | ICD-10-CM

## 2021-04-20 DIAGNOSIS — N401 Enlarged prostate with lower urinary tract symptoms: Secondary | ICD-10-CM | POA: Diagnosis present

## 2021-04-20 DIAGNOSIS — I10 Essential (primary) hypertension: Secondary | ICD-10-CM | POA: Diagnosis present

## 2021-04-20 DIAGNOSIS — E782 Mixed hyperlipidemia: Secondary | ICD-10-CM | POA: Diagnosis present

## 2021-04-20 DIAGNOSIS — Z880 Allergy status to penicillin: Secondary | ICD-10-CM | POA: Diagnosis not present

## 2021-04-20 DIAGNOSIS — Y793 Surgical instruments, materials and orthopedic devices (including sutures) associated with adverse incidents: Secondary | ICD-10-CM | POA: Diagnosis present

## 2021-04-20 DIAGNOSIS — I2510 Atherosclerotic heart disease of native coronary artery without angina pectoris: Secondary | ICD-10-CM | POA: Diagnosis present

## 2021-04-20 DIAGNOSIS — Z79899 Other long term (current) drug therapy: Secondary | ICD-10-CM | POA: Diagnosis not present

## 2021-04-20 DIAGNOSIS — Z7982 Long term (current) use of aspirin: Secondary | ICD-10-CM

## 2021-04-20 DIAGNOSIS — M009 Pyogenic arthritis, unspecified: Secondary | ICD-10-CM | POA: Diagnosis not present

## 2021-04-20 DIAGNOSIS — E119 Type 2 diabetes mellitus without complications: Secondary | ICD-10-CM | POA: Diagnosis present

## 2021-04-20 DIAGNOSIS — Z87442 Personal history of urinary calculi: Secondary | ICD-10-CM | POA: Diagnosis not present

## 2021-04-20 DIAGNOSIS — Z85828 Personal history of other malignant neoplasm of skin: Secondary | ICD-10-CM

## 2021-04-20 DIAGNOSIS — Z833 Family history of diabetes mellitus: Secondary | ICD-10-CM

## 2021-04-20 DIAGNOSIS — T8459XA Infection and inflammatory reaction due to other internal joint prosthesis, initial encounter: Principal | ICD-10-CM | POA: Diagnosis present

## 2021-04-20 DIAGNOSIS — Z841 Family history of disorders of kidney and ureter: Secondary | ICD-10-CM | POA: Diagnosis not present

## 2021-04-20 DIAGNOSIS — Z01818 Encounter for other preprocedural examination: Secondary | ICD-10-CM

## 2021-04-20 HISTORY — PX: TOTAL SHOULDER REVISION: SHX6130

## 2021-04-20 LAB — BASIC METABOLIC PANEL
Anion gap: 10 (ref 5–15)
BUN: 27 mg/dL — ABNORMAL HIGH (ref 8–23)
CO2: 22 mmol/L (ref 22–32)
Calcium: 9.4 mg/dL (ref 8.9–10.3)
Chloride: 103 mmol/L (ref 98–111)
Creatinine, Ser: 1.72 mg/dL — ABNORMAL HIGH (ref 0.61–1.24)
GFR, Estimated: 38 mL/min — ABNORMAL LOW (ref >60)
Glucose, Bld: 252 mg/dL — ABNORMAL HIGH (ref 70–99)
Potassium: 4.7 mmol/L (ref 3.5–5.1)
Sodium: 135 mmol/L (ref 135–145)

## 2021-04-20 LAB — GLUCOSE, CAPILLARY
Glucose-Capillary: 258 mg/dL — ABNORMAL HIGH (ref 70–99)
Glucose-Capillary: 282 mg/dL — ABNORMAL HIGH (ref 70–99)

## 2021-04-20 LAB — CBC
HCT: 35.8 % — ABNORMAL LOW (ref 39.0–52.0)
Hemoglobin: 11.2 g/dL — ABNORMAL LOW (ref 13.0–17.0)
MCH: 31.6 pg (ref 26.0–34.0)
MCHC: 31.3 g/dL (ref 30.0–36.0)
MCV: 101.1 fL — ABNORMAL HIGH (ref 80.0–100.0)
Platelets: 226 10*3/uL (ref 150–400)
RBC: 3.54 MIL/uL — ABNORMAL LOW (ref 4.22–5.81)
RDW: 12.9 % (ref 11.5–15.5)
WBC: 9.9 10*3/uL (ref 4.0–10.5)
nRBC: 0 % (ref 0.0–0.2)

## 2021-04-20 LAB — SARS CORONAVIRUS 2 BY RT PCR (HOSPITAL ORDER, PERFORMED IN ~~LOC~~ HOSPITAL LAB): SARS Coronavirus 2: NEGATIVE

## 2021-04-20 SURGERY — REVISION, TOTAL ARTHROPLASTY, SHOULDER
Anesthesia: General | Site: Shoulder | Laterality: Right

## 2021-04-20 MED ORDER — STERILE WATER FOR IRRIGATION IR SOLN
Status: DC | PRN
  Administered 2021-04-20: 1000 mL

## 2021-04-20 MED ORDER — LISINOPRIL 5 MG PO TABS
2.5000 mg | ORAL_TABLET | Freq: Every day | ORAL | Status: DC
  Administered 2021-04-21 – 2021-04-24 (×4): 2.5 mg via ORAL
  Filled 2021-04-20 (×4): qty 1

## 2021-04-20 MED ORDER — BISACODYL 5 MG PO TBEC
5.0000 mg | DELAYED_RELEASE_TABLET | Freq: Every day | ORAL | Status: DC | PRN
  Administered 2021-04-24: 5 mg via ORAL
  Filled 2021-04-20: qty 1

## 2021-04-20 MED ORDER — METHOCARBAMOL 1000 MG/10ML IJ SOLN
500.0000 mg | Freq: Four times a day (QID) | INTRAVENOUS | Status: DC | PRN
  Filled 2021-04-20: qty 5

## 2021-04-20 MED ORDER — SUGAMMADEX SODIUM 200 MG/2ML IV SOLN
INTRAVENOUS | Status: DC | PRN
  Administered 2021-04-20: 200 mg via INTRAVENOUS

## 2021-04-20 MED ORDER — PHENYLEPHRINE 40 MCG/ML (10ML) SYRINGE FOR IV PUSH (FOR BLOOD PRESSURE SUPPORT)
PREFILLED_SYRINGE | INTRAVENOUS | Status: AC
  Filled 2021-04-20: qty 10

## 2021-04-20 MED ORDER — DIPHENHYDRAMINE HCL 12.5 MG/5ML PO ELIX: 12.5000 mg | ORAL_SOLUTION | ORAL | Status: DC | PRN

## 2021-04-20 MED ORDER — 0.9 % SODIUM CHLORIDE (POUR BTL) OPTIME
TOPICAL | Status: DC | PRN
  Administered 2021-04-20: 1000 mL

## 2021-04-20 MED ORDER — SODIUM CHLORIDE 0.9 % IV SOLN
2.0000 g | INTRAVENOUS | Status: DC
  Administered 2021-04-21 – 2021-04-24 (×4): 2 g via INTRAVENOUS
  Filled 2021-04-20 (×4): qty 20

## 2021-04-20 MED ORDER — FINASTERIDE 5 MG PO TABS
5.0000 mg | ORAL_TABLET | Freq: Every day | ORAL | Status: DC
  Administered 2021-04-21 – 2021-04-24 (×4): 5 mg via ORAL
  Filled 2021-04-20 (×4): qty 1

## 2021-04-20 MED ORDER — ONDANSETRON HCL 4 MG/2ML IJ SOLN: 4.0000 mg | Freq: Four times a day (QID) | INTRAMUSCULAR | Status: DC | PRN

## 2021-04-20 MED ORDER — VANCOMYCIN HCL 1000 MG IV SOLR
INTRAVENOUS | Status: DC | PRN
  Administered 2021-04-20: 1000 mg via INTRAVENOUS

## 2021-04-20 MED ORDER — INSULIN ASPART 100 UNIT/ML IJ SOLN
INTRAMUSCULAR | Status: AC
  Administered 2021-04-20: 4 [IU] via SUBCUTANEOUS
  Filled 2021-04-20: qty 1

## 2021-04-20 MED ORDER — ALBUMIN HUMAN 5 % IV SOLN
INTRAVENOUS | Status: AC
  Administered 2021-04-20: 12.5 g via INTRAVENOUS
  Filled 2021-04-20: qty 250

## 2021-04-20 MED ORDER — FENTANYL CITRATE PF 50 MCG/ML IJ SOSY
PREFILLED_SYRINGE | INTRAMUSCULAR | Status: AC
  Administered 2021-04-20: 50 ug
  Filled 2021-04-20: qty 2

## 2021-04-20 MED ORDER — PROPOFOL 10 MG/ML IV BOLUS
INTRAVENOUS | Status: DC | PRN
  Administered 2021-04-20: 30 mg via INTRAVENOUS
  Administered 2021-04-20: 20 mg via INTRAVENOUS
  Administered 2021-04-20: 80 mg via INTRAVENOUS

## 2021-04-20 MED ORDER — INSULIN ASPART 100 UNIT/ML IJ SOLN
INTRAMUSCULAR | Status: AC
  Filled 2021-04-20: qty 1

## 2021-04-20 MED ORDER — ZOLPIDEM TARTRATE 5 MG PO TABS
5.0000 mg | ORAL_TABLET | Freq: Every evening | ORAL | Status: DC | PRN
  Administered 2021-04-21 – 2021-04-23 (×4): 5 mg via ORAL
  Filled 2021-04-20 (×4): qty 1

## 2021-04-20 MED ORDER — OXYCODONE HCL 5 MG PO TABS
10.0000 mg | ORAL_TABLET | ORAL | Status: DC | PRN
  Administered 2021-04-21 – 2021-04-23 (×3): 10 mg via ORAL
  Filled 2021-04-20 (×3): qty 2

## 2021-04-20 MED ORDER — VANCOMYCIN HCL 1000 MG IV SOLR
INTRAVENOUS | Status: DC | PRN
  Administered 2021-04-20: 1000 mg via TOPICAL

## 2021-04-20 MED ORDER — ASPIRIN EC 81 MG PO TBEC
81.0000 mg | DELAYED_RELEASE_TABLET | Freq: Every day | ORAL | Status: DC
  Administered 2021-04-21 – 2021-04-24 (×4): 81 mg via ORAL
  Filled 2021-04-20 (×4): qty 1

## 2021-04-20 MED ORDER — DOCUSATE SODIUM 100 MG PO CAPS
100.0000 mg | ORAL_CAPSULE | Freq: Two times a day (BID) | ORAL | Status: DC
  Administered 2021-04-21 – 2021-04-24 (×7): 100 mg via ORAL
  Filled 2021-04-20 (×7): qty 1

## 2021-04-20 MED ORDER — NITROGLYCERIN 0.3 MG SL SUBL: 0.3000 mg | SUBLINGUAL_TABLET | SUBLINGUAL | Status: DC | PRN

## 2021-04-20 MED ORDER — ONDANSETRON HCL 4 MG/2ML IJ SOLN
INTRAMUSCULAR | Status: DC | PRN
  Administered 2021-04-20: 4 mg via INTRAVENOUS

## 2021-04-20 MED ORDER — TRANEXAMIC ACID 1000 MG/10ML IV SOLN
2000.0000 mg | Freq: Once | INTRAVENOUS | Status: DC
  Filled 2021-04-20: qty 20

## 2021-04-20 MED ORDER — EPHEDRINE 5 MG/ML INJ
INTRAVENOUS | Status: AC
  Filled 2021-04-20: qty 20

## 2021-04-20 MED ORDER — INSULIN ASPART 100 UNIT/ML IJ SOLN: 4.0000 [IU] | Freq: Once | INTRAMUSCULAR | Status: AC

## 2021-04-20 MED ORDER — LACTATED RINGERS IV SOLN: INTRAVENOUS | Status: DC | PRN

## 2021-04-20 MED ORDER — VANCOMYCIN HCL 1000 MG IV SOLR
INTRAVENOUS | Status: AC
  Filled 2021-04-20: qty 20

## 2021-04-20 MED ORDER — METHOCARBAMOL 500 MG PO TABS
500.0000 mg | ORAL_TABLET | Freq: Four times a day (QID) | ORAL | Status: DC | PRN
  Administered 2021-04-21 – 2021-04-23 (×3): 500 mg via ORAL
  Filled 2021-04-20 (×3): qty 1

## 2021-04-20 MED ORDER — TRANEXAMIC ACID-NACL 1000-0.7 MG/100ML-% IV SOLN
1000.0000 mg | INTRAVENOUS | Status: AC
  Administered 2021-04-20: 1000 mg via INTRAVENOUS
  Filled 2021-04-20: qty 100

## 2021-04-20 MED ORDER — ROCURONIUM BROMIDE 10 MG/ML (PF) SYRINGE
PREFILLED_SYRINGE | INTRAVENOUS | Status: DC | PRN
Start: 2021-04-20 — End: 2021-04-20
  Administered 2021-04-20: 50 mg via INTRAVENOUS

## 2021-04-20 MED ORDER — SODIUM CHLORIDE 0.9 % IV SOLN
250.0000 mL | INTRAVENOUS | Status: DC
  Administered 2021-04-22 – 2021-04-23 (×2): 250 mL via INTRAVENOUS

## 2021-04-20 MED ORDER — VANCOMYCIN HCL IN DEXTROSE 1-5 GM/200ML-% IV SOLN
INTRAVENOUS | Status: AC
  Filled 2021-04-20: qty 200

## 2021-04-20 MED ORDER — SODIUM CHLORIDE 0.9 % IV SOLN
INTRAVENOUS | Status: AC
  Filled 2021-04-20: qty 20

## 2021-04-20 MED ORDER — EPHEDRINE SULFATE-NACL 50-0.9 MG/10ML-% IV SOSY
PREFILLED_SYRINGE | INTRAVENOUS | Status: DC | PRN
  Administered 2021-04-20 (×2): 5 mg via INTRAVENOUS
  Administered 2021-04-20 (×3): 10 mg via INTRAVENOUS
  Administered 2021-04-20 (×6): 5 mg via INTRAVENOUS

## 2021-04-20 MED ORDER — ALBUMIN HUMAN 5 % IV SOLN
INTRAVENOUS | Status: AC
  Filled 2021-04-20: qty 250

## 2021-04-20 MED ORDER — ACETAMINOPHEN 500 MG PO TABS
1000.0000 mg | ORAL_TABLET | Freq: Once | ORAL | Status: AC
  Administered 2021-04-20: 1000 mg via ORAL

## 2021-04-20 MED ORDER — ONDANSETRON HCL 4 MG PO TABS: 4.0000 mg | ORAL_TABLET | Freq: Four times a day (QID) | ORAL | Status: DC | PRN

## 2021-04-20 MED ORDER — OXYCODONE HCL 5 MG PO TABS
5.0000 mg | ORAL_TABLET | ORAL | Status: DC | PRN
  Administered 2021-04-21: 5 mg via ORAL
  Filled 2021-04-20 (×2): qty 1

## 2021-04-20 MED ORDER — INSULIN ASPART 100 UNIT/ML IJ SOLN
0.0000 [IU] | Freq: Three times a day (TID) | INTRAMUSCULAR | Status: DC
Start: 2021-04-21 — End: 2021-04-24
  Administered 2021-04-21: 11 [IU] via SUBCUTANEOUS
  Administered 2021-04-21 (×2): 5 [IU] via SUBCUTANEOUS
  Administered 2021-04-22 (×2): 8 [IU] via SUBCUTANEOUS
  Administered 2021-04-22: 3 [IU] via SUBCUTANEOUS
  Administered 2021-04-23 (×2): 11 [IU] via SUBCUTANEOUS
  Administered 2021-04-23: 2 [IU] via SUBCUTANEOUS
  Administered 2021-04-24: 8 [IU] via SUBCUTANEOUS
  Administered 2021-04-24: 3 [IU] via SUBCUTANEOUS

## 2021-04-20 MED ORDER — MENTHOL 3 MG MT LOZG: 1.0000 | LOZENGE | OROMUCOSAL | Status: DC | PRN

## 2021-04-20 MED ORDER — DEXAMETHASONE SODIUM PHOSPHATE 10 MG/ML IJ SOLN
INTRAMUSCULAR | Status: DC | PRN
  Administered 2021-04-20: 4 mg via INTRAVENOUS

## 2021-04-20 MED ORDER — HYDROMORPHONE HCL 1 MG/ML IJ SOLN
0.5000 mg | INTRAMUSCULAR | Status: DC | PRN
Start: 2021-04-20 — End: 2021-04-24

## 2021-04-20 MED ORDER — FENTANYL CITRATE (PF) 100 MCG/2ML IJ SOLN
INTRAMUSCULAR | Status: AC
  Filled 2021-04-20: qty 2

## 2021-04-20 MED ORDER — LIDOCAINE HCL (PF) 2 % IJ SOLN
INTRAMUSCULAR | Status: DC | PRN
  Administered 2021-04-20: 40 mg via INTRADERMAL
  Administered 2021-04-20: 60 mg via INTRADERMAL

## 2021-04-20 MED ORDER — BUPIVACAINE-EPINEPHRINE (PF) 0.5% -1:200000 IJ SOLN
INTRAMUSCULAR | Status: DC | PRN
Start: 2021-04-20 — End: 2021-04-20
  Administered 2021-04-20: 15 mL via PERINEURAL

## 2021-04-20 MED ORDER — FENTANYL CITRATE (PF) 100 MCG/2ML IJ SOLN
INTRAMUSCULAR | Status: DC | PRN
  Administered 2021-04-20 (×2): 25 ug via INTRAVENOUS

## 2021-04-20 MED ORDER — FENTANYL CITRATE PF 50 MCG/ML IJ SOSY: 25.0000 ug | PREFILLED_SYRINGE | INTRAMUSCULAR | Status: DC | PRN

## 2021-04-20 MED ORDER — CEFTRIAXONE SODIUM 2 G IJ SOLR
INTRAMUSCULAR | Status: DC | PRN
  Administered 2021-04-20: 2 g via INTRAVENOUS

## 2021-04-20 MED ORDER — BUPIVACAINE LIPOSOME 1.3 % IJ SUSP
INTRAMUSCULAR | Status: DC | PRN
  Administered 2021-04-20: 10 mL via PERINEURAL

## 2021-04-20 MED ORDER — PHENYLEPHRINE HCL-NACL 20-0.9 MG/250ML-% IV SOLN
INTRAVENOUS | Status: DC | PRN
  Administered 2021-04-20: 20 ug/min via INTRAVENOUS

## 2021-04-20 MED ORDER — DOXAZOSIN MESYLATE 8 MG PO TABS
8.0000 mg | ORAL_TABLET | Freq: Every day | ORAL | Status: DC
  Administered 2021-04-21 – 2021-04-23 (×3): 8 mg via ORAL
  Filled 2021-04-20 (×5): qty 1

## 2021-04-20 MED ORDER — POLYETHYLENE GLYCOL 3350 17 G PO PACK
17.0000 g | PACK | Freq: Every day | ORAL | Status: DC | PRN
  Administered 2021-04-23 – 2021-04-24 (×2): 17 g via ORAL
  Filled 2021-04-20 (×2): qty 1

## 2021-04-20 MED ORDER — ALBUMIN HUMAN 5 % IV SOLN: 12.5000 g | Freq: Once | INTRAVENOUS | Status: AC

## 2021-04-20 MED ORDER — PHENYLEPHRINE HCL-NACL 20-0.9 MG/250ML-% IV SOLN
25.0000 ug/min | INTRAVENOUS | Status: DC
  Administered 2021-04-20: 10 ug/min via INTRAVENOUS
  Administered 2021-04-20: 20 ug/min via INTRAVENOUS
  Administered 2021-04-20: 18 ug/min via INTRAVENOUS
  Administered 2021-04-20: 5 ug/min via INTRAVENOUS

## 2021-04-20 MED ORDER — PHENOL 1.4 % MT LIQD: 1.0000 | OROMUCOSAL | Status: DC | PRN

## 2021-04-20 MED ORDER — METOCLOPRAMIDE HCL 5 MG PO TABS: 5.0000 mg | ORAL_TABLET | Freq: Three times a day (TID) | ORAL | Status: DC | PRN

## 2021-04-20 MED ORDER — ACETAMINOPHEN 500 MG PO TABS
ORAL_TABLET | ORAL | Status: AC
  Filled 2021-04-20: qty 2

## 2021-04-20 MED ORDER — METOCLOPRAMIDE HCL 5 MG/ML IJ SOLN: 5.0000 mg | Freq: Three times a day (TID) | INTRAMUSCULAR | Status: DC | PRN

## 2021-04-20 MED ORDER — SODIUM CHLORIDE 0.9 % IR SOLN
Status: DC | PRN
  Administered 2021-04-20: 3000 mL

## 2021-04-20 MED ORDER — ROCURONIUM BROMIDE 10 MG/ML (PF) SYRINGE
PREFILLED_SYRINGE | INTRAVENOUS | Status: AC
  Filled 2021-04-20: qty 10

## 2021-04-20 MED ORDER — LIDOCAINE HCL (PF) 2 % IJ SOLN
INTRAMUSCULAR | Status: AC
  Filled 2021-04-20: qty 25

## 2021-04-20 MED ORDER — SODIUM CHLORIDE (PF) 0.9 % IJ SOLN
INTRAMUSCULAR | Status: AC
  Filled 2021-04-20: qty 20

## 2021-04-20 MED ORDER — ACETAMINOPHEN 325 MG PO TABS
325.0000 mg | ORAL_TABLET | Freq: Four times a day (QID) | ORAL | Status: DC | PRN
  Administered 2021-04-21 – 2021-04-23 (×3): 650 mg via ORAL
  Filled 2021-04-20 (×3): qty 2

## 2021-04-20 MED ORDER — METOPROLOL TARTRATE 25 MG PO TABS
25.0000 mg | ORAL_TABLET | Freq: Every day | ORAL | Status: DC
  Administered 2021-04-21 – 2021-04-24 (×4): 25 mg via ORAL
  Filled 2021-04-20 (×4): qty 1

## 2021-04-20 MED ORDER — LACTATED RINGERS IV SOLN: INTRAVENOUS | Status: DC

## 2021-04-20 SURGICAL SUPPLY — 75 items
ADH SKN CLS APL DERMABOND .7 (GAUZE/BANDAGES/DRESSINGS) ×1
AID PSTN UNV HD RSTRNT DISP (MISCELLANEOUS) ×1
BAG COUNTER SPONGE SURGICOUNT (BAG) ×1 IMPLANT
BAG SPEC THK2 15X12 ZIP CLS (MISCELLANEOUS) ×1
BAG SPNG CNTER NS LX DISP (BAG) ×1
BAG SURGICOUNT SPONGE COUNTING (BAG) ×1
BAG ZIPLOCK 12X15 (MISCELLANEOUS) ×3 IMPLANT
BLADE SAW SGTL 83.5X18.5 (BLADE) IMPLANT
BOWL SMART MIX CTS (DISPOSABLE) ×2 IMPLANT
CEMENT GENTAMICIN CEMEX 40G (Cement) ×2 IMPLANT
CNTNR URN SCR LID CUP LEK RST (MISCELLANEOUS) IMPLANT
CONT SPEC 4OZ STRL OR WHT (MISCELLANEOUS) ×3
COOLER ICEMAN CLASSIC (MISCELLANEOUS) IMPLANT
COVER BACK TABLE 60X90IN (DRAPES) ×3 IMPLANT
COVER SURGICAL LIGHT HANDLE (MISCELLANEOUS) ×3 IMPLANT
DERMABOND ADVANCED (GAUZE/BANDAGES/DRESSINGS) ×2
DERMABOND ADVANCED .7 DNX12 (GAUZE/BANDAGES/DRESSINGS) ×1 IMPLANT
DRAPE INCISE 23X17 IOBAN STRL (DRAPES) ×2
DRAPE INCISE 23X17 STRL (DRAPES) IMPLANT
DRAPE INCISE IOBAN 23X17 STRL (DRAPES) ×1 IMPLANT
DRAPE INCISE IOBAN 66X45 STRL (DRAPES) IMPLANT
DRAPE ORTHO SPLIT 77X108 STRL (DRAPES) ×6
DRAPE SHEET LG 3/4 BI-LAMINATE (DRAPES) ×3 IMPLANT
DRAPE SURG 17X11 SM STRL (DRAPES) ×3 IMPLANT
DRAPE SURG ORHT 6 SPLT 77X108 (DRAPES) ×2 IMPLANT
DRAPE U-SHAPE 47X51 STRL (DRAPES) ×3 IMPLANT
DRESSING PREVENA PLUS CUSTOM (GAUZE/BANDAGES/DRESSINGS) IMPLANT
DRSG AQUACEL AG ADV 3.5X10 (GAUZE/BANDAGES/DRESSINGS) ×3 IMPLANT
DRSG PREVENA PLUS CUSTOM (GAUZE/BANDAGES/DRESSINGS) ×3
DURAPREP 26ML APPLICATOR (WOUND CARE) ×3 IMPLANT
ELECT BLADE TIP CTD 4 INCH (ELECTRODE) ×3 IMPLANT
ELECT REM PT RETURN 15FT ADLT (MISCELLANEOUS) ×3 IMPLANT
FACESHIELD WRAPAROUND (MASK) ×15 IMPLANT
FACESHIELD WRAPAROUND OR TEAM (MASK) ×3 IMPLANT
GLOVE SRG 8 PF TXTR STRL LF DI (GLOVE) ×1 IMPLANT
GLOVE SURG ENC MOIS LTX SZ7 (GLOVE) ×3 IMPLANT
GLOVE SURG ENC MOIS LTX SZ7.5 (GLOVE) ×3 IMPLANT
GLOVE SURG UNDER POLY LF SZ7 (GLOVE) ×3 IMPLANT
GLOVE SURG UNDER POLY LF SZ8 (GLOVE) ×3
GOWN STRL REUS W/TWL LRG LVL3 (GOWN DISPOSABLE) ×6 IMPLANT
HANDPIECE INTERPULSE COAX TIP (DISPOSABLE) ×3
KIT BASIN OR (CUSTOM PROCEDURE TRAY) ×3 IMPLANT
KIT DRSG PREVENA PLUS 7DAY 125 (MISCELLANEOUS) IMPLANT
KIT SHOULDER SPACER 11MM STEM (Shoulder) ×2 IMPLANT
KIT TURNOVER KIT A (KITS) ×2 IMPLANT
MANIFOLD NEPTUNE II (INSTRUMENTS) ×3 IMPLANT
NDL TAPERED W/ NITINOL LOOP (MISCELLANEOUS) IMPLANT
NEEDLE TAPERED W/ NITINOL LOOP (MISCELLANEOUS) IMPLANT
PACK SHOULDER (CUSTOM PROCEDURE TRAY) ×3 IMPLANT
PAD ARMBOARD 7.5X6 YLW CONV (MISCELLANEOUS) ×6 IMPLANT
PAD COLD SHLDR WRAP-ON (PAD) IMPLANT
PROTECTOR NERVE ULNAR (MISCELLANEOUS) ×3 IMPLANT
RESTRAINT HEAD UNIVERSAL NS (MISCELLANEOUS) ×3 IMPLANT
SET HNDPC FAN SPRY TIP SCT (DISPOSABLE) IMPLANT
SLING ARM FOAM STRAP LRG (SOFTGOODS) ×2 IMPLANT
SLING ARM FOAM STRAP MED (SOFTGOODS) IMPLANT
SMARTMIX MINI TOWER (MISCELLANEOUS)
SPONGE T-LAP 18X18 ~~LOC~~+RFID (SPONGE) ×4 IMPLANT
SUCTION FRAZIER HANDLE 12FR (TUBING) ×3
SUCTION TUBE FRAZIER 12FR DISP (TUBING) ×1 IMPLANT
SUT ETHILON 2 0 PS N (SUTURE) ×4 IMPLANT
SUT MNCRL AB 3-0 PS2 18 (SUTURE) ×3 IMPLANT
SUT MON AB 2-0 CT1 36 (SUTURE) ×3 IMPLANT
SUT PDS AB 1 CT1 27 (SUTURE) ×6 IMPLANT
SUT VIC AB 1 CT1 36 (SUTURE) ×3 IMPLANT
SUT VIC AB 2-0 CT1 27 (SUTURE) ×3
SUT VIC AB 2-0 CT1 TAPERPNT 27 (SUTURE) ×1 IMPLANT
SUTURE TAPE 1.3 40 TPR END (SUTURE) IMPLANT
SUTURETAPE 1.3 40 TPR END (SUTURE)
SWAB COLLECTION DEVICE MRSA (MISCELLANEOUS) ×4 IMPLANT
SWAB CULTURE ESWAB REG 1ML (MISCELLANEOUS) ×4 IMPLANT
TOWEL OR 17X26 10 PK STRL BLUE (TOWEL DISPOSABLE) ×3 IMPLANT
TOWEL OR NON WOVEN STRL DISP B (DISPOSABLE) ×3 IMPLANT
TOWER SMARTMIX MINI (MISCELLANEOUS) IMPLANT
YANKAUER SUCT BULB TIP 10FT TU (MISCELLANEOUS) ×3 IMPLANT

## 2021-04-20 NOTE — Anesthesia Procedure Notes (Signed)
Anesthesia Regional Block: Interscalene brachial plexus block   Pre-Anesthetic Checklist: , timeout performed,  Correct Patient, Correct Site, Correct Laterality,  Correct Procedure, Correct Position, site marked,  Risks and benefits discussed,  Pre-op evaluation,  At surgeon's request and post-op pain management  Laterality: Right  Prep: Maximum Sterile Barrier Precautions used, chloraprep       Needles:  Injection technique: Single-shot  Needle Type: Echogenic Stimulator Needle     Needle Length: 5cm  Needle Gauge: 22     Additional Needles:   Procedures:, nerve stimulator,,, ultrasound used (permanent image in chart),,     Nerve Stimulator or Paresthesia:  Response: Biceps response  Additional Responses:   Narrative:  Start time: 04/20/2021 2:34 PM End time: 04/20/2021 2:44 PM Injection made incrementally with aspirations every 5 mL.  Performed by: Personally  Anesthesiologist: Roderic Palau, MD

## 2021-04-20 NOTE — Progress Notes (Signed)
Assisted Dr. Roderic Palau with Right Interscalene Brachial Plexus block. Side rails up, monitors on throughout procedure. See vital signs in flow sheet. Tolerated Procedure well.

## 2021-04-20 NOTE — H&P (Signed)
Hayden Garza    Chief Complaint: Infected right Reverse shoulder arthoplasty HPI: The patient is a 87 y.o. male well-known to our practice after previous right shoulder reverse arthroplasty that we performed back in 2011.  He has unfortunately had some chronic right shoulder pain since the index surgery.  He underwent a work-up back in 2015 for the possibility of infection versus loosening which was inconclusive at that time.  We did have some concerns regarding some erosion around the implant and potentially some migration of his glenosphere.  He presents to the office this week with a several day history of a gradually enlarging mass on the anterior aspect of his right shoulder.  He denies any recent fevers or chills or febrile illnesses or other systemic illnesses.  Medical history is otherwise as listed below.  Past Medical History:  Diagnosis Date   Allergic rhinitis    Anemia    BPH (benign prostatic hyperplasia)    With obstruction/lower urinary tract symptoms. Takes cardura for prostate.    CAD (coronary artery disease)    a. 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    Past Surgical History:  Procedure Laterality Date   APPENDECTOMY     BACK SURGERY     vertebral collapse in lower back had insturment, reports no metal   CARDIAC CATHETERIZATION N/A 05/02/2015   Procedure: Left Heart Cath and Coronary Angiography;  Surgeon: Lorretta Harp, MD;  Location: [facility];  Service: Cardiovascular;  Laterality: N/A;   CARDIAC CATHETERIZATION N/A 05/02/2015   Procedure: Coronary Stent Intervention;  Surgeon: Lorretta Harp, MD;  Location: [facility];  Service: Cardiovascular;  Laterality: N/A;    CORONARY ANGIOPLASTY WITH STENT PLACEMENT     JOINT REPLACEMENT Right    6 surgeries including total shoulder replacement roughly in 2000   MOHS SURGERY     TOTAL SHOULDER REPLACEMENT      Family History  Problem Relation Age of Onset   Heart disease Mother        chf   Heart attack Father    CAD Father    Colitis Sister    Other Brother        head trauma   Heart disease Brother    Hyperlipidemia Brother    Hypertension Brother    Diabetes Brother    Kidney failure Brother    Diabetes Son     Social History:  reports that he has never smoked. He has never used smokeless tobacco. He reports that he does not drink alcohol and does not use drugs.   Medications Prior to Admission  Medication Sig Dispense Refill   aspirin EC 81 MG tablet Take 4 tablets (325 mg total) by mouth daily. (Patient taking differently: Take 81 mg by mouth daily.)     aspirin-acetaminophen-caffeine (EXCEDRIN MIGRAINE) 250-250-65 MG tablet Take 1 tablet by mouth every 6 (six) hours as needed for headache.     Cholecalciferol (VITAMIN D) 50 MCG (2000 UT) tablet Take 2,000 Units by mouth daily.     Coenzyme Q10 (COQ10) 100 MG CAPS Take 100 mg by mouth daily.  doxazosin (CARDURA) 8 MG tablet Take 1 tablet (8 mg total) by mouth at bedtime. 90 tablet 1   ferrous gluconate (FERGON) 240 (27 FE) MG tablet Take 240 mg by mouth daily.     finasteride (PROSCAR) 5 MG tablet TAKE 1 TABLET EVERY DAY 90 tablet 1   glimepiride (AMARYL) 4 MG tablet TAKE 1 TABLET EVERY DAY BEFORE BREAKFAST 90 tablet 1   lisinopril (ZESTRIL) 2.5 MG tablet Take 1 tablet (2.5 mg total) by mouth daily. 90 tablet 2   metFORMIN (GLUCOPHAGE) 500 MG tablet TAKE 2 TABLETS TWICE DAILY WITH MEALS (Patient taking differently: Take 500 mg by mouth daily.) 360 tablet 0   metoprolol tartrate (LOPRESSOR) 25 MG tablet Take 1/2 tablets (12.5 mg total) by mouth 2 (two) times daily. (Patient taking differently: Take 25 mg by mouth daily.) 180 tablet 3    Multiple Vitamin (MULTIVITAMIN WITH MINERALS) TABS tablet Take 1 tablet by mouth daily.     omeprazole (PRILOSEC) 40 MG capsule Take 1 capsule (40 mg total) by mouth daily. 90 capsule 1   pioglitazone (ACTOS) 15 MG tablet TAKE 1 TABLET EVERY DAY 90 tablet 1   rosuvastatin (CRESTOR) 5 MG tablet TAKE 1 TABLET EVERY DAY 90 tablet 1   traMADol (ULTRAM) 50 MG tablet Take 1 tablet (50 mg total) by mouth every 12 (twelve) hours as needed (pain). (Patient taking differently: Take 50 mg by mouth at bedtime as needed (pain).) 10 tablet 0   zolpidem (AMBIEN) 10 MG tablet Take 1 tablet (10 mg total) by mouth at bedtime as needed for sleep. (Patient taking differently: Take 5-10 mg by mouth at bedtime.) 90 tablet 1   chlorhexidine (PERIDEX) 0.12 % solution After brushing, rinse with one capful for one minute twice a day then spit out (Patient not taking: No sig reported) 473 mL 0   ferrous sulfate 325 (65 FE) MG tablet Take 1 tablet (325 mg total) by mouth every other day. (Patient not taking: No sig reported)  0   nitroGLYCERIN (NITROSTAT) 0.4 MG SL tablet PLACE 1 TABLET UNDER THE TONGUE EVERY 5 MINUTES AS NEEDED FOR CHESTPAIN 25 tablet 3     Physical Exam: Inspection of the right shoulder demonstrates a soft tissue mass approximately 3 x 3 cm with approximate 2 cm of elevation over the proximal aspect of his previous right shoulder incision.  This area is mild to moderately tender.  It is fluctuant.  He does have some moderate discomfort with passive motion of the shoulder.  Active motion is quite limited secondary to pain.  He is otherwise neurovascular intact in the right upper extremity.  2 view plain x-rays of the right shoulder performed in our office yesterday were reviewed showing evidence for significant erosion around the glenosphere and the glenoid baseplate as well as around the humeral metaphysis.  He has a longstem implant which is cemented and I do not see any obvious changes to suggest loosening of  the stem.  An aspiration of the fluctuant region was performed yielding easily 10 cc of greenish-yellow purulent material.  This was sent off for routine analysis with a white count of over 90,000 and all PMNs.  Vitals  Temp:  [98.5 F (36.9 C)] 98.5 F (36.9 C) (11/10 1339) Pulse Rate:  [59-73] 62 (11/10 1554) Resp:  [8-24] 15 (11/10 1554) BP: (104-152)/(58-101) 113/59 (11/10 1554) SpO2:  [96 %-100 %] 100 % (11/10 1554) Weight:  [73.5 kg] 73.5 kg (11/10 1339)  Assessment/Plan  Impression: Infected right Reverse  shoulder arthoplasty  Plan of Action: Procedure(s): Removal of infected right shoulder arthroplasty, implantation of cement spacer versus 1 stage revision  Joliet Mallozzi M Stephene Alegria 04/20/2021, 4:08 PM Contact # [phone]

## 2021-04-20 NOTE — Anesthesia Preprocedure Evaluation (Addendum)
Anesthesia Evaluation  Patient identified by MRN, date of birth, ID band Patient awake    Reviewed: Allergy & Precautions, H&P , NPO status , Patient's Chart, lab work & pertinent test results, reviewed documented beta blocker date and time   Airway Mallampati: III  TM Distance: >3 FB Neck ROM: Full  Mouth opening: Limited Mouth Opening  Dental no notable dental hx. (+) Teeth Intact, Dental Advisory Given   Pulmonary neg pulmonary ROS,    Pulmonary exam normal breath sounds clear to auscultation       Cardiovascular hypertension, Pt. on medications and Pt. on home beta blockers + CAD, + Past MI and + Cardiac Stents   Rhythm:Regular Rate:Normal     Neuro/Psych negative neurological ROS  negative psych ROS   GI/Hepatic Neg liver ROS, GERD  Medicated,  Endo/Other  diabetes, Type 2, Oral Hypoglycemic Agents  Renal/GU negative Renal ROS  negative genitourinary   Musculoskeletal   Abdominal   Peds  Hematology  (+) Blood dyscrasia, anemia ,   Anesthesia Other Findings   Reproductive/Obstetrics negative OB ROS                            Anesthesia Physical Anesthesia Plan  ASA: 3  Anesthesia Plan: General   Post-op Pain Management:  Regional for Post-op pain   Induction: Intravenous  PONV Risk Score and Plan: 3 and Ondansetron, Dexamethasone and Treatment may vary due to age or medical condition  Airway Management Planned: Oral ETT  Additional Equipment:   Intra-op Plan:   Post-operative Plan: Extubation in OR  Informed Consent: I have reviewed the patients History and Physical, chart, labs and discussed the procedure including the risks, benefits and alternatives for the proposed anesthesia with the patient or authorized representative who has indicated his/her understanding and acceptance.     Dental advisory given  Plan Discussed with: CRNA  Anesthesia Plan Comments:          Anesthesia Quick Evaluation

## 2021-04-20 NOTE — Op Note (Signed)
04/20/2021  7:17 PM  PATIENT:   Hayden Garza  87 y.o. male  PRE-OPERATIVE DIAGNOSIS:  Infected right Reverse shoulder arthoplasty  POST-OPERATIVE DIAGNOSIS: Same  PROCEDURE:  1.  Exploration irrigation and debridement of infected right shoulder reverse arthroplasty with explantation of the implant  2.  Implantation of an antibiotic impregnated cement spacer right shoulder  SURGEON:  Dirk Vanaman, Metta Clines. M.D.  ASSISTANTS: Jenetta Loges, PA-C  ANESTHESIA:   General endotracheal and interscalene block with Exparel  EBL: 250 cc  SPECIMEN: Superficial and deep cultures were obtained for routine culture and sensitivity  Drains: A vacuum-assisted closure device was placed over the incision   PATIENT DISPOSITION:  PACU - hemodynamically stable.    PLAN OF CARE: Admit to inpatient   Brief history:  Hayden Garza is an 87 year old gentleman well-known to our practice after a previous right shoulder reverse arthroplasty that I performed back in 2011.  He has had a somewhat difficult recovery with some longstanding shoulder pain.  He did undergo a work-up in 2015 for possibility of infection versus loosening but no definitive evidence of loosening or infection was identified at that time.  He presented to our office yesterday with a 3-day history of increasing pain as well as localized swelling about the anterior aspect of his right shoulder.  On presentation to the office he was found to have a 3 x 3 cm area of fluctuance and prominence about the proximal aspect of his right shoulder incision.  This region was aspirated yielding 10 cc of purulent material.  Subsequent fluid analysis demonstrated a PMN count of almost 100,000.  He has subsequently been brought to the operating this time for planned I&D with anticipated explantation of his implant.  Preoperatively, I counseled the patient regarding treatment options and risks versus benefits thereof.  Possible surgical complications were all  reviewed including potential for bleeding, infection, neurovascular injury, persistent pain, loss of motion, anesthetic complication, failure of the implant, and possible need for additional surgery. They understand and accept and agrees with our planned procedure.   Procedure in detail:  After undergoing routine preop evaluation the patient received an interscalene block with Exparel that was established in the holding area by the anesthesia department.  Antibiotics were held until additional cultures could be obtained at the time of surgery.  Patient placed supine on the operating table and underwent smooth induction of a general endotracheal anesthesia.  Placed into the beachchair position and appropriately padded and protected.  The right shoulder girdle region was sterilely prepped and draped in standard fashion.  Timeout was called.  A deltopectoral approach the right shoulder was made through his previous incision approximately 12 cm in length.  The proximal aspect where the area of prominence and fluctuance was entered and a large amount of purulent material was evacuated.  We did take initial cultures of this region labeled as superficial abscess.  Skin flaps were elevated and dissection was then carried deeply and there was significant deformity of the soft tissues we ultimately identified the deltopectoral interval which we developed proximal to distal and this was completely scarified and was not easily separated from the deeper tissues.  As this plane was developed number of traversing bleeders were coagulated.  The cephalic vein was compromised and so this was ligated distally.  There was also some compromise of the conjoined tendon due to the deformity and marked scarring.  Ultimately dissection was carried down to the joint space where again purulence was identified and deep  cultures were obtained.  Careful circumferential dissection was then made about the proximal humeral metaphysis in a  subperiosteal fashion.  Large amounts of inflammatory capsular tissue was excised and extensive synovectomy was performed globally.  Ultimately implant was dislocated.  The polyethylene liner was removed.  We then cleaned circumferentially around the humeral stem.  It initially appeared to been well fixed.  We then turned our attention to the glenosphere which again we meticulously and laboriously dissected free circumferentially and ultimately were able to gain access to the central screw and remove the glenosphere and then subsequent removed all of locking screws from the baseplate and the baseplate was subsequently removed.  We did find evidence for purulence beneath the glenosphere and beneath the baseplate and the remnant of the glenoid was then curetted and pulsatile lavaged and meticulously cleaned and debrided.  At this point we returned attention back to the humeral stem and using the slaphammer system were readily able to remove the implant from the cement sleeve.  At this point we decided that removal of as much cement as possible would be are appropriate strategy and so we carefully lysed a thin flexible osteotomes to remove as much of the cement mantle as possible trying to preserve the humeral cortex although certainly there were number violations due to the markedly thin nature of the bone.  Ultimately were able to remove enough cement mantle such that the stem for the exact tech cement spacer set to the appropriate level and a trial reduction showed good position and alignment.  At this point we pulses are lavaged the humeral canal as well as all the surrounding soft tissues.  The canal was dried.  We then mixed the antibiotic impregnated cement and once it up achieved appropriate consistently the cement was introduced into the humeral canal and the [employer] stem was then inserted at approximately 30 degrees retroversion and cement was allowed to harden and it was appropriately molded and contoured  such that the humeral head had a proper position alignment and good height.  Once all the cement had hardened we then again irrigated the joint.  Final reduction was then performed.  We are pleased with the overall alignment and soft tissue tension.  At this point the previously noted compromise of the conjoined tendon was then repaired using a PDS #2 suture.  The deltopectoral interval was then reapproximated with a series of figure-of-eight #1 PDS sutures.  The skin was closed with 2-0 nylon vertical mattress sutures.  The vacuum-assisted closure device was then applied over the incision and placed to suction.  The right arm was placed in the sling.  The patient was awakened, extubated, and taken to the recovery in stable condition.  Jenetta Loges, PA-C was utilized as an Environmental consultant throughout this case, essential for help with positioning the patient, positioning extremity, tissue manipulation, implantation of the prosthesis, suture management, wound closure, and intraoperative decision-making.  Marin Shutter MD   Contact # [phone]

## 2021-04-20 NOTE — Anesthesia Procedure Notes (Signed)
Procedure Name: Intubation Date/Time: 04/20/2021 4:21 PM Performed by: Milford Cage, CRNA Pre-anesthesia Checklist: Patient identified, Emergency Drugs available, Suction available and Patient being monitored Patient Re-evaluated:Patient Re-evaluated prior to induction Oxygen Delivery Method: Circle system utilized Preoxygenation: Pre-oxygenation with 100% oxygen Induction Type: IV induction Ventilation: Mask ventilation without difficulty Laryngoscope Size: Mac and 3 Grade View: Grade II Tube type: Oral Tube size: 7.5 mm Number of attempts: 1 Airway Equipment and Method: Stylet Placement Confirmation: ETT inserted through vocal cords under direct vision, positive ETCO2 and breath sounds checked- equal and bilateral Secured at: 21 cm Tube secured with: Tape Dental Injury: Teeth and Oropharynx as per pre-operative assessment

## 2021-04-20 NOTE — Transfer of Care (Signed)
Immediate Anesthesia Transfer of Care Note  Patient: Hayden Garza  Procedure(s) Performed: Removal of infected right shoulder arthroplasty, implantation of cement spacer (Right: Shoulder)  Patient Location: PACU  Anesthesia Type:GA combined with regional for post-op pain  Level of Consciousness: awake  Airway & Oxygen Therapy: Patient Spontanous Breathing  Post-op Assessment: Report given to RN and Post -op Vital signs reviewed and unstable, Anesthesiologist notified  Post vital signs: Reviewed and unstable - hypotension, neo gtt continues  Last Vitals:  Vitals Value Taken Time  BP 86/66 04/20/21 1940  Temp    Pulse 85 04/20/21 1941  Resp 13 04/20/21 1941  SpO2 97 % 04/20/21 1941  Vitals shown include unvalidated device data.  Last Pain:  Vitals:   04/20/21 1339  TempSrc: Oral  PainSc: 0-No pain      Patients Stated Pain Goal: 3 (02/30/17 2091)  Complications: No notable events documented.

## 2021-04-21 LAB — GLUCOSE, CAPILLARY
Glucose-Capillary: 299 mg/dL — ABNORMAL HIGH (ref 70–99)
Glucose-Capillary: 312 mg/dL — ABNORMAL HIGH (ref 70–99)
Glucose-Capillary: 201 mg/dL — ABNORMAL HIGH (ref 70–99)
Glucose-Capillary: 170 mg/dL — ABNORMAL HIGH (ref 70–99)

## 2021-04-21 LAB — HEMOGLOBIN A1C
Hgb A1c MFr Bld: 10.8 % — ABNORMAL HIGH (ref 4.8–5.6)
Mean Plasma Glucose: 263.26 mg/dL

## 2021-04-21 MED ORDER — CHLORHEXIDINE GLUCONATE CLOTH 2 % EX PADS
6.0000 | MEDICATED_PAD | Freq: Every day | CUTANEOUS | Status: DC
  Administered 2021-04-21 – 2021-04-24 (×4): 6 via TOPICAL

## 2021-04-21 MED ORDER — VANCOMYCIN HCL 1500 MG/300ML IV SOLN
1500.0000 mg | INTRAVENOUS | Status: DC
  Administered 2021-04-21 – 2021-04-23 (×2): 1500 mg via INTRAVENOUS
  Filled 2021-04-21 (×2): qty 300

## 2021-04-21 MED ORDER — SODIUM CHLORIDE 0.9% FLUSH
10.0000 mL | Freq: Two times a day (BID) | INTRAVENOUS | Status: DC
  Administered 2021-04-21: 10 mL

## 2021-04-21 MED ORDER — SODIUM CHLORIDE 0.9% FLUSH: 10.0000 mL | INTRAVENOUS | Status: DC | PRN

## 2021-04-21 NOTE — TOC Transition Note (Signed)
Transition of Care [facility]) - CM/SW Discharge Note   Patient Details  Name: Hayden Garza MRN: [id number] Date of Birth: 01-18-1933  [facility] CM/SW Contact:  Lennart Pall, LCSW Phone Number: 04/21/2021, 1:03 PM   Clinical Narrative:    Met with pt and son to review dc planning needs.  Pt has had PICC line placed today and aware plan for IV abx at home.  Agreeable with plan for [facility] as well and has no agency preference. (Pt does note that his daughter-in-law is retired Therapist, sports from [employer] and will be available to help as needed.) Referrals placed with [employer] ([facility]) for IV abx coverage and Carolynn Sayers, RN to meet with pt/ family this afternoon for education.  Referral placed with [facility] for [facility] oversight visits.  Per PA, anticipate pt will be ready for dc in the morning.  No further TOC needs at this time.   Final next level of care: [place] Barriers to Discharge: No Barriers Identified   Patient Goals and CMS Choice Patient states their goals for this hospitalization and ongoing recovery are:: return home      Discharge Placement                       Discharge Plan and Services                DME Arranged: N/A DME Agency: NA       HH Arranged: IV Antibiotics, RN [place] Agency: [employer], [place]     Representative spoke with at [place]: Carolynn Sayers, RN with [facility] and Adela Lank with [facility]  Social Determinants of Health ([place]) Interventions     Readmission Risk Interventions No flowsheet data found.

## 2021-04-21 NOTE — Anesthesia Postprocedure Evaluation (Signed)
Anesthesia Post Note  Patient: Hayden Garza  Procedure(s) Performed: Removal of infected right shoulder arthroplasty, implantation of cement spacer (Right: Shoulder)     Patient location during evaluation: PACU Anesthesia Type: General and Regional Level of consciousness: awake and alert Pain management: pain level controlled Vital Signs Assessment: post-procedure vital signs reviewed and stable Respiratory status: spontaneous breathing, nonlabored ventilation, respiratory function stable and patient connected to nasal cannula oxygen Cardiovascular status: blood pressure returned to baseline and stable Postop Assessment: no apparent nausea or vomiting Anesthetic complications: no   No notable events documented.  Last Vitals:  Vitals:   04/20/21 2245 04/20/21 2258  BP: (!) 107/59 105/71  Pulse: 86 88  Resp: 16 20  Temp:  36.5 C  SpO2: 98% 97%    Last Pain:  Vitals:   04/20/21 2258  TempSrc: Oral  PainSc:                  Esterlene Atiyeh,W. EDMOND

## 2021-04-21 NOTE — Progress Notes (Signed)
MOHMED FARVER  MRN: [id number] DOB/Age: 11/14/1932 87 y.o. Physician: Ander Slade, M.D. 1 Day Post-Op Procedure(s) (LRB): Removal of infected right shoulder arthroplasty, implantation of cement spacer (Right)  Subjective: Patient sitting at bedside finishing dinner, reports resting comfortably.  Denies nausea or vomiting.  Reports shoulder pain is mild, just received first oral pain medication.  States that overall his shoulder is feeling better now than it has after all of his previous shoulder surgeries  Vital Signs Temp:  [97.3 F (36.3 C)-98.2 F (36.8 C)] 98.1 F (36.7 C) (11/11 1710) Pulse Rate:  [69-97] 73 (11/11 1710) Resp:  [10-21] 16 (11/11 1710) BP: (81-127)/(43-86) 127/54 (11/11 1710) SpO2:  [93 %-100 %] 97 % (11/11 1710)  Lab Results Recent Labs    04/20/21 1332  WBC 9.9  HGB 11.2*  HCT 35.8*  PLT 226   BMET Recent Labs    04/20/21 1332  NA 135  K 4.7  CL 103  CO2 22  GLUCOSE 252*  BUN 27*  CREATININE 1.72*  CALCIUM 9.4   INR  Date Value Ref Range Status  05/01/2015 1.13 0.00 - 1.49 Final     Exam:  Inspection of the right shoulder demonstrates that the vacuum-assisted dressing is intact with good seal.  There has been no drainage into the system.  Some mild diffuse swelling noted about the anterior aspect of the right shoulder but there is no fluctuance.  He is grossly neurovascular intact elbow is block still appears to be having some effect.  Minimal discomfort with gentle passive motion of the shoulder.  Laboratories:  Previous cultures have not grown a definitive organism at this time.  White cell count greater than 90,000 on the aspiration from the office.  Impression:  Status post explantation of infected right shoulder reverse arthroplasty.  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  Definitive cultures are pending   Plan I spoke with Dr. Candiss Norse from infectious  disease on the phone this evening.  Plan is to continue on vancomycin and ceftriaxone until definitive culture results are back and then adjust antibiotics accordingly.  We also discussed that since there is a small amount of retained bone cement within the humerus but certainly was distant from the epicenter of the infectious process, that it would be of benefit for anticipated long-term suppressive antibiotics once he has completed his course of IV antibiotics.  Arrangements have been made for home IV antibiotics and patient is highly motivated for discharge home tomorrow if all arrangements have been made.  His antibiotics will need to be adjusted once final cultures have been received.  Upon discharge home tomorrow he will need to be converted to the portable Prevena vacuum-assisted device which is at the bedside.  Nursing staff will help with this transition and instructions.  I would like to see him back in my office next Friday for wound check.   Continue with sling immobilization of the right upper extremity.  He may perform gentle range of motion of the elbow, wrist, and hand.   Lamon Rotundo M Heike Pounds 04/21/2021, 5:41 PM   Contact # [phone]

## 2021-04-21 NOTE — Plan of Care (Signed)
?  Problem: Activity: ?Goal: Risk for activity intolerance will decrease ?Outcome: Progressing ?  ?Problem: Safety: ?Goal: Ability to remain free from injury will improve ?Outcome: Progressing ?  ?Problem: Pain Managment: ?Goal: General experience of comfort will improve ?Outcome: Progressing ?  ?

## 2021-04-21 NOTE — Progress Notes (Signed)
Peripherally Inserted Central Catheter Placement  The IV Nurse has discussed with the patient and/or persons authorized to consent for the patient, the purpose of this procedure and the potential benefits and risks involved with this procedure.  The benefits include less needle sticks, lab draws from the catheter, and the patient may be discharged home with the catheter. Risks include, but not limited to, infection, bleeding, blood clot (thrombus formation), and puncture of an artery; nerve damage and irregular heartbeat and possibility to perform a PICC exchange if needed/ordered by physician.  Alternatives to this procedure were also discussed.  Bard Power PICC patient education guide, fact sheet on infection prevention and patient information card has been provided to patient /or left at bedside.    PICC Placement Documentation  PICC Single Lumen 07/61/51 Left Basilic 42 cm 0 cm (Active)  Indication for Insertion or Continuance of Line Home intravenous therapies (PICC only) 04/21/21 1221  Exposed Catheter (cm) 0 cm 04/21/21 1221  Site Assessment Clean;Dry;Intact 04/21/21 1221  Line Status Flushed;Saline locked;Blood return noted 04/21/21 1221  Dressing Type Transparent;Securing device 04/21/21 1221  Dressing Status Clean;Dry;Intact 04/21/21 1221  Antimicrobial disc in place? Yes 04/21/21 1221  Safety Lock Not Applicable 83/43/73 5789  Dressing Intervention Other (Comment) 04/21/21 1221  Dressing Change Due 04/28/21 04/21/21 1221    Patient signed consent using his L hand since patient is post op R shoulder surgery. PICC placed on LUA.   Enos Fling 04/21/2021, 12:23 PM

## 2021-04-21 NOTE — Consult Note (Addendum)
[place] [place] for Infectious Disease    Date of Admission:  04/20/2021   Total days of antibiotics 2        Reason for Consult: right shoulder PJI   Active Problems:   Septic joint of right shoulder region [facility])   Assessment: 87 year old male with complicated history of right shoulder interventions started in the 1990s,  underwent right shoulder reverse arthroplasty 4431 complicated by right should pain which was worked up in 2015 admitted for right shoulder PJI.  He had presented to orthopedic clinic for a 3 cm mass over the right shoulder enlarging over the past couple of days.  Taken to the OR for hardware removal.  #Right shoulder PJI SP hardware removal and cement placement on 04/20/21 #Uncontrolled DM2  -A1c 10.8 on 11/11 -Pre-hospital abx: none -Per ortho there is retained cement away from the infection site.  -OR cultures gram stain + GPC and GPR  Recommendations:  -Follow OR Cx completion -Continue Vancomycin and ceftriaxone 2gm q24h -Given old cement in in place, he will  need long term suppression with antibiotics   Microbiology:  Antibiotics: 11/10 vancomycin 11/11 Ceftriaxone Cultures: 04/20/21 OR wound Cx: 1/4 GS+ GPC and GPR OR fungal and acid fast Cx pending  HPI: Hayden Garza is a 87 y.o. male with right shoulder reverse arthroplasty in 5400 complicated by chronic right shoulder pain leading to work-up that showed possible infection versus loosening in 2015 of the right shoulder admitted by orthopedic surgery as he presented  to clinic with several day history of gradually enlarging mass on the anterior aspect of right shoulder(3x3cm).  Patient had no associated fevers ,chills. He underwent removal of hardware with implantation of cement spacer on 11/10.  He was started on broad-spectrum antibiotics vancomycin and ceftiraxone  Gram stain so far has shown GPC  and GPR, ID consulted for antibiotic recommendations: Today, pt is resting in bed. He  reports he initially underwent surgery for his right shoulder  because of "tore tendons" in the mid 1990s. He used to be a Administrator. He reports multiple interventions since that time. He denies ever being on long term antibiotics and that this was the first time he had a boil in that area. Denies Hx of IVDA. He denies being on antibiotics prior to hospitalization.    Review of Systems: Review of Systems  All other systems reviewed and are negative.  Past Medical History:  Diagnosis Date   Allergic rhinitis    Anemia    BPH (benign prostatic hyperplasia)    With obstruction/lower urinary tract symptoms. Takes cardura for prostate.    CAD (coronary artery disease)    a. 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    Social History   Tobacco Use   Smoking status: Never   Smokeless tobacco: Never  Substance Use Topics   Alcohol use: No   Drug use: No    Family History  Problem Relation Age of Onset   Heart disease Mother        chf   Heart attack Father    CAD Father    Colitis  Sister    Other Brother        head trauma   Heart disease Brother    Hyperlipidemia Brother    Hypertension Brother    Diabetes Brother    Kidney failure Brother    Diabetes Son    Scheduled Meds:  aspirin EC  81 mg Oral Daily   Chlorhexidine Gluconate Cloth  6 each Topical Daily   docusate sodium  100 mg Oral BID   doxazosin  8 mg Oral QHS   finasteride  5 mg Oral Daily   insulin aspart  0-15 Units Subcutaneous TID WC   lisinopril  2.5 mg Oral Daily   metoprolol tartrate  25 mg Oral Daily   sodium chloride flush  10-40 mL Intracatheter Q12H   zolpidem  5 mg Oral QHS,MR X 1   Continuous Infusions:  sodium chloride      cefTRIAXone (ROCEPHIN)  IV     lactated ringers 75 mL/hr at 04/20/21 2322   methocarbamol (ROBAXIN) IV     vancomycin     PRN Meds:.acetaminophen, bisacodyl, diphenhydrAMINE, HYDROmorphone (DILAUDID) injection, menthol-cetylpyridinium **OR** phenol, methocarbamol **OR** methocarbamol (ROBAXIN) IV, metoCLOPramide **OR** metoCLOPramide (REGLAN) injection, nitroGLYCERIN, ondansetron **OR** ondansetron (ZOFRAN) IV, oxyCODONE, oxyCODONE, polyethylene glycol, sodium chloride flush Allergies  Allergen Reactions   Lipitor [Atorvastatin]     Joint and muscle pain   Penicillins     swelling in joints    OBJECTIVE: Blood pressure (!) 115/59, pulse 76, temperature 98.2 F (36.8 C), temperature source Oral, resp. rate 18, height 5\' 5"  (1.651 m), weight 73.5 kg, SpO2 96 %.  Physical Exam Constitutional:      General: He is not in acute distress.    Appearance: He is normal weight. He is not toxic-appearing.  HENT:     Head: Normocephalic and atraumatic.     Right Ear: External ear normal.     Left Ear: External ear normal.     Nose: No congestion or rhinorrhea.     Mouth/Throat:     Mouth: Mucous membranes are moist.     Pharynx: Oropharynx is clear.  Eyes:     Extraocular Movements: Extraocular movements intact.     Conjunctiva/sclera: Conjunctivae normal.     Pupils: Pupils are equal, round, and reactive to light.  Cardiovascular:     Rate and Rhythm: Normal rate and regular rhythm.     Heart sounds: No murmur heard.   No friction rub. No gallop.  Pulmonary:     Effort: Pulmonary effort is normal.     Breath sounds: Normal breath sounds.  Abdominal:     General: Abdomen is flat. Bowel sounds are normal.     Palpations: Abdomen is soft.  Musculoskeletal:        General: No swelling.     Cervical back: Normal range of motion and neck supple.     Comments: Right shoulder, limited ROM, wound vac in place  Skin:    General: Skin is warm and dry.  Neurological:     General: No focal  deficit present.     Mental Status: He is oriented to person, place, and time.  Psychiatric:        Mood and Affect: Mood normal.    Lab Results Lab Results  Component Value Date   WBC 9.9 04/20/2021   HGB 11.2 (L) 04/20/2021   HCT 35.8 (L) 04/20/2021   MCV 101.1 (H) 04/20/2021   PLT 226 04/20/2021    Lab Results  Component Value Date  CREATININE 1.72 (H) 04/20/2021   BUN 27 (H) 04/20/2021   NA 135 04/20/2021   K 4.7 04/20/2021   CL 103 04/20/2021   CO2 22 04/20/2021    Lab Results  Component Value Date   ALT 14 12/29/2020   AST 16 12/29/2020   ALKPHOS 60 12/29/2020   BILITOT 0.4 12/29/2020       Laurice Record, [place] for Infectious [facility] 04/21/2021, 12:25 PM

## 2021-04-21 NOTE — Progress Notes (Signed)
Pharmacy Antibiotic Note  Hayden Garza is a 87 y.o. male admitted on 04/20/2021 with  wound infection . Pharmacy has been consulted for Vancomycin dosing. He is s/p removal of infected R shoulder arthroplasty 11/10 with antibiotic spacer placed.  Cultures obtained while in OR. Scr slightly elevated from baseline Scr ~1.5  Plan: Vancomycin 1500mg  IV q48h to target AUC 400-550 Check Vancomycin levels at steady state Monitor renal function and cx data   Height: 5\' 5"  (165.1 cm) Weight: 73.5 kg (162 lb) IBW/kg (Calculated) : 61.5  Temp (24hrs), Avg:97.8 F (36.6 C), Min:97.3 F (36.3 C), Max:98.5 F (36.9 C)  Recent Labs  Lab 04/20/21 1332  WBC 9.9  CREATININE 1.72*    Estimated Creatinine Clearance: 26.8 mL/min (A) (by C-G formula based on SCr of 1.72 mg/dL (H)).    Allergies  Allergen Reactions   Lipitor [Atorvastatin]     Joint and muscle pain   Penicillins     swelling in joints    Antimicrobials this admission: 11/10 Vancomycin >>  11/10 Rocephin >>   Dose adjustments this admission:  Microbiology results:  11/10 Wound Cx-R shoulder synovial fluid:  11/10 Wound Cx-R shoulder deep tissue: few GPR, few GPC 11/10 Fungus Cx:   Thank you for allowing pharmacy to be a part of this patient's care.  Netta Cedars PharmD 04/21/2021 1:02 AM

## 2021-04-22 LAB — ACID FAST SMEAR (AFB, MYCOBACTERIA)
Acid Fast Smear: NEGATIVE
Acid Fast Smear: NEGATIVE

## 2021-04-22 LAB — GLUCOSE, CAPILLARY
Glucose-Capillary: 252 mg/dL — ABNORMAL HIGH (ref 70–99)
Glucose-Capillary: 297 mg/dL — ABNORMAL HIGH (ref 70–99)
Glucose-Capillary: 168 mg/dL — ABNORMAL HIGH (ref 70–99)
Glucose-Capillary: 201 mg/dL — ABNORMAL HIGH (ref 70–99)

## 2021-04-22 NOTE — Progress Notes (Signed)
Orthopedics Progress Note  Subjective: Patient reports feeling some better today with minimal pain  Objective:  Vitals:   04/22/21 0157 04/22/21 0558  BP: 121/66 (!) 114/49  Pulse: 94 94  Resp: 16 16  Temp: 98.1 F (36.7 C) 97.7 F (36.5 C)  SpO2: 94% 92%    General: Awake and alert  Musculoskeletal: Right shoulder with wound vac in place and working properly Neurovascularly intact  Lab Results  Component Value Date   WBC 9.9 04/20/2021   HGB 11.2 (L) 04/20/2021   HCT 35.8 (L) 04/20/2021   MCV 101.1 (H) 04/20/2021   PLT 226 04/20/2021       Component Value Date/Time   NA 135 04/20/2021 1332   K 4.7 04/20/2021 1332   CL 103 04/20/2021 1332   CO2 22 04/20/2021 1332   GLUCOSE 252 (H) 04/20/2021 1332   BUN 27 (H) 04/20/2021 1332   CREATININE 1.72 (H) 04/20/2021 1332   CREATININE 1.10 05/24/2015 0801   CALCIUM 9.4 04/20/2021 1332   GFRNONAA 38 (L) 04/20/2021 1332   GFRAA >60 05/03/2015 0623    Lab Results  Component Value Date   INR 1.13 05/01/2015   INR 0.98 12/04/2011   INR 1.04 02/24/2010    Assessment/Plan: s/p Procedure(s): Removal of infected right shoulder arthroplasty, implantation of cement spacer Right shoulder cultures still pending. Per Dr Susie Cassette note, awaiting final cultures so Abx can be selected to match organism and then home on IV abx. Appreciate ID help. Will follow cultures today  Doran Heater. Veverly Fells, MD 04/22/2021 7:05 AM

## 2021-04-22 NOTE — Progress Notes (Signed)
ID brief note  Prosthetic joint infection  Cx still cooking  Continue empiric vanc/ceftriaxone for now

## 2021-04-22 NOTE — Plan of Care (Signed)
  Problem: Education: Goal: Knowledge of General Education information will improve Description: Including pain rating scale, medication(s)/side effects and non-pharmacologic comfort measures Outcome: Progressing   Problem: Activity: Goal: Risk for activity intolerance will decrease Outcome: Progressing   Problem: Pain Managment: Goal: General experience of comfort will improve Outcome: Progressing   

## 2021-04-23 LAB — GLUCOSE, CAPILLARY
Glucose-Capillary: 302 mg/dL — ABNORMAL HIGH (ref 70–99)
Glucose-Capillary: 308 mg/dL — ABNORMAL HIGH (ref 70–99)
Glucose-Capillary: 148 mg/dL — ABNORMAL HIGH (ref 70–99)

## 2021-04-23 LAB — CREATININE, SERUM
Creatinine, Ser: 1.55 mg/dL — ABNORMAL HIGH (ref 0.61–1.24)
GFR, Estimated: 43 mL/min — ABNORMAL LOW (ref >60)

## 2021-04-23 MED ORDER — LIP MEDEX EX OINT
TOPICAL_OINTMENT | CUTANEOUS | Status: DC | PRN
  Filled 2021-04-23: qty 7

## 2021-04-23 NOTE — Plan of Care (Signed)
  Problem: Education: Goal: Knowledge of General Education information will improve Description Including pain rating scale, medication(s)/side effects and non-pharmacologic comfort measures Outcome: Progressing   

## 2021-04-23 NOTE — Progress Notes (Signed)
Orthopedics Progress Note  Subjective: Patient reports minimal pain. No BM since Wednesday  Objective:  Vitals:   04/22/21 2013 04/23/21 0549  BP: (!) 139/57 (!) 168/78  Pulse: 88 (!) 105  Resp: 16 17  Temp: 98.5 F (36.9 C) 98.2 F (36.8 C)  SpO2: 92% 92%    General: Awake and alert  Musculoskeletal: Right shoulder with wound vac in place and working properly Neurovascularly intact  Lab Results  Component Value Date   WBC 9.9 04/20/2021   HGB 11.2 (L) 04/20/2021   HCT 35.8 (L) 04/20/2021   MCV 101.1 (H) 04/20/2021   PLT 226 04/20/2021       Component Value Date/Time   NA 135 04/20/2021 1332   K 4.7 04/20/2021 1332   CL 103 04/20/2021 1332   CO2 22 04/20/2021 1332   GLUCOSE 252 (H) 04/20/2021 1332   BUN 27 (H) 04/20/2021 1332   CREATININE 1.55 (H) 04/23/2021 0309   CREATININE 1.10 05/24/2015 0801   CALCIUM 9.4 04/20/2021 1332   GFRNONAA 43 (L) 04/23/2021 0309   GFRAA >60 05/03/2015 0623    Lab Results  Component Value Date   INR 1.13 05/01/2015   INR 0.98 12/04/2011   INR 1.04 02/24/2010    Assessment/Plan: s/p Procedure(s): Removal of infected right shoulder arthroplasty, implantation of cement spacer Right shoulder cultures still pending. Per Dr Susie Cassette note, awaiting final cultures so Abx can be selected to match organism and then home on IV abx. Appreciate ID help. Will follow cultures today  Dorothyann Peng, PA-C 04/23/2021 8:40 AM

## 2021-04-24 ENCOUNTER — Ambulatory Visit: Payer: Medicare HMO

## 2021-04-24 ENCOUNTER — Other Ambulatory Visit (HOSPITAL_BASED_OUTPATIENT_CLINIC_OR_DEPARTMENT_OTHER): Payer: Self-pay

## 2021-04-24 DIAGNOSIS — M009 Pyogenic arthritis, unspecified: Secondary | ICD-10-CM

## 2021-04-24 LAB — CREATININE, SERUM
Creatinine, Ser: 1.47 mg/dL — ABNORMAL HIGH (ref 0.61–1.24)
GFR, Estimated: 46 mL/min — ABNORMAL LOW (ref >60)

## 2021-04-24 LAB — GLUCOSE, CAPILLARY
Glucose-Capillary: 128 mg/dL — ABNORMAL HIGH (ref 70–99)
Glucose-Capillary: 195 mg/dL — ABNORMAL HIGH (ref 70–99)
Glucose-Capillary: 252 mg/dL — ABNORMAL HIGH (ref 70–99)

## 2021-04-24 MED ORDER — TRAMADOL HCL 50 MG PO TABS
50.0000 mg | ORAL_TABLET | Freq: Two times a day (BID) | ORAL | 0 refills | Status: DC | PRN
Start: 2021-04-24 — End: 2021-08-25
  Filled 2021-04-24: qty 30, 15d supply, fill #0

## 2021-04-24 MED ORDER — HEPARIN SOD (PORK) LOCK FLUSH 100 UNIT/ML IV SOLN
250.0000 [IU] | INTRAVENOUS | Status: AC | PRN
  Administered 2021-04-24: 250 [IU]
  Filled 2021-04-24: qty 2.5

## 2021-04-24 MED ORDER — MAGNESIUM HYDROXIDE 400 MG/5ML PO SUSP
30.0000 mL | Freq: Every day | ORAL | Status: DC | PRN
  Administered 2021-04-24: 30 mL via ORAL
  Filled 2021-04-24: qty 30

## 2021-04-24 MED ORDER — DOXYCYCLINE HYCLATE 50 MG PO CAPS
100.0000 mg | ORAL_CAPSULE | Freq: Two times a day (BID) | ORAL | 0 refills | Status: DC
  Filled 2021-04-24: qty 72, 18d supply, fill #0

## 2021-04-24 MED ORDER — OXYCODONE-ACETAMINOPHEN 5-325 MG PO TABS
1.0000 | ORAL_TABLET | ORAL | 0 refills | Status: DC | PRN
  Filled 2021-04-24: qty 12, 2d supply, fill #0

## 2021-04-24 MED ORDER — CEFTRIAXONE IV (FOR PTA / DISCHARGE USE ONLY): 2.0000 g | INTRAVENOUS | 0 refills | Status: AC

## 2021-04-24 NOTE — Discharge Summary (Addendum)
PATIENT ID:      Hayden Garza  MRN:     [id number] DOB/AGE:    1932-12-22 / 87 y.o.     DISCHARGE SUMMARY  ADMISSION DATE:    04/20/2021 DISCHARGE DATE:  04/24/2021  ADMISSION DIAGNOSIS: Infected right Reverse shoulder arthoplasty Past Medical History:  Diagnosis Date   Allergic rhinitis    Anemia    BPH (benign prostatic hyperplasia)    With obstruction/lower urinary tract symptoms. Takes cardura for prostate.    CAD (coronary artery disease)    a. 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  HOSPITAL COURSE:  JAHZIER VILLALON is a 87 y.o. admitted on 04/20/2021 with a diagnosis of Infected right Reverse shoulder arthoplasty.  They were brought to the operating room on 04/20/2021 and underwent Procedure(s): Removal of infected right shoulder arthroplasty, implantation of cement spacer.    They were given perioperative antibiotics:  Anti-infectives (From admission, onward)    Start     Dose/Rate Route Frequency Ordered Stop   04/24/21 0000  cefTRIAXone (ROCEPHIN) IVPB        2 g Intravenous Every 24 hours 04/24/21 1202 05/31/21 2359   04/24/21 0000  doxycycline (VIBRAMYCIN) 50 MG capsule        100 mg Oral 2 times daily 04/24/21 1202     04/21/21 2200  vancomycin (VANCOREADY) IVPB 1500 mg/300 mL  Status:  Discontinued        1,500 mg 150 mL/hr over 120 Minutes Intravenous Every 48 hours 04/21/21 0118 04/24/21 1017   04/21/21 1700  cefTRIAXone (ROCEPHIN) 2 g in  sodium chloride 0.9 % 100 mL IVPB        2 g 200 mL/hr over 30 Minutes Intravenous Every 24 hours 04/20/21 2308     04/20/21 1847  vancomycin (VANCOCIN) powder  Status:  Discontinued          As needed 04/20/21 1847 04/20/21 2301   04/20/21 1703  vancomycin (VANCOCIN) 1-5 GM/200ML-% IVPB       Note to Pharmacy: Georgena Spurling   : cabinet override      04/20/21 1703 04/20/21 2333   04/20/21 1703  sodium chloride 0.9 % with cefTRIAXone (ROCEPHIN) ADS Med       Note to Pharmacy: Georgena Spurling   : cabinet override      04/20/21 1703 04/20/21 2334     .  Patient underwent the above named procedure and tolerated it well. The following day they were hemodynamically stable and pain was controlled on oral analgesics. They were neurovascularly intact to the operative extremity.  We had placed a VAC on his incision that remained patent with 0 drainage throughout his hospital course.  We consulted infectious disease who followed him along closely and instructed on final antibiotic therapy.  A PICC line was placed.  He remained stable.  On the date of discharge his only complaint was  still lack of a bowel movement and the nursing staff was going to work on this prior to his discharge.  His home health IV antibiotic therapy was arranged and at this time he was stable for discharge to home to follow-up closely on outpatient basis.  I did remove his back and replaced an Aquacel dressing in instructions to shower and we will call him with his follow-up appointment in the next few days.  His shoulder at the time of discharge showed no erythema no fluctuance no deep soft tissue swelling.  He was neurovascular intact and stable for discharge.    DIAGNOSTIC STUDIES:  RECENT RADIOGRAPHIC STUDIES :  [place] EKG SITE RITE  Result Date: 04/20/2021 If Site Rite image not attached, placement could not be confirmed due to current cardiac rhythm.   RECENT VITAL SIGNS:  Patient Vitals for the past 24 hrs:  BP Temp Temp src  Pulse Resp SpO2  04/24/21 0628 139/68 98.7 F (37.1 C) Oral 93 18 97 %  04/23/21 2108 (!) 165/90 99.7 F (37.6 C) Oral 96 16 96 %  04/23/21 1348 (!) 151/76 98.4 F (36.9 C) -- 82 16 95 %  .  RECENT EKG RESULTS:    Orders placed or performed in visit on 06/15/20   EKG 12-Lead    DISCHARGE INSTRUCTIONS:  Discharge Instructions     [facility] pharmacist to adjust dose for Vancomycin, Aminoglycosides and other anti-infective therapies as requested by physician.   Complete by: As directed    [facility] to provide Cath Flo 16m   Complete by: As directed    Administer for PICC line occlusion and as ordered by physician for other access device issues.   Anaphylaxis Kit: Provided to treat any anaphylactic reaction to the medication being provided to the patient if First Dose or when requested by physician   Complete by: As directed    Epinephrine 146mml vial / amp: Administer 0.56m51m0.56ml32mubcutaneously once for moderate to severe anaphylaxis, nurse to call physician and pharmacy when reaction occurs and call 911 if needed for immediate care   Diphenhydramine 50mg75mIV vial: Administer 25-50mg 31mM PRN for first dose reaction, rash, itching, mild reaction, nurse to call physician and pharmacy when reaction occurs   Sodium Chloride 0.9% NS 500ml I6mdminister if needed for hypovolemic blood pressure drop or as ordered by physician after call to physician with anaphylactic reaction   Change dressing on IV access line weekly and PRN   Complete by: As directed    Flush IV access with Sodium Chloride 0.9% and Heparin 10 units/ml or 100 units/ml   Complete by: As directed    Home infusion instructions - [facility]   Complete by: As directed    Instructions: Flush IV access with Sodium Chloride 0.9% and Heparin 10units/ml or 100units/ml   Change dressing on IV access line: Weekly and PRN   Instructions Cath Flo 2mg: Ad42mister for PICC Line occlusion and as  ordered by physician for other access device   [facility] pharmacist to adjust dose for: Vancomycin, Aminoglycosides and other anti-infective therapies as requested by physician   Method of administration may be changed at the discretion of home infusion pharmacist based upon assessment of the patient and/or caregiver's ability to self-administer the medication ordered   Complete by: As directed    Outpatient Parenteral Antibiotic Therapy Information Antibiotic: Ceftriaxone (Rocephin) IVPB; Indications for use: R-shoulder PJI; End Date: 05/31/2021   Complete by: As directed  Antibiotic: Ceftriaxone (Rocephin) IVPB   Indications for use: R-shoulder PJI   End Date: 05/31/2021       DISCHARGE MEDICATIONS:   Allergies as of 04/24/2021       Reactions   Lipitor [atorvastatin]    Joint and muscle pain   Penicillins    swelling in joints        Medication List     STOP taking these medications    aspirin-acetaminophen-caffeine 250-250-65 MG tablet Commonly known as: EXCEDRIN MIGRAINE   ferrous sulfate 325 (65 FE) MG tablet       TAKE these medications    aspirin EC 81 MG tablet Take 4 tablets (325 mg total) by mouth daily. What changed: how much to take   cefTRIAXone  IVPB Commonly known as: ROCEPHIN Inject 2 g into the vein daily. Indication:  R-shoulder PJI First Dose: Yes Last Day of Therapy:  05/31/21 Labs - Once weekly:  CBC/D and BMP, Labs - Every other week:  ESR and CRP Method of administration: IV Push Method of administration may be changed at the discretion of home infusion pharmacist based upon assessment of the patient and/or caregiver's ability to self-administer the medication ordered.   chlorhexidine 0.12 % solution Commonly known as: PERIDEX After brushing, rinse with one capful for one minute twice a day then spit out   CoQ10 100 MG Caps Take 100 mg by mouth daily.   doxazosin 8 MG tablet Commonly known as: CARDURA Take 1 tablet  (8 mg total) by mouth at bedtime.   doxycycline 50 MG capsule Commonly known as: VIBRAMYCIN Take 2 capsules (100 mg total) by mouth 2 (two) times daily.   ferrous gluconate 240 (27 FE) MG tablet Commonly known as: FERGON Take 240 mg by mouth daily.   finasteride 5 MG tablet Commonly known as: PROSCAR TAKE 1 TABLET EVERY DAY   glimepiride 4 MG tablet Commonly known as: AMARYL TAKE 1 TABLET EVERY DAY BEFORE BREAKFAST   lisinopril 2.5 MG tablet Commonly known as: ZESTRIL Take 1 tablet (2.5 mg total) by mouth daily.   metFORMIN 500 MG tablet Commonly known as: GLUCOPHAGE TAKE 2 TABLETS TWICE DAILY WITH MEALS What changed: See the new instructions.   metoprolol tartrate 25 MG tablet Commonly known as: LOPRESSOR Take 1/2 tablets (12.5 mg total) by mouth 2 (two) times daily. What changed:  how much to take when to take this   multivitamin with minerals Tabs tablet Take 1 tablet by mouth daily.   nitroGLYCERIN 0.4 MG SL tablet Commonly known as: NITROSTAT PLACE 1 TABLET UNDER THE TONGUE EVERY 5 MINUTES AS NEEDED FOR CHESTPAIN   omeprazole 40 MG capsule Commonly known as: PRILOSEC Take 1 capsule (40 mg total) by mouth daily.   oxyCODONE-acetaminophen 5-325 MG tablet Commonly known as: Percocet Take 1 tablet by mouth every 4 (four) hours as needed for severe pain (max 6 q).   pioglitazone 15 MG tablet Commonly known as: ACTOS TAKE 1 TABLET EVERY DAY   rosuvastatin 5 MG tablet Commonly known as: CRESTOR TAKE 1 TABLET EVERY DAY   traMADol 50 MG tablet Commonly known as: ULTRAM Take 1 tablet (50 mg total) by mouth every 12 (twelve) hours as needed (pain). What changed: when to take this   Vitamin D 50 MCG (2000 UT) tablet Take 2,000 Units by mouth daily.   zolpidem 10 MG tablet Commonly known as: AMBIEN Take 1 tablet (10 mg total) by mouth at bedtime as needed for sleep. What changed:  how much  to take when to take this               Discharge Care  Instructions  (From admission, onward)           Start     Ordered   04/24/21 0000  Change dressing on IV access line weekly and PRN  (Home infusion instructions - [facility] )        04/24/21 1202            FOLLOW UP VISIT:    Follow-up Information     [facility] [place] Follow up.   Specialty: [place] Why: to provide home health nursing visits Contact information: [address] [place] [address] [phone]                 DISCHARGE TO: Home   DISCHARGE CONDITION:  [place] for Dr. Justice Britain 04/24/2021, 12:02 PM

## 2021-04-24 NOTE — Progress Notes (Signed)
ATLEE KLUTH  MRN: [id number] DOB/Age: 11/17/1932 87 y.o. [facility] Procedure: Procedure(s) (LRB): Removal of infected right shoulder arthroplasty, implantation of cement spacer (Right)     Subjective:  Patient is anxious to go home.  His only issue currently is that he is having some difficulty with bowel movement.  He has not had a bowel movement since Wednesday and is having some gas and bloating at night.  Minimal abdominal pain though  Vital Signs Temp:  [98.4 F (36.9 C)-99.7 F (37.6 C)] 98.7 F (37.1 C) (11/14 0628) Pulse Rate:  [82-96] 93 (11/14 0628) Resp:  [16-18] 18 (11/14 0628) BP: (139-165)/(68-90) 139/68 (11/14 0628) SpO2:  [95 %-97 %] 97 % (11/14 0628)  Lab Results No results for input(s): WBC, HGB, HCT, PLT in the last 72 hours. BMET Recent Labs    04/23/21 0309 04/24/21 0401  CREATININE 1.55* 1.47*   INR  Date Value Ref Range Status  05/01/2015 1.13 0.00 - 1.49 Final     Exam His right shoulder incision and VAC dressing has had no output.  He has minimal fluctuance or effusion about the soft tissues of the shoulder.  I went ahead and removed this dressing so he has 1 less thing to deal with at home.  He is neurovascularly intact.  I replaced a clean Aquacel dressing that is waterproof.        Plan  Nursing is giving him medications to help with bowel movement prior to his discharge today.  Otherwise infectious disease has provided and outlined IV antibiotic therapy and will adjust accordingly.  He has still had no final cultures and the cultures are still growing.  He will continue with IV ceftriaxone and we will otherwise call him for his follow-up next week in our office.  Eaden Hettinger PA-C  04/24/2021, 11:55 AM Contact # [phone]

## 2021-04-24 NOTE — Plan of Care (Signed)
  Problem: Pain Managment: Goal: General experience of comfort will improve Outcome: Progressing   Problem: Safety: Goal: Ability to remain free from injury will improve Outcome: Progressing   

## 2021-04-24 NOTE — Progress Notes (Signed)
PHARMACY CONSULT NOTE FOR:  OUTPATIENT  PARENTERAL ANTIBIOTIC THERAPY (OPAT)  Indication: R-shoulder PJI Regimen: Rocephin 2g IV q24h + Doxycycline 100 mg po bid End date: 05/31/21  IV antibiotic discharge orders are pended. To discharging provider:  please sign these orders via discharge navigator,  Select New Orders & click on the button choice - Manage This Unsigned Work.     Thank you for allowing pharmacy to be a part of this patient's care.  Alycia Rossetti, PharmD, BCPS Clinical Pharmacist 04/24/2021 11:14 AM   **Pharmacist phone directory can now be found on [email or web address] ([email or web address]).  Listed under [place].

## 2021-04-24 NOTE — Progress Notes (Signed)
Diagnosis: 87 year old male with complicated history of right shoulder interventions started in the 1990s,  underwent right shoulder reverse arthroplasty 8441 complicated by right should pain which was worked up in 2015 admitted for right shoulder PJI.  He had presented to orthopedic clinic for a 3 cm mass over the right shoulder enlarging over the past couple of days.  s/p right shoulder PJI SP hardware removal and cement placement on 04/20/21    Culture Result: 11/09 office aspirate cx gpc/gpr. Cx not growing yet  Will do doxy/ceftriaxone and adjust in abx once cx available  Discussed with primary team  Allergies  Allergen Reactions   Lipitor [Atorvastatin]     Joint and muscle pain   Penicillins     swelling in joints    OPAT Orders Discharge antibiotics to be given via PICC line Discharge antibiotics: ceftriaxone  Duration: 6 weeks End Date: 12/21  [facility] Care Per Protocol:  Home health RN for IV administration and teaching; PICC line care and labs.    Labs weekly while on IV antibiotics: _x_ CBC with differential __ BMP _x_ CMP _x_ CRP __ ESR __ Vancomycin trough __ CK  _x_ Please pull PIC at completion of IV antibiotics __ Please leave PIC in place until doctor has seen patient or been notified  Fax weekly labs to [phone]  Clinic Follow Up Appt: 11/21 @ 230 with Dr Gale Journey  @  [facility] [address], [place], [place] [address] Phone: [phone]  -------- Subjective: Pain improving but still bad right shoulder No n/v/diarrhea No rash Anxious to go Cx not yet growing   Exam: General/constitutional: no distress, pleasant HEENT: Normocephalic, PER, Conj Clear, EOMI, Oropharynx clear Neck supple CV: rrr no mrg Lungs: clear to auscultation, normal respiratory effort Abd: Soft, Nontender Ext: no edema Skin: No Rash Neuro: nonfocal MSK: right shoulder on sling; wound vac in place    Labs/imaging reviewed: Lab Results  Component  Value Date   WBC 9.9 04/20/2021   HGB 11.2 (L) 04/20/2021   HCT 35.8 (L) 04/20/2021   MCV 101.1 (H) 04/20/2021   PLT 226 55/83/1674   Last metabolic panel Lab Results  Component Value Date   GLUCOSE 252 (H) 04/20/2021   NA 135 04/20/2021   K 4.7 04/20/2021   CL 103 04/20/2021   CO2 22 04/20/2021   BUN 27 (H) 04/20/2021   CREATININE 1.47 (H) 04/24/2021   GFRNONAA 46 (L) 04/24/2021   CALCIUM 9.4 04/20/2021   PROT 6.3 12/29/2020   ALBUMIN 3.9 12/29/2020   BILITOT 0.4 12/29/2020   ALKPHOS 60 12/29/2020   AST 16 12/29/2020   ALT 14 12/29/2020   ANIONGAP 10 04/20/2021   No results found for: CRP

## 2021-04-24 NOTE — TOC Progression Note (Signed)
Transition of Care [facility]) - Progression Note   Patient Details  Name: Hayden Garza MRN: [id number] Date of Birth: May 01, 1933  Transition of Care [facility]) CM/SW [place], LCSW Phone Number: 04/24/2021, 12:28 PM  Clinical Narrative: Patient to discharge home today. CSW met with patient to notify him Pam with [facility] will be coming today to do IV antibiotics teaching. [facility] aware patient will discharge today. TOC signing off.  Barriers to Discharge: No Barriers Identified  Expected Discharge Plan and Services Expected Discharge Date: 04/24/21               DME Arranged: N/A DME Agency: NA HH Arranged: IV Antibiotics, RN [place] Agency: [facility], [place] Representative spoke with at [place]: Carolynn Sayers, RN with [facility] and Adela Lank with [facility]  Readmission Risk Interventions No flowsheet data found.

## 2021-04-25 DIAGNOSIS — M009 Pyogenic arthritis, unspecified: Secondary | ICD-10-CM | POA: Diagnosis not present

## 2021-04-25 LAB — AEROBIC/ANAEROBIC CULTURE W GRAM STAIN (SURGICAL/DEEP WOUND)
Culture: NORMAL
Gram Stain: NONE SEEN
Gram Stain: NONE SEEN

## 2021-04-25 LAB — ANAEROBIC CULTURE W GRAM STAIN: Gram Stain: NONE SEEN

## 2021-04-26 ENCOUNTER — Encounter (HOSPITAL_COMMUNITY): Payer: Self-pay | Admitting: Orthopedic Surgery

## 2021-04-27 LAB — ACID FAST SMEAR (AFB, MYCOBACTERIA): Acid Fast Smear: NEGATIVE

## 2021-05-01 ENCOUNTER — Inpatient Hospital Stay: Payer: Medicare HMO | Admitting: Internal Medicine

## 2021-05-01 DIAGNOSIS — Z4789 Encounter for other orthopedic aftercare: Secondary | ICD-10-CM | POA: Diagnosis not present

## 2021-05-01 DIAGNOSIS — Z96611 Presence of right artificial shoulder joint: Secondary | ICD-10-CM | POA: Diagnosis not present

## 2021-05-01 DIAGNOSIS — M00811 Arthritis due to other bacteria, right shoulder: Secondary | ICD-10-CM | POA: Diagnosis not present

## 2021-05-02 ENCOUNTER — Telehealth: Payer: Self-pay

## 2021-05-02 ENCOUNTER — Other Ambulatory Visit: Payer: Self-pay

## 2021-05-02 ENCOUNTER — Ambulatory Visit: Payer: Medicare HMO | Admitting: Internal Medicine

## 2021-05-02 ENCOUNTER — Encounter: Payer: Self-pay | Admitting: Internal Medicine

## 2021-05-02 ENCOUNTER — Other Ambulatory Visit (HOSPITAL_BASED_OUTPATIENT_CLINIC_OR_DEPARTMENT_OTHER): Payer: Self-pay

## 2021-05-02 VITALS — Wt 155.4 lb

## 2021-05-02 DIAGNOSIS — T8450XD Infection and inflammatory reaction due to unspecified internal joint prosthesis, subsequent encounter: Secondary | ICD-10-CM

## 2021-05-02 MED ORDER — DOXYCYCLINE HYCLATE 100 MG PO TABS
100.0000 mg | ORAL_TABLET | Freq: Two times a day (BID) | ORAL | 0 refills | Status: DC
  Filled 2021-05-02: qty 60, 30d supply, fill #0

## 2021-05-02 MED ORDER — CEFADROXIL 500 MG PO CAPS
1000.0000 mg | ORAL_CAPSULE | Freq: Two times a day (BID) | ORAL | 0 refills | Status: AC
  Filled 2021-05-02: qty 120, 30d supply, fill #0

## 2021-05-02 NOTE — Progress Notes (Addendum)
[facility] for Infectious Disease  Patient Active Problem List   Diagnosis Date Noted   Septic joint of right shoulder region [facility]) 04/20/2021   Insomnia 12/24/2020   Renal insufficiency 06/26/2020   BCC (basal cell carcinoma of skin) 06/26/2020   Right shoulder pain 06/26/2020   CAD (coronary artery disease) 06/15/2015   Essential hypertension 06/15/2015   Anemia 06/15/2015   NSTEMI (non-ST elevated myocardial infarction) ([place]) 04/30/2015   Dyspnea 05/22/2013   Hyperlipidemia 05/22/2013   Diabetes mellitus type 2, noninsulin dependent ([place]) 10/17/2009   ALLERGIC RHINITIS 10/17/2009   PULMONARY NODULE, RIGHT LOWER LOBE 10/17/2009      Subjective:    Patient ID: Hayden Garza, male    DOB: [birth year], 87 y.o.   MRN: [id number]  Chief Complaint  Patient presents with   Follow-up    PICC clean dry and intact; no complaints;     HPI:  Hayden Garza is a 87 y.o. 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 The tissue culture "normal flora" has p acnes growing with mixed flora (no strep group a/staph). The other 2 samples only grew p-acnes and was collected 1700. The one with mixed flora was collected 1730   Patient reported that his surgeon has no plan at this time to replace a new joint as his bone stock is minimal  No diarrhea, n/v He is actually constipated  He does report a little irritation at the end of his urination; no urgency. Has chronic frequency every few hours. No blood      Allergies  Allergen Reactions   Lipitor [Atorvastatin]     Joint and muscle pain   Penicillins     swelling in joints      Outpatient Medications  Prior to Visit  Medication Sig Dispense Refill   cefTRIAXone (ROCEPHIN) IVPB Inject 2 g into the vein daily. Indication:  R-shoulder PJI First Dose: Yes Last Day of Therapy:  05/31/21 Labs - Once weekly:  CBC/D and BMP, Labs - Every other week:  ESR and CRP Method of administration: IV Push Method of administration may be changed at the discretion of home infusion pharmacist based upon assessment of the patient and/or caregiver's ability to self-administer the medication ordered. 37 Units 0   metFORMIN (GLUCOPHAGE) 500 MG tablet TAKE 2 TABLETS TWICE DAILY WITH MEALS (Patient taking differently: Take 500 mg by mouth daily.) 360 tablet 0   aspirin EC 81 MG tablet Take 4 tablets (325 mg total) by mouth daily. (Patient taking differently: Take 81 mg by mouth daily.)     chlorhexidine (PERIDEX) 0.12 % solution After brushing, rinse with one capful for one minute twice a day then spit out (Patient not taking: No sig reported) 473 mL 0   Cholecalciferol (VITAMIN D) 50 MCG (2000 UT) tablet Take 2,000 Units by mouth daily.     Coenzyme Q10 (COQ10) 100 MG CAPS Take 100 mg by mouth daily.     doxazosin (CARDURA) 8 MG tablet Take 1 tablet (8 mg total) by mouth at bedtime. 90 tablet 1   doxycycline (VIBRAMYCIN) 50 MG capsule Take 2 capsules (100 mg total)  by mouth 2 (two) times daily. (Patient not taking: Reported on 05/02/2021) 72 capsule 0   ferrous gluconate (FERGON) 240 (27 FE) MG tablet Take 240 mg by mouth daily.     finasteride (PROSCAR) 5 MG tablet TAKE 1 TABLET EVERY DAY 90 tablet 1   glimepiride (AMARYL) 4 MG tablet TAKE 1 TABLET EVERY DAY BEFORE BREAKFAST 90 tablet 1   lisinopril (ZESTRIL) 2.5 MG tablet Take 1 tablet (2.5 mg total) by mouth daily. 90 tablet 2   metoprolol tartrate (LOPRESSOR) 25 MG tablet Take 1/2 tablets (12.5 mg total) by mouth 2 (two) times daily. (Patient taking differently: Take 25 mg by mouth daily.) 180 tablet 3   Multiple Vitamin (MULTIVITAMIN WITH MINERALS) TABS tablet  Take 1 tablet by mouth daily.     nitroGLYCERIN (NITROSTAT) 0.4 MG SL tablet PLACE 1 TABLET UNDER THE TONGUE EVERY 5 MINUTES AS NEEDED FOR CHESTPAIN 25 tablet 3   omeprazole (PRILOSEC) 40 MG capsule Take 1 capsule (40 mg total) by mouth daily. 90 capsule 1   oxyCODONE-acetaminophen (PERCOCET) 5-325 MG tablet Take 1 tablet by mouth every 4 (four) hours as needed for severe pain (max 6 q). 12 tablet 0   pioglitazone (ACTOS) 15 MG tablet TAKE 1 TABLET EVERY DAY 90 tablet 1   rosuvastatin (CRESTOR) 5 MG tablet TAKE 1 TABLET EVERY DAY 90 tablet 1   traMADol (ULTRAM) 50 MG tablet Take 1 tablet (50 mg total) by mouth every 12 (twelve) hours as needed (pain). 30 tablet 0   zolpidem (AMBIEN) 10 MG tablet Take 1 tablet (10 mg total) by mouth at bedtime as needed for sleep. (Patient taking differently: Take 5-10 mg by mouth at bedtime.) 90 tablet 1   No facility-administered medications prior to visit.     Social History   Socioeconomic History   Marital status: Married    Spouse name: Not on file   Number of children: 1   Years of education: Not on file   Highest education level: Not on file  Occupational History   Occupation: Retired  Tobacco Use   Smoking status: Never   Smokeless tobacco: Never  Substance and Sexual Activity   Alcohol use: No   Drug use: No   Sexual activity: Not on file  Other Topics Concern   Not on file  Social History Narrative   Lives in [place], [place] with wife. No pets. No dietary restrictions      Retired from truck driving, long haul   No cigarettes, alcohol to excess   Seat belts every time.    Social Determinants of Health   Financial Resource Strain: Not on file  Food Insecurity: Not on file  Transportation Needs: Not on file  Physical Activity: Not on file  Stress: Not on file  Social Connections: Not on file  Intimate Partner Violence: Not on file      Review of Systems All other ros negative  Objective:    Wt 155 lb 6.4 oz (70.5 kg)    BMI 25.86 kg/m  Nursing note and vital signs reviewed.  Physical Exam General/constitutional: no distress, pleasant HEENT: Normocephalic, PER, Conj Clear, EOMI, Oropharynx clear Neck supple CV: rrr no mrg Lungs: clear to auscultation, normal respiratory effort Abd: Soft, Nontender Ext: no edema Skin: No Rash Neuro: nonfocal MSK: right shoulder incision intact -- sutures intact no dehiscence; no swelling/redness -- in sling  Central line presence: lue picc site no erythema/tenderness/purulence       Labs:  Micro:  Serology:  Imaging:  Assessment & Plan:   Problem List Items Addressed This Visit   None Visit Diagnoses     Infection of prosthetic joint, subsequent encounter    -  Primary         No orders of the defined types were placed in this encounter.    87 year old male with complicated history of right shoulder interventions started in the 1990s,  underwent right shoulder reverse arthroplasty 6606 complicated by right should pain which was worked up in 2015 admitted for right shoulder PJI.  He had presented to orthopedic clinic for a 3 cm mass over the right shoulder enlarging over the past couple of days.  s/p right shoulder PJI SP hardware removal and cement placement on 04/20/21     11/09 cx p-acnes but one plate with gpc/gpr. Suspect contaminant gpc/gpr (not worked up but no staph/strep)  -Will continue doxy. Will also use cefadroxil. Stop ceftriaxone -new rx doxy 100 mg twice a day and cefadroxil 500 mg to take 1000 mg twice a day -plan 6 weeks total tx; depending on clinical reevaluation might extend another 2-4 weeks -see me in 2 weeks -hh to remove picc and stop ceftriaxone as of 11/22 -can discuss with pcp to check ua for brief discomfort, do not worry about infection at this time; has been on ceftriaxone -- can let me know on next visit if persistent  Follow-up: Return in about 2 weeks (around 05/16/2021).   ---------- 12/01 addendum I  forwarded my note to Dr Onnie Graham his orthopedic surgeon. At this time there was some retained cement that couldn't be removed.   Agree with plan for chronic suppressive abx. He does have a relatively benign option of doxycycline to take  Also his previous aspirate in the office only grew p-acnes. It is reasonable to stop cefadroxil (will let patient know to stop)   Jabier Mutton, [place] for [place] [phone]  pager   [phone] cell 05/02/2021, 10:48 AM

## 2021-05-02 NOTE — Patient Instructions (Signed)
Stop the current doxy 50 mg capsule you are taking Will also remove picc by home health and stop ceftriaxone   2 new antibiotics to start taking today for the next 30 days: Doxy 100 mg 1 tab twice a day Cefadroxil 500 mg take 2 tab twice a day  (this is similar to ceftriaxone - so you should be fine with the penicillin allergy)   See me in 2 weeks

## 2021-05-02 NOTE — Telephone Encounter (Signed)
Thanks Dorie!

## 2021-05-02 NOTE — Telephone Encounter (Signed)
Verbal orders given to [facility] with [facility] to pull PICC. Patient will be taking oral abx for the next 30 days.   Routing to pharmacy team.   Carlean Purl, RN

## 2021-05-03 ENCOUNTER — Other Ambulatory Visit (HOSPITAL_BASED_OUTPATIENT_CLINIC_OR_DEPARTMENT_OTHER): Payer: Self-pay

## 2021-05-03 NOTE — Telephone Encounter (Signed)
Patient's wife called asking about PICC line removal. Relayed that orders have been given to [facility] and they will arrange removal with home health nurse.   Beryle Flock, RN

## 2021-05-09 ENCOUNTER — Telehealth: Payer: Self-pay

## 2021-05-09 DIAGNOSIS — E119 Type 2 diabetes mellitus without complications: Secondary | ICD-10-CM | POA: Diagnosis not present

## 2021-05-09 NOTE — Telephone Encounter (Signed)
Transition Care Management Unsuccessful Follow-up Telephone Call  Date of discharge and from where:  04/24/2021  [facility]  Attempts:  1st Attempt  Reason for unsuccessful TCM follow-up call:  No answer/busy   Tomasa Rand, RN, BSN, CEN [place] Coordinator [phone]

## 2021-05-10 ENCOUNTER — Other Ambulatory Visit (HOSPITAL_BASED_OUTPATIENT_CLINIC_OR_DEPARTMENT_OTHER): Payer: Self-pay

## 2021-05-10 MED ORDER — TRAMADOL HCL 50 MG PO TABS
ORAL_TABLET | ORAL | 0 refills | Status: DC
  Filled 2021-05-10: qty 30, 30d supply, fill #0

## 2021-05-11 ENCOUNTER — Telehealth: Payer: Self-pay

## 2021-05-11 ENCOUNTER — Telehealth: Payer: Self-pay | Admitting: *Deleted

## 2021-05-11 NOTE — Telephone Encounter (Signed)
Transition Care Management Follow-up Telephone Call Date of discharge and from where: [facility] 04/24/21 How have you been since you were released from the hospital? "Getting better" Any questions or concerns? No  Items Reviewed: Did the pt receive and understand the discharge instructions provided? Yes  Medications obtained and verified? Yes  Other? No  Any new allergies since your discharge? No  Dietary orders reviewed? Yes Do you have support at home? Yes   spouse  Home Care and Equipment/Supplies: Were home health services ordered? no If so, what is the name of the agency? N/a  Has the agency set up a time to come to the patient's home? not applicable Were any new equipment or medical supplies ordered?  No What is the name of the medical supply agency? N/a Were you able to get the supplies/equipment? not applicable Do you have any questions related to the use of the equipment or supplies? N/a  Functional Questionnaire: (I = Independent and D = Dependent) ADLs: I  Bathing/Dressing- I  Meal Prep- I  Eating- I  Maintaining continence- I  Transferring/Ambulation- I  Managing Meds- I  Follow up appointments reviewed:  PCP Hospital f/u appt confirmed? No-   saw specialist [facility] f/u appt confirmed? Yes  , Saw infectious disease Trung Vu on 05/02/21.  Scheduled to see Prudencio Pair on 05/16/21  Are transportation arrangements needed? No  If their condition worsens, is the pt aware to call PCP or go to the Emergency Dept.? Yes Was the patient provided with contact information for the PCP's office or ED? Yes Was to pt encouraged to call back with questions or concerns? Yes  Jacqlyn Larsen [facility], BSN RN Case Manager [phone]

## 2021-05-11 NOTE — Telephone Encounter (Signed)
-----   Message from Jabier Mutton, MD sent at 05/11/2021  9:59 AM EST ----- Hi team Could you let this patient know to continue only the doxycycline twice a day  He can stop the cefadroxil. I was able to confirm with his surgery team about the culture. He only grew p-acnes, so doxycycline is sufficient alone.  No change in follow with me .... thanks

## 2021-05-11 NOTE — Telephone Encounter (Signed)
Per Dr. Gale Journey, ok to stop cefadroxil and only take doxycycline. RN advised patient of this and reminded him of follow up appointment on 12/6. No additional questions/concerns.   Carley Glendenning Lorita Officer, RN

## 2021-05-16 ENCOUNTER — Ambulatory Visit: Payer: Medicare HMO | Admitting: Internal Medicine

## 2021-05-16 ENCOUNTER — Other Ambulatory Visit: Payer: Self-pay

## 2021-05-16 ENCOUNTER — Encounter: Payer: Self-pay | Admitting: Internal Medicine

## 2021-05-16 VITALS — BP 104/68 | HR 71 | Temp 97.7°F | Wt 149.0 lb

## 2021-05-16 DIAGNOSIS — T8450XD Infection and inflammatory reaction due to unspecified internal joint prosthesis, subsequent encounter: Secondary | ICD-10-CM | POA: Diagnosis not present

## 2021-05-16 NOTE — Progress Notes (Signed)
[facility]  Patient Active Problem List   Diagnosis Date Noted   Septic joint of right shoulder region [facility]) 04/20/2021   Insomnia 12/24/2020   Renal insufficiency 06/26/2020   BCC (basal cell carcinoma of skin) 06/26/2020   Right shoulder pain 06/26/2020   CAD (coronary artery disease) 06/15/2015   Essential hypertension 06/15/2015   Anemia 06/15/2015   NSTEMI (non-ST elevated myocardial infarction) ([place]) 04/30/2015   Dyspnea 05/22/2013   Hyperlipidemia 05/22/2013   Diabetes mellitus type 2, noninsulin dependent ([place]) 10/17/2009   ALLERGIC RHINITIS 10/17/2009   PULMONARY NODULE, RIGHT LOWER LOBE 10/17/2009      Subjective:    Patient ID: Hayden Garza, male    DOB: 1932-08-06, 87 y.o.   MRN: [id number]  Chief Complaint  Patient presents with   Follow-up    Infection of prosthetic joint, subsequent encounter    HPI:  Hayden Garza is a 87 y.o. 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 The tissue culture "normal flora" has p acnes growing with mixed flora (no strep group a/staph). The other 2 samples only grew p-acnes and was collected 1700. The one with mixed flora was collected 1730   Patient reported that his surgeon has no plan at this time to replace a new joint as his bone stock is minimal  No diarrhea, n/v He is actually constipated  He does report a little irritation at the end of his urination; no urgency. Has chronic frequency every few hours. No blood  12/06 id clinic f/u I had patient continuing on doxycycline only and stopped cefadroxil for gram negative coverage. The aspirate of his joint only grew  p-acnes while the operative cx grew both p-acnes and some other gram positive flora (only 1 sample) which I suspect is either lab contaminant No f/c Pain improving    Allergies  Allergen Reactions   Lipitor [Atorvastatin]     Joint and muscle pain   Penicillins     swelling in joints      Outpatient Medications Prior to Visit  Medication Sig Dispense Refill   aspirin EC 81 MG tablet Take 4 tablets (325 mg total) by mouth daily. (Patient taking differently: Take 81 mg by mouth daily.)     cefadroxil (DURICEF) 500 MG capsule Take 2 capsules (1,000 mg total) by mouth 2 (two) times daily. 120 capsule 0   chlorhexidine (PERIDEX) 0.12 % solution After brushing, rinse with one capful for one minute twice a day then spit out 473 mL 0   Cholecalciferol (VITAMIN D) 50 MCG (2000 UT) tablet Take 2,000 Units by mouth daily.     Coenzyme Q10 (COQ10) 100 MG CAPS Take 100 mg by mouth daily.     doxazosin (CARDURA) 8 MG tablet Take 1 tablet (8 mg total) by mouth at bedtime. 90 tablet 1   doxycycline (VIBRA-TABS) 100 MG tablet Take 1 tablet (100 mg total) by mouth 2 (two) times daily. 60 tablet 0   ferrous gluconate (FERGON) 240 (27 FE) MG tablet Take 240 mg by mouth daily.     finasteride (PROSCAR) 5 MG tablet TAKE 1 TABLET  EVERY DAY 90 tablet 1   glimepiride (AMARYL) 4 MG tablet TAKE 1 TABLET EVERY DAY BEFORE BREAKFAST 90 tablet 1   lisinopril (ZESTRIL) 2.5 MG tablet Take 1 tablet (2.5 mg total) by mouth daily. 90 tablet 2   metFORMIN (GLUCOPHAGE) 500 MG tablet TAKE 2 TABLETS TWICE DAILY WITH MEALS (Patient taking differently: Take 500 mg by mouth daily.) 360 tablet 0   metoprolol tartrate (LOPRESSOR) 25 MG tablet Take 1/2 tablets (12.5 mg total) by mouth 2 (two) times daily. (Patient taking differently: Take 25 mg by mouth daily.) 180 tablet 3   Multiple Vitamin (MULTIVITAMIN WITH MINERALS) TABS tablet Take 1 tablet by mouth daily.     nitroGLYCERIN (NITROSTAT) 0.4 MG SL tablet PLACE 1 TABLET UNDER  THE TONGUE EVERY 5 MINUTES AS NEEDED FOR CHESTPAIN 25 tablet 3   omeprazole (PRILOSEC) 40 MG capsule Take 1 capsule (40 mg total) by mouth daily. 90 capsule 1   oxyCODONE-acetaminophen (PERCOCET) 5-325 MG tablet Take 1 tablet by mouth every 4 (four) hours as needed for severe pain (max 6 q). 12 tablet 0   pioglitazone (ACTOS) 15 MG tablet TAKE 1 TABLET EVERY DAY 90 tablet 1   rosuvastatin (CRESTOR) 5 MG tablet TAKE 1 TABLET EVERY DAY 90 tablet 1   traMADol (ULTRAM) 50 MG tablet Take 1 tablet by mouth at bedtime as needed for pain 30 tablet 0   cefTRIAXone (ROCEPHIN) IVPB Inject 2 g into the vein daily. Indication:  R-shoulder PJI First Dose: Yes Last Day of Therapy:  05/31/21 Labs - Once weekly:  CBC/D and BMP, Labs - Every other week:  ESR and CRP Method of administration: IV Push Method of administration may be changed at the discretion of home infusion pharmacist based upon assessment of the patient and/or caregiver's ability to self-administer the medication ordered. (Patient not taking: Reported on 05/16/2021) 37 Units 0   traMADol (ULTRAM) 50 MG tablet Take 1 tablet (50 mg total) by mouth every 12 (twelve) hours as needed (pain). (Patient not taking: Reported on 05/16/2021) 30 tablet 0   zolpidem (AMBIEN) 10 MG tablet Take 1 tablet (10 mg total) by mouth at bedtime as needed for sleep. (Patient taking differently: Take 5-10 mg by mouth at bedtime.) 90 tablet 1   No facility-administered medications prior to visit.     Social History   Socioeconomic History   Marital status: Married    Spouse name: Not on file   Number of children: 1   Years of education: Not on file   Highest education level: Not on file  Occupational History   Occupation: Retired  Tobacco Use   Smoking status: Never   Smokeless tobacco: Never  Substance and Sexual Activity   Alcohol use: No   Drug use: No   Sexual activity: Not on file  Other Topics Concern   Not on file  Social History Narrative   Lives  in [place], [place] with wife. No pets. No dietary restrictions      Retired from truck driving, long haul   No cigarettes, alcohol to excess   Seat belts every time.    Social Determinants of Health   Financial Resource Strain: Not on file  Food Insecurity: Not on file  Transportation Needs: Not on file  Physical Activity: Not on file  Stress: Not on file  Social Connections: Not on file  Intimate Partner Violence: Not on file      Review of Systems All other ros negative  Objective:  Wt 149 lb (67.6 kg)   BMI 24.79 kg/m  Nursing note and vital signs reviewed.  Physical Exam General/constitutional: no distress, pleasant HEENT: Normocephalic, PER, Conj Clear, EOMI, Oropharynx clear Neck supple CV: rrr no mrg Lungs: clear to auscultation, normal respiratory effort Abd: Soft, Nontender Ext: no edema Skin: No Rash Neuro: nonfocal MSK: right shoulder incision healed -- sutures removed; very limited range of motion not able to actively internally rotate, or abduct shoulder beyond 20-30 degree. No swelling/tenderness at the shoulder joint though       Labs: Lab Results  Component Value Date   WBC 9.9 04/20/2021   HGB 11.2 (L) 04/20/2021   HCT 35.8 (L) 04/20/2021   MCV 101.1 (H) 04/20/2021   PLT 226 04/20/2021   Last metabolic panel Lab Results  Component Value Date   GLUCOSE 252 (H) 04/20/2021   NA 135 04/20/2021   K 4.7 04/20/2021   CL 103 04/20/2021   CO2 22 04/20/2021   BUN 27 (H) 04/20/2021   CREATININE 1.47 (H) 04/24/2021   GFRNONAA 46 (L) 04/24/2021   CALCIUM 9.4 04/20/2021   PROT 6.3 12/29/2020   ALBUMIN 3.9 12/29/2020   BILITOT 0.4 12/29/2020   ALKPHOS 60 12/29/2020   AST 16 12/29/2020   ALT 14 12/29/2020   ANIONGAP 10 04/20/2021    Micro:  Serology:  Imaging:  Assessment & Plan:   Problem List Items Addressed This Visit   None Visit Diagnoses     Infection of prosthetic joint, subsequent encounter    -  Primary        No  orders of the defined types were placed in this encounter.    88-year-old male with complicated history of right shoulder interventions started in the 1990s,  underwent right shoulder reverse arthroplasty 2011 complicated by right should pain which was worked up in 2015 admitted for right shoulder PJI.  He had presented to orthopedic clinic for a 3 cm mass over the right shoulder enlarging over the past couple of days.  s/p right shoulder PJI SP hardware removal and cement placement on 04/20/21     11/09 cx p-acnes but one plate with gpc/gpr. Suspect contaminant gpc/gpr (not worked up but no staph/strep). Aspirate cx only p-acnes  12/06 assessment Patient had continued only doxycycline since 12/01 He is doing well  His surgeon did mention there was some retained cement that couldn't be removed and we had agreed on continue indefinite antibiotics or at least 6-12 months. At this time there is no plan for a new shoulder surgery as there is no bone stock for that  -continue doxycycline -f/u 6 weeks with repeat inflammatory markers testing -f/u sooner if worsening joint pain -no need for labs today   Follow-up: Return in about 6 weeks (around 06/27/2021).    Trung T Vu, MD [facility] [phone]  pager   [phone] cell 05/16/2021, 11:09 AM  

## 2021-05-16 NOTE — Patient Instructions (Signed)
Continue doxycycline, plan to do indefinite duration  Will get labs on next visit  See me in around 6 weeks

## 2021-05-17 ENCOUNTER — Telehealth: Payer: Self-pay | Admitting: Cardiology

## 2021-05-17 NOTE — Telephone Encounter (Signed)
Spoke with patient about his concern of taking aspirin 325 mg each that was ordered by his orthopedic surgeon in November. He wanted to know if he should only take 81 mg aspirin. I advised him to call his orthopedic surgeon and ask how long he is to be on on the 325 mg dose and to let our clinic know. He said he would call his Psychologist, sport and exercise.

## 2021-05-17 NOTE — Telephone Encounter (Signed)
Pt c/o medication issue:  1. Name of Medication:  aspirin EC 81 MG tablet  2. How are you currently taking this medication (dosage and times per day)?  Patient takes 1 daily  3. Are you having a reaction (difficulty breathing--STAT)?  No   4. What is your medication issue?  Patient's wife states while hospitalized the patient was advised to start taking 4 tablets daily. She would like to know if Dr. Stanford Breed agrees with this. Please advise.

## 2021-05-18 ENCOUNTER — Telehealth: Payer: Self-pay

## 2021-05-18 DIAGNOSIS — R112 Nausea with vomiting, unspecified: Secondary | ICD-10-CM

## 2021-05-18 DIAGNOSIS — Z792 Long term (current) use of antibiotics: Secondary | ICD-10-CM

## 2021-05-18 MED ORDER — ONDANSETRON HCL 4 MG PO TABS: 4.0000 mg | ORAL_TABLET | Freq: Three times a day (TID) | ORAL | 2 refills | Status: DC | PRN

## 2021-05-18 NOTE — Addendum Note (Signed)
Addended by: Prudencio Pair T on: 05/18/2021 03:24 PM   Modules accepted: Orders

## 2021-05-18 NOTE — Telephone Encounter (Signed)
Spoke with patient's wife, relayed that Zofran has been sent to the [facility] in [place].   Beryle Flock, RN

## 2021-05-18 NOTE — Telephone Encounter (Signed)
Patient called complaining of nausea/vomiting which he attributes to his antibiotic. Symptoms started Wednesday. Denies abdominal cramping, fever, chills. Will route to provider.   Beryle Flock, RN

## 2021-05-22 ENCOUNTER — Telehealth: Payer: Self-pay | Admitting: Family Medicine

## 2021-05-22 NOTE — Telephone Encounter (Signed)
Pt's wife states pt was in the hospital to get his shoulder replacement removed and his blood sugar was high at the hospital, and when he left it has dropped to in the 50s. She was wondering if he needs to stop taking some of his medications.She was triaged to go over symptoms incase he needed to seek immediate care.

## 2021-05-22 NOTE — Telephone Encounter (Signed)
Nurse Assessment Nurse: Sheppard Plumber, RN, Estill Bamberg Date/Time (Eastern Time): 05/22/2021 8:49:15 AM Confirm and document reason for call. If symptomatic, describe symptoms. ---caller states her husband's blood sugar was 55. has not check blood sugar this morning. has not been feeling well. has not been eating well. saw GI doctor for it and was given nausea meds. advised wife to have patient check sugar now. right now is 183 Does the patient have any new or worsening symptoms? ---Yes Will a triage be completed? ---Yes Related visit to physician within the last 2 weeks? ---Yes Does the PT have any chronic conditions? (i.e. diabetes, asthma, this includes High risk factors for pregnancy, etc.) ---Yes List chronic conditions. ---DM on oral meds Is this a behavioral health or substance abuse call? ---No Guidelines Guideline Title Affirmed Question Affirmed Notes Nurse Date/Time (Eastern Time) Diabetes - High Blood Sugar Blood glucose 70-240 mg/dL (3.9 -13.3 mmol/L) Humfleet, RN, Estill Bamberg 05/22/2021 8:54:53 AM Disp. Time Eilene Ghazi Time) Disposition Final User 05/22/2021 8:48:21 AM Send to Urgent Clovis Cao, [place] 05/22/2021 8:58:27 AM Call PCP within 24 Hours Yes Humfleet, RN, Estill Bamberg PLEASE NOTE: All timestamps contained within this report are represented as [place] Standard Time. CONFIDENTIALTY NOTICE: This fax transmission is intended only for the addressee. It contains information that is legally privileged, confidential or otherwise protected from use or disclosure. If you are not the intended recipient, you are strictly prohibited from reviewing, disclosing, copying using or disseminating any of this information or taking any action in reliance on or regarding this information. If you have received this fax in error, please notify us immediately by telephone so that we can arrange for its return to [place]. Phone: [phone], Toll-Free: [phone], Fax: [phone] Page: 2 of 2 Call  Id: [id number] Disposition Overriden: Home Care Override Reason: Patient's symptoms need a higher level of care Caller Disagree/Comply Comply Caller Understands Yes PreDisposition Call Doctor Care Advice Given Per Guideline CALL PCP WITHIN 24 HOURS: * You need to discuss this with your doctor (or NP/PA) within the next 24 hours. * IF OFFICE WILL BE OPEN: Call the office when it opens tomorrow morning. CARE ADVICE given per Diabetes - High Blood Sugar (Adult) guideline. CALL BACK IF: * You have more questions. * You become worse Comments User: Rozelle Logan, RN Date/Time Eilene Ghazi Time): 05/22/2021 8:59:52 AM patient's blood sugar has been going low in the mornings,. this morning was 183. education needed on Diabetes. patient states he checks blood sugar every other day Referrals REFERRED TO PCP OFFICE

## 2021-05-23 ENCOUNTER — Ambulatory Visit (INDEPENDENT_AMBULATORY_CARE_PROVIDER_SITE_OTHER): Payer: Medicare HMO | Admitting: Medical

## 2021-05-23 VITALS — BP 100/54 | HR 69 | Resp 18 | Ht 65.0 in | Wt 147.6 lb

## 2021-05-23 DIAGNOSIS — E875 Hyperkalemia: Secondary | ICD-10-CM | POA: Diagnosis not present

## 2021-05-23 DIAGNOSIS — M25511 Pain in right shoulder: Secondary | ICD-10-CM

## 2021-05-23 DIAGNOSIS — Z96619 Presence of unspecified artificial shoulder joint: Secondary | ICD-10-CM | POA: Diagnosis not present

## 2021-05-23 DIAGNOSIS — R5383 Other fatigue: Secondary | ICD-10-CM

## 2021-05-23 DIAGNOSIS — T8459XS Infection and inflammatory reaction due to other internal joint prosthesis, sequela: Secondary | ICD-10-CM

## 2021-05-23 DIAGNOSIS — D649 Anemia, unspecified: Secondary | ICD-10-CM | POA: Diagnosis not present

## 2021-05-23 DIAGNOSIS — G8929 Other chronic pain: Secondary | ICD-10-CM

## 2021-05-23 LAB — FUNGAL ORGANISM REFLEX

## 2021-05-23 LAB — COMPREHENSIVE METABOLIC PANEL
ALT: 8 U/L (ref 0–53)
AST: 15 U/L (ref 0–37)
Albumin: 3.8 g/dL (ref 3.5–5.2)
Alkaline Phosphatase: 61 U/L (ref 39–117)
BUN: 31 mg/dL — ABNORMAL HIGH (ref 6–23)
CO2: 28 mEq/L (ref 19–32)
Calcium: 9.6 mg/dL (ref 8.4–10.5)
Chloride: 101 mEq/L (ref 96–112)
Creatinine, Ser: 1.89 mg/dL — ABNORMAL HIGH (ref 0.40–1.50)
GFR: 31.77 mL/min — ABNORMAL LOW (ref >60.00)
Glucose, Bld: 123 mg/dL — ABNORMAL HIGH (ref 70–99)
Potassium: 5.4 mEq/L — ABNORMAL HIGH (ref 3.5–5.1)
Sodium: 138 mEq/L (ref 135–145)
Total Bilirubin: 0.4 mg/dL (ref 0.2–1.2)
Total Protein: 6.4 g/dL (ref 6.0–8.3)

## 2021-05-23 LAB — TSH: TSH: 2.97 u[IU]/mL (ref 0.35–5.50)

## 2021-05-23 LAB — CBC WITH DIFFERENTIAL/PLATELET
Basophils Absolute: 0 10*3/uL (ref 0.0–0.1)
Basophils Relative: 0.5 % (ref 0.0–3.0)
Eosinophils Absolute: 0.1 10*3/uL (ref 0.0–0.7)
Eosinophils Relative: 1.2 % (ref 0.0–5.0)
HCT: 29.8 % — ABNORMAL LOW (ref 39.0–52.0)
Hemoglobin: 9.8 g/dL — ABNORMAL LOW (ref 13.0–17.0)
Lymphocytes Relative: 21.9 % (ref 12.0–46.0)
Lymphs Abs: 1.9 10*3/uL (ref 0.7–4.0)
MCHC: 32.9 g/dL (ref 30.0–36.0)
MCV: 94.7 fl (ref 78.0–100.0)
Monocytes Absolute: 0.8 10*3/uL (ref 0.1–1.0)
Monocytes Relative: 9.3 % (ref 3.0–12.0)
Neutro Abs: 5.8 10*3/uL (ref 1.4–7.7)
Neutrophils Relative %: 67.1 % (ref 43.0–77.0)
Platelets: 181 10*3/uL (ref 150.0–400.0)
RBC: 3.14 Mil/uL — ABNORMAL LOW (ref 4.22–5.81)
RDW: 14.3 % (ref 11.5–15.5)
WBC: 8.7 10*3/uL (ref 4.0–10.5)

## 2021-05-23 LAB — FUNGUS CULTURE WITH STAIN

## 2021-05-23 LAB — C-REACTIVE PROTEIN: CRP: <1 mg/dL (ref 0.5–20.0)

## 2021-05-23 LAB — SEDIMENTATION RATE: Sed Rate: 19 mm/hr (ref 0–20)

## 2021-05-23 LAB — T4, FREE: Free T4: 0.88 ng/dL (ref 0.60–1.60)

## 2021-05-23 LAB — FUNGUS CULTURE RESULT

## 2021-05-23 NOTE — Progress Notes (Signed)
Subjective:    Patient ID: Hayden Garza, male    DOB: 26-Dec-1932, 87 y.o.   MRN: [id number]  HPI  Pt in for evaluation on recent low sugar of 55 yesterday and 50 this morning.  Pt did not get message  Have him drop his Glimeperide in 1/2 to 2 mg per dose and he might benefit with a consultation with pharmacist for CCM services. He sees Percell Miller tomorrow    Pt is on 4 mg of glimepiride. He is also on metformin 500 mg 2 tab bid and actos 15 mg daily.  Pt states he had shoulder surgery 12 years ago. He had infection and subsequent removal of infected right shoulder arthroplasty, implantation of cement spacer with Justice Britain, MD. Pt is still on doxycline post surgery.  Pt states since surgery he lost his appetitie, has some daily nausea and very fatigued.   Pt not reporting any fever chills or sweats.   Review of Systems  Constitutional:  Positive for fatigue. Negative for chills and fever.  HENT:  Negative for congestion.   Respiratory:  Negative for cough, shortness of breath and wheezing.   Cardiovascular:  Negative for chest pain and palpitations.  Gastrointestinal:  Negative for abdominal pain, blood in stool and diarrhea.  Musculoskeletal:  Negative for back pain and joint swelling.  Hematological:  Negative for adenopathy. Does not bruise/bleed easily.   Past Medical History:  Diagnosis Date   Allergic rhinitis    Anemia    BPH (benign prostatic hyperplasia)    With obstruction/lower urinary tract symptoms. Takes cardura for prostate.    CAD (coronary artery disease)    a. 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 No pets. No dietary restrictions      Retired from truck driving, long haul   No cigarettes, alcohol to excess   Seat belts every time.    Social Determinants of Health   Financial Resource Strain: Not on file  Food Insecurity: Not on file  Transportation Needs: Not on file  Physical Activity: Not on file  Stress: Not on file  Social Connections: Not on file  Intimate Partner Violence: Not on file    Past Surgical History:  Procedure Laterality Date   APPENDECTOMY     BACK SURGERY     vertebral collapse in lower back had insturment, reports no metal   CARDIAC CATHETERIZATION N/A 05/02/2015   Procedure: Left Heart Cath and Coronary Angiography;  Surgeon: Lorretta Harp, MD;  Location: [facility];  Service: Cardiovascular;  Laterality: N/A;   CARDIAC CATHETERIZATION N/A 05/02/2015   Procedure: Coronary Stent Intervention;  Surgeon: Lorretta Harp, MD;  Location: [facility];  Service: Cardiovascular;  Laterality: N/A;   CORONARY ANGIOPLASTY WITH STENT PLACEMENT     JOINT REPLACEMENT Right    6 surgeries including total shoulder replacement roughly in [place] REVISION Right 04/20/2021   Procedure: Removal of infected right shoulder arthroplasty, implantation of cement spacer;  Surgeon: Justice Britain, MD;  Location: [facility];  Service: Orthopedics;  Laterality: Right;    Family History  Problem Relation Age of Onset   Heart disease Mother        chf   Heart attack Father    CAD  Father    Colitis Sister    Other Brother        head trauma   Heart disease Brother    Hyperlipidemia Brother    Hypertension Brother    Diabetes Brother    Kidney failure Brother    Diabetes Son     Allergies  Allergen Reactions   Lipitor [Atorvastatin]     Joint and muscle pain   Penicillins     swelling in joints    Current Outpatient Medications on File Prior to Visit  Medication Sig Dispense Refill   aspirin EC 81 MG tablet Take 4 tablets (325 mg total) by mouth daily. (Patient taking differently: Take 81 mg by mouth daily.)     cefadroxil (DURICEF) 500 MG capsule Take 2 capsules (1,000 mg total) by mouth 2 (two) times daily. 120 capsule 0   cefTRIAXone (ROCEPHIN) IVPB Inject 2 g into the vein daily. Indication:  R-shoulder PJI First Dose: Yes Last Day of Therapy:  05/31/21 Labs - Once weekly:  CBC/D and BMP, Labs - Every other week:  ESR and CRP Method of administration: IV Push Method of administration may be changed at the discretion of home infusion pharmacist based upon assessment of the patient and/or caregiver's ability to self-administer the medication ordered. 37 Units 0   chlorhexidine (PERIDEX) 0.12 % solution After brushing, rinse with one capful for one minute twice a day then spit out 473 mL 0   Cholecalciferol (VITAMIN D) 50 MCG (2000 UT) tablet Take 2,000 Units by mouth daily.     Coenzyme Q10 (COQ10) 100 MG CAPS Take 100 mg by mouth daily.     doxazosin (CARDURA) 8 MG tablet Take 1 tablet (8 mg total) by mouth at bedtime. 90 tablet 1   doxycycline (VIBRA-TABS) 100 MG tablet Take 1 tablet (100 mg total) by mouth 2 (two) times daily. 60 tablet 0   ferrous gluconate (FERGON) 240 (27 FE) MG tablet Take 240 mg by mouth daily.     finasteride (PROSCAR) 5 MG tablet TAKE 1 TABLET EVERY DAY 90 tablet 1   glimepiride (AMARYL) 4 MG tablet TAKE 1 TABLET EVERY DAY BEFORE BREAKFAST 90 tablet 1   lisinopril (ZESTRIL) 2.5 MG tablet Take 1 tablet (2.5 mg total) by mouth  daily. 90 tablet 2   metFORMIN (GLUCOPHAGE) 500 MG tablet TAKE 2 TABLETS TWICE DAILY WITH MEALS (Patient taking differently: Take 500 mg by mouth daily.) 360 tablet 0   metoprolol tartrate (LOPRESSOR) 25 MG tablet Take 1/2 tablets (12.5 mg total) by mouth 2 (two) times daily. (Patient taking differently: Take 25 mg by mouth daily.) 180 tablet 3   Multiple Vitamin (MULTIVITAMIN  WITH MINERALS) TABS tablet Take 1 tablet by mouth daily.     nitroGLYCERIN (NITROSTAT) 0.4 MG SL tablet PLACE 1 TABLET UNDER THE TONGUE EVERY 5 MINUTES AS NEEDED FOR CHESTPAIN 25 tablet 3   omeprazole (PRILOSEC) 40 MG capsule Take 1 capsule (40 mg total) by mouth daily. 90 capsule 1   ondansetron (ZOFRAN) 4 MG tablet Take 1 tablet (4 mg total) by mouth every 8 (eight) hours as needed for nausea or vomiting. 20 tablet 2   oxyCODONE-acetaminophen (PERCOCET) 5-325 MG tablet Take 1 tablet by mouth every 4 (four) hours as needed for severe pain (max 6 q). 12 tablet 0   pioglitazone (ACTOS) 15 MG tablet TAKE 1 TABLET EVERY DAY 90 tablet 1   rosuvastatin (CRESTOR) 5 MG tablet TAKE 1 TABLET EVERY DAY 90 tablet 1   traMADol (ULTRAM) 50 MG tablet Take 1 tablet (50 mg total) by mouth every 12 (twelve) hours as needed (pain). 30 tablet 0   traMADol (ULTRAM) 50 MG tablet Take 1 tablet by mouth at bedtime as needed for pain 30 tablet 0   zolpidem (AMBIEN) 10 MG tablet Take 1 tablet (10 mg total) by mouth at bedtime as needed for sleep. (Patient taking differently: Take 5-10 mg by mouth at bedtime.) 90 tablet 1   No current facility-administered medications on file prior to visit.    BP (!) 100/54    Pulse 69    Resp 18    Ht 5' 5" (1.651 m)    Wt 147 lb 9.6 oz (67 kg)    SpO2 97%    BMI 24.56 kg/m       Objective:   Physical Exam  General- No acute distress. Pleasant patient. Neck- Full range of motion, no jvd Lungs- Clear, even and unlabored. Heart- regular rate and rhythm. Neurologic- CNII- XII grossly intact.  Rt shoulder-  mild redness over the suture site. Tender to palpation.      Assessment & Plan:   Patient Instructions   Diabetes with A1c 1 month ago of 10.  Your previous infected shoulder might have promoted elevated blood sugar levels.  Recently having low sugars.  Your PCP wants you to decrease your Amaryl to 2 mg daily.  Can continue metformin and Actos at same dose.  Check your blood sugars fasting in the morning, 1 postmeal sugar check and additional check if you are feeling symptomatic as discussed.  Prefer that your sugars not be less than 90.  If your sugars are decreasing then would likely have you drop/stop Amaryl completely.  If with dose adjustment sugars are all of a sudden increasing and might need to increase Actos.  Would asked that you give [place] update on your blood sugar levels by this Friday or sooner if needed.  Recent right shoulder infection.  Right shoulder is little tender over anterior region where surgical site was.  We will get CBC, C-reactive protein and sed rate.  Continue doxycycline.  Fatigue and decreased appetite since surgery.  Will include metabolic panel and thyroid studies along with above labs.  Follow-up in 10 to 14 days with PCP or sooner if needed.               Mackie Pai, PA-C

## 2021-05-23 NOTE — Telephone Encounter (Signed)
Patient seeing Hayden Garza today

## 2021-05-23 NOTE — Patient Instructions (Addendum)
  Diabetes with A1c 1 month ago of 10.  Your previous infected shoulder might have promoted elevated blood sugar levels.  Recently having low sugars.  Your PCP wants you to decrease your Amaryl to 2 mg daily.  Can continue metformin and Actos at same dose.  Check your blood sugars fasting in the morning, 1 postmeal sugar check and additional check if you are feeling symptomatic as discussed.  Prefer that your sugars not be less than 90.  If your sugars are decreasing then would likely have you drop/stop Amaryl completely.  If with dose adjustment sugars are all of a sudden increasing and might need to increase Actos.  Would asked that you give [place] update on your blood sugar levels by this Friday or sooner if needed.  Recent right shoulder infection.  Right shoulder is little tender over anterior region where surgical site was.  We will get CBC, C-reactive protein and sed rate.  Continue doxycycline.  Fatigue and decreased appetite since surgery.  Will include metabolic panel and thyroid studies along with above labs.  Follow-up in 10 to 14 days with PCP or sooner if needed.

## 2021-05-24 ENCOUNTER — Other Ambulatory Visit: Payer: Self-pay

## 2021-05-24 ENCOUNTER — Other Ambulatory Visit (HOSPITAL_BASED_OUTPATIENT_CLINIC_OR_DEPARTMENT_OTHER): Payer: Self-pay

## 2021-05-24 DIAGNOSIS — R799 Abnormal finding of blood chemistry, unspecified: Secondary | ICD-10-CM

## 2021-05-24 DIAGNOSIS — D649 Anemia, unspecified: Secondary | ICD-10-CM

## 2021-05-24 DIAGNOSIS — E875 Hyperkalemia: Secondary | ICD-10-CM

## 2021-05-24 MED ORDER — SODIUM POLYSTYRENE SULFONATE 15 GM/60ML PO SUSP
15.0000 g | Freq: Every day | ORAL | 0 refills | Status: DC
  Filled 2021-05-24: qty 240, 4d supply, fill #0

## 2021-05-24 NOTE — Addendum Note (Signed)
Addended by: Anabel Halon on: 05/24/2021 12:36 PM   Modules accepted: Orders

## 2021-05-29 ENCOUNTER — Telehealth: Payer: Self-pay | Admitting: Family Medicine

## 2021-05-29 ENCOUNTER — Other Ambulatory Visit (INDEPENDENT_AMBULATORY_CARE_PROVIDER_SITE_OTHER): Payer: Medicare HMO

## 2021-05-29 DIAGNOSIS — D649 Anemia, unspecified: Secondary | ICD-10-CM | POA: Diagnosis not present

## 2021-05-29 DIAGNOSIS — R5383 Other fatigue: Secondary | ICD-10-CM | POA: Diagnosis not present

## 2021-05-29 DIAGNOSIS — E875 Hyperkalemia: Secondary | ICD-10-CM | POA: Diagnosis not present

## 2021-05-29 LAB — IRON: Iron: 60 ug/dL (ref 42–165)

## 2021-05-29 LAB — COMPREHENSIVE METABOLIC PANEL
ALT: 10 U/L (ref 0–53)
AST: 15 U/L (ref 0–37)
Albumin: 3.6 g/dL (ref 3.5–5.2)
Alkaline Phosphatase: 57 U/L (ref 39–117)
BUN: 21 mg/dL (ref 6–23)
CO2: 27 mEq/L (ref 19–32)
Calcium: 9.1 mg/dL (ref 8.4–10.5)
Chloride: 103 mEq/L (ref 96–112)
Creatinine, Ser: 1.62 mg/dL — ABNORMAL HIGH (ref 0.40–1.50)
GFR: 38.22 mL/min — ABNORMAL LOW (ref >60.00)
Glucose, Bld: 183 mg/dL — ABNORMAL HIGH (ref 70–99)
Potassium: 4.4 mEq/L (ref 3.5–5.1)
Sodium: 142 mEq/L (ref 135–145)
Total Bilirubin: 0.4 mg/dL (ref 0.2–1.2)
Total Protein: 6.2 g/dL (ref 6.0–8.3)

## 2021-05-29 LAB — CBC WITH DIFFERENTIAL/PLATELET
Basophils Absolute: 0.1 10*3/uL (ref 0.0–0.1)
Basophils Relative: 0.6 % (ref 0.0–3.0)
Eosinophils Absolute: 0.2 10*3/uL (ref 0.0–0.7)
Eosinophils Relative: 1.8 % (ref 0.0–5.0)
HCT: 29.1 % — ABNORMAL LOW (ref 39.0–52.0)
Hemoglobin: 9.6 g/dL — ABNORMAL LOW (ref 13.0–17.0)
Lymphocytes Relative: 22.5 % (ref 12.0–46.0)
Lymphs Abs: 2.1 10*3/uL (ref 0.7–4.0)
MCHC: 32.9 g/dL (ref 30.0–36.0)
MCV: 95 fl (ref 78.0–100.0)
Monocytes Absolute: 0.8 10*3/uL (ref 0.1–1.0)
Monocytes Relative: 8.6 % (ref 3.0–12.0)
Neutro Abs: 6.3 10*3/uL (ref 1.4–7.7)
Neutrophils Relative %: 66.5 % (ref 43.0–77.0)
Platelets: 164 10*3/uL (ref 150.0–400.0)
RBC: 3.07 Mil/uL — ABNORMAL LOW (ref 4.22–5.81)
RDW: 15 % (ref 11.5–15.5)
WBC: 9.5 10*3/uL (ref 4.0–10.5)

## 2021-05-29 NOTE — Telephone Encounter (Signed)
Pt came in office last week and saw Mackie Pai for Blood sugar issues, pt wanted to inform provider what is blood sugar level has been Dec 14  100 - Noon  165, Dec 15  70 - Noon  186, Dec 16  87- Noon  136, Dec 17  71 - Noon  165, Please advixe.

## 2021-05-31 NOTE — Telephone Encounter (Signed)
Glimepiride is a half tablet

## 2021-05-31 NOTE — Telephone Encounter (Signed)
Pt is taking metformin at noted in medication list but glimepiride

## 2021-06-01 ENCOUNTER — Ambulatory Visit: Payer: Medicare HMO | Admitting: Family Medicine

## 2021-06-12 LAB — ACID FAST CULTURE WITH REFLEXED SENSITIVITIES (MYCOBACTERIA)
Acid Fast Culture: NEGATIVE
Acid Fast Culture: NEGATIVE

## 2021-06-13 ENCOUNTER — Ambulatory Visit: Payer: Medicare HMO | Admitting: Family Medicine

## 2021-06-13 ENCOUNTER — Other Ambulatory Visit (HOSPITAL_BASED_OUTPATIENT_CLINIC_OR_DEPARTMENT_OTHER): Payer: Self-pay

## 2021-06-13 ENCOUNTER — Other Ambulatory Visit: Payer: Self-pay

## 2021-06-13 MED ORDER — DOXYCYCLINE HYCLATE 100 MG PO TABS
100.0000 mg | ORAL_TABLET | Freq: Two times a day (BID) | ORAL | 5 refills | Status: DC
  Filled 2021-06-13: qty 60, 30d supply, fill #0
  Filled 2021-07-11: qty 60, 30d supply, fill #1
  Filled 2021-08-11: qty 60, 30d supply, fill #2
  Filled 2021-09-18: qty 60, 30d supply, fill #3
  Filled 2021-10-19: qty 60, 30d supply, fill #4

## 2021-06-13 MED ORDER — DOXYCYCLINE HYCLATE 100 MG PO TABS
100.0000 mg | ORAL_TABLET | Freq: Two times a day (BID) | ORAL | 2 refills | Status: DC
  Filled 2021-06-13: qty 60, 30d supply, fill #0

## 2021-06-16 ENCOUNTER — Encounter: Payer: Self-pay | Admitting: Family Medicine

## 2021-06-16 ENCOUNTER — Ambulatory Visit: Payer: Medicare HMO | Admitting: Family Medicine

## 2021-06-16 ENCOUNTER — Ambulatory Visit (INDEPENDENT_AMBULATORY_CARE_PROVIDER_SITE_OTHER): Payer: Medicare HMO | Admitting: Family Medicine

## 2021-06-16 VITALS — BP 102/62 | HR 81 | Temp 97.8°F | Resp 16 | Ht 65.0 in | Wt 147.2 lb

## 2021-06-16 DIAGNOSIS — E119 Type 2 diabetes mellitus without complications: Secondary | ICD-10-CM | POA: Diagnosis not present

## 2021-06-16 DIAGNOSIS — Z8619 Personal history of other infectious and parasitic diseases: Secondary | ICD-10-CM

## 2021-06-16 DIAGNOSIS — D649 Anemia, unspecified: Secondary | ICD-10-CM | POA: Diagnosis not present

## 2021-06-16 DIAGNOSIS — N289 Disorder of kidney and ureter, unspecified: Secondary | ICD-10-CM | POA: Diagnosis not present

## 2021-06-16 DIAGNOSIS — M009 Pyogenic arthritis, unspecified: Secondary | ICD-10-CM | POA: Diagnosis not present

## 2021-06-16 DIAGNOSIS — I10 Essential (primary) hypertension: Secondary | ICD-10-CM | POA: Diagnosis not present

## 2021-06-16 DIAGNOSIS — E7800 Pure hypercholesterolemia, unspecified: Secondary | ICD-10-CM | POA: Diagnosis not present

## 2021-06-16 DIAGNOSIS — Z9889 Other specified postprocedural states: Secondary | ICD-10-CM | POA: Diagnosis not present

## 2021-06-16 NOTE — Patient Instructions (Addendum)
Take a multistrain probiotic daily  NOW probiotics can get at [facility] [place] [facility]  Consider Ginger tea or ginger capsules a couple times a day before meals   Centrum silver multivitamin  Add a fiber supplement such as Benefiber or Metamucil daily  Maintain a heart healthy diet such as DASH or MIND diet   Yellow Split Pea Powder Collagen Powder Whey Powder

## 2021-06-18 NOTE — Assessment & Plan Note (Signed)
Well controlled, no changes to meds. Encouraged heart healthy diet such as the DASH diet and exercise as tolerated.  °

## 2021-06-18 NOTE — Progress Notes (Signed)
Subjective:    Patient ID: Hayden Garza, male    DOB: 1932/09/15, 88 y.o.   MRN: [id number]  Chief Complaint  Patient presents with   Follow-up    HPI Patient is in today for follow up on chronic medical concerns. He is accompanied by his wife and they report overall he is doing better but still struggling with right shoulder pain and anorexia after an infection in the hardware of his right shoulder. No recurrent fever since removal of the hardware. Denies CP/palp/SOB/HA/congestion/GI or GU c/o. Taking meds as prescribed   Past Medical History:  Diagnosis Date   Allergic rhinitis    Anemia    BPH (benign prostatic hyperplasia)    With obstruction/lower urinary tract symptoms. Takes cardura for prostate.    CAD (coronary artery disease)    a. 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    Past Surgical History:  Procedure Laterality Date   APPENDECTOMY     BACK SURGERY     vertebral collapse in lower back had insturment, reports no metal   CARDIAC CATHETERIZATION N/A 05/02/2015   Procedure: Left Heart Cath and Coronary Angiography;  Surgeon: Lorretta Harp, MD;  Location: [facility];  Service: Cardiovascular;  Laterality: N/A;   CARDIAC CATHETERIZATION N/A 05/02/2015   Procedure: Coronary Stent Intervention;  Surgeon: Lorretta Harp, MD;  Location: [facility];  Service: Cardiovascular;  Laterality: N/A;   CORONARY ANGIOPLASTY WITH STENT PLACEMENT     JOINT REPLACEMENT Right    6 surgeries including total shoulder replacement roughly in [place] REVISION Right 04/20/2021   Procedure: Removal of infected right shoulder arthroplasty,  implantation of cement spacer;  Surgeon: Justice Britain, MD;  Location: [facility];  Service: Orthopedics;  Laterality: Right;    Family History  Problem Relation Age of Onset   Heart disease Mother        chf   Heart attack Father    CAD Father    Colitis Sister    Other Brother        head trauma   Heart disease Brother    Hyperlipidemia Brother    Hypertension Brother    Diabetes Brother    Kidney failure Brother    Diabetes Son     Social History   Socioeconomic History   Marital status: Married    Spouse name: Not on file   Number of children: 1   Years of education: Not on file   Highest education level: Not on file  Occupational History   Occupation: Retired  Tobacco Use   Smoking status: Never   Smokeless tobacco: Never  Substance and Sexual Activity   Alcohol use: No   Drug use: No   Sexual activity: Not on file  Other Topics Concern   Not on file  Social History Narrative   Lives in [place], [place] with wife. No pets. No dietary restrictions      Retired from truck driving, long haul   No cigarettes, alcohol to excess   Seat belts  every time.    Social Determinants of Health   Financial Resource Strain: Not on file  Food Insecurity: Not on file  Transportation Needs: Not on file  Physical Activity: Not on file  Stress: Not on file  Social Connections: Not on file  Intimate Partner Violence: Not on file    Outpatient Medications Prior to Visit  Medication Sig Dispense Refill   aspirin EC 81 MG tablet Take 4 tablets (325 mg total) by mouth daily. (Patient taking differently: Take 81 mg by mouth daily.)     chlorhexidine (PERIDEX) 0.12 % solution After brushing, rinse with one capful for one minute twice a day then spit out 473 mL 0   Cholecalciferol (VITAMIN D) 50 MCG (2000 UT) tablet Take 2,000 Units by mouth daily.     Coenzyme Q10 (COQ10) 100 MG CAPS Take 100 mg by mouth daily.     doxazosin (CARDURA) 8 MG tablet Take 1 tablet (8 mg total) by mouth  at bedtime. 90 tablet 1   doxycycline (VIBRA-TABS) 100 MG tablet Take 1 tablet (100 mg total) by mouth 2 (two) times daily. 60 tablet 5   ferrous gluconate (FERGON) 240 (27 FE) MG tablet Take 240 mg by mouth daily.     finasteride (PROSCAR) 5 MG tablet TAKE 1 TABLET EVERY DAY 90 tablet 1   glimepiride (AMARYL) 4 MG tablet TAKE 1 TABLET EVERY DAY BEFORE BREAKFAST 90 tablet 1   lisinopril (ZESTRIL) 2.5 MG tablet Take 1 tablet (2.5 mg total) by mouth daily. 90 tablet 2   metFORMIN (GLUCOPHAGE) 500 MG tablet TAKE 2 TABLETS TWICE DAILY WITH MEALS (Patient taking differently: Take 500 mg by mouth daily.) 360 tablet 0   metoprolol tartrate (LOPRESSOR) 25 MG tablet Take 1/2 tablets (12.5 mg total) by mouth 2 (two) times daily. (Patient taking differently: Take 25 mg by mouth daily.) 180 tablet 3   Multiple Vitamin (MULTIVITAMIN WITH MINERALS) TABS tablet Take 1 tablet by mouth daily.     nitroGLYCERIN (NITROSTAT) 0.4 MG SL tablet PLACE 1 TABLET UNDER THE TONGUE EVERY 5 MINUTES AS NEEDED FOR CHESTPAIN 25 tablet 3   omeprazole (PRILOSEC) 40 MG capsule Take 1 capsule (40 mg total) by mouth daily. 90 capsule 1   ondansetron (ZOFRAN) 4 MG tablet Take 1 tablet (4 mg total) by mouth every 8 (eight) hours as needed for nausea or vomiting. 20 tablet 2   oxyCODONE-acetaminophen (PERCOCET) 5-325 MG tablet Take 1 tablet by mouth every 4 (four) hours as needed for severe pain (max 6 q). 12 tablet 0   pioglitazone (ACTOS) 15 MG tablet TAKE 1 TABLET EVERY DAY 90 tablet 1   rosuvastatin (CRESTOR) 5 MG tablet TAKE 1 TABLET EVERY DAY 90 tablet 1   sodium polystyrene (KAYEXALATE) 15 GM/60ML suspension Take 60 milliliters (15 grams total) by mouth daily for 4 days. 240 mL 0   traMADol (ULTRAM) 50 MG tablet Take 1 tablet (50 mg total) by mouth every 12 (twelve) hours as needed (pain). 30 tablet 0   traMADol (ULTRAM) 50 MG tablet Take 1 tablet by mouth at bedtime as needed for pain 30 tablet 0   zolpidem (AMBIEN) 10 MG tablet  Take 1 tablet (10 mg total) by mouth at bedtime as needed for sleep. (Patient taking differently: Take 5-10 mg by mouth at bedtime.) 90 tablet 1   No facility-administered medications prior to visit.    Allergies  Allergen Reactions   Lipitor [Atorvastatin]     Joint and muscle pain  Penicillins     swelling in joints    Review of Systems  Constitutional:  Positive for malaise/fatigue. Negative for fever.  HENT:  Negative for congestion.   Eyes:  Negative for blurred vision.  Respiratory:  Negative for shortness of breath.   Cardiovascular:  Negative for chest pain, palpitations and leg swelling.  Gastrointestinal:  Negative for abdominal pain, blood in stool and nausea.  Genitourinary:  Negative for dysuria and frequency.  Musculoskeletal:  Positive for joint pain. Negative for falls.  Skin:  Negative for rash.  Neurological:  Negative for dizziness, loss of consciousness and headaches.  Endo/Heme/Allergies:  Negative for environmental allergies.  Psychiatric/Behavioral:  Negative for depression. The patient is not nervous/anxious.       Objective:    Physical Exam Constitutional:      General: He is not in acute distress.    Appearance: Normal appearance. He is not ill-appearing or toxic-appearing.  HENT:     Head: Normocephalic and atraumatic.     Right Ear: External ear normal.     Left Ear: External ear normal.     Nose: Nose normal.  Eyes:     General:        Right eye: No discharge.        Left eye: No discharge.  Cardiovascular:     Rate and Rhythm: Normal rate.  Pulmonary:     Effort: Pulmonary effort is normal.  Musculoskeletal:     Cervical back: Neck supple.  Skin:    General: Skin is dry.     Findings: No rash.     Comments: Mild redness and swelling around caudal aspect of scar at right shoulder.   Neurological:     Mental Status: He is alert and oriented to person, place, and time.  Psychiatric:        Behavior: Behavior normal.    BP 102/62     Pulse 81    Temp 97.8 F (36.6 C)    Resp 16    Ht 5\' 5"  (1.651 m)    Wt 147 lb 3.2 oz (66.8 kg)    SpO2 98%    BMI 24.50 kg/m  Wt Readings from Last 3 Encounters:  06/16/21 147 lb 3.2 oz (66.8 kg)  05/23/21 147 lb 9.6 oz (67 kg)  05/16/21 149 lb (67.6 kg)    Diabetic Foot Exam - Simple   No data filed    Lab Results  Component Value Date   WBC 9.5 05/29/2021   HGB 9.6 (L) 05/29/2021   HCT 29.1 (L) 05/29/2021   PLT 164.0 05/29/2021   GLUCOSE 183 (H) 05/29/2021   CHOL 109 12/22/2020   TRIG 64.0 12/22/2020   HDL 39.90 12/22/2020   LDLCALC 56 12/22/2020   ALT 10 05/29/2021   AST 15 05/29/2021   NA 142 05/29/2021   K 4.4 05/29/2021   CL 103 05/29/2021   CREATININE 1.62 (H) 05/29/2021   BUN 21 05/29/2021   CO2 27 05/29/2021   TSH 2.97 05/23/2021   INR 1.13 05/01/2015   HGBA1C 10.8 (H) 04/21/2021    Lab Results  Component Value Date   TSH 2.97 05/23/2021   Lab Results  Component Value Date   WBC 9.5 05/29/2021   HGB 9.6 (L) 05/29/2021   HCT 29.1 (L) 05/29/2021   MCV 95.0 05/29/2021   PLT 164.0 05/29/2021   Lab Results  Component Value Date   NA 142 05/29/2021   K 4.4 05/29/2021   CO2 27 05/29/2021  GLUCOSE 183 (H) 05/29/2021   BUN 21 05/29/2021   CREATININE 1.62 (H) 05/29/2021   BILITOT 0.4 05/29/2021   ALKPHOS 57 05/29/2021   AST 15 05/29/2021   ALT 10 05/29/2021   PROT 6.2 05/29/2021   ALBUMIN 3.6 05/29/2021   CALCIUM 9.1 05/29/2021   ANIONGAP 10 04/20/2021   GFR 38.22 (L) 05/29/2021   Lab Results  Component Value Date   CHOL 109 12/22/2020   Lab Results  Component Value Date   HDL 39.90 12/22/2020   Lab Results  Component Value Date   LDLCALC 56 12/22/2020   Lab Results  Component Value Date   TRIG 64.0 12/22/2020   Lab Results  Component Value Date   CHOLHDL 3 12/22/2020   Lab Results  Component Value Date   HGBA1C 10.8 (H) 04/21/2021       Assessment & Plan:   Problem List Items Addressed This Visit     Diabetes  mellitus type 2, noninsulin dependent ([place])    hgba1c acceptable, minimize simple carbs. Increase exercise as tolerated. Continue current meds      Relevant Orders   Hemoglobin A1c   Hyperlipidemia    Encourage heart healthy diet such as MIND or DASH diet, increase exercise, avoid trans fats, simple carbohydrates and processed foods, consider a krill or fish or flaxseed oil cap daily.       Relevant Orders   Lipid panel   Essential hypertension - Primary    Well controlled, no changes to meds. Encouraged heart healthy diet such as the DASH diet and exercise as tolerated.       Relevant Orders   TSH   Anemia    Increase leafy greens, consider increased lean red meat and using cast iron cookware. Continue to monitor, report any concerns.      Relevant Orders   CBC   Iron, TIBC and Ferritin Panel   Renal insufficiency    Hydrate and monitor      Relevant Orders   Comprehensive metabolic panel   Septic joint of right shoulder region [facility])    He has been struggling with ongoing shoulder pain after sudden onset infection of his 88 year old shoulder replacement. Even with the hardware removed his pain persists. No fevers or chills. He has to stay on his antibiotic indefinitely and he is hesitant but agrees to do so for now. Encouraged to take a probiotic daily.       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 Linarez maintain his aspirin EC, nitroGLYCERIN, rosuvastatin, chlorhexidine, metoprolol tartrate, lisinopril, doxazosin, omeprazole, zolpidem, metFORMIN, finasteride, glimepiride, pioglitazone, ferrous gluconate, CoQ10, multivitamin with minerals, Vitamin D, oxyCODONE-acetaminophen, traMADol, traMADol, ondansetron, sodium polystyrene, and doxycycline.  No orders of the defined types were placed in this encounter.    Penni Homans, MD

## 2021-06-18 NOTE — Assessment & Plan Note (Signed)
Encourage heart healthy diet such as MIND or DASH diet, increase exercise, avoid trans fats, simple carbohydrates and processed foods, consider a krill or fish or flaxseed oil cap daily.  °

## 2021-06-18 NOTE — Assessment & Plan Note (Signed)
Hydrate and monitor 

## 2021-06-18 NOTE — Assessment & Plan Note (Signed)
Increase leafy greens, consider increased lean red meat and using cast iron cookware. Continue to monitor, report any concerns 

## 2021-06-18 NOTE — Assessment & Plan Note (Signed)
He has been struggling with ongoing shoulder pain after sudden onset infection of his 88 year old shoulder replacement. Even with the hardware removed his pain persists. No fevers or chills. He has to stay on his antibiotic indefinitely and he is hesitant but agrees to do so for now. Encouraged to take a probiotic daily.

## 2021-06-18 NOTE — Assessment & Plan Note (Signed)
hgba1c acceptable, minimize simple carbs. Increase exercise as tolerated. Continue current meds 

## 2021-06-21 ENCOUNTER — Other Ambulatory Visit: Payer: Self-pay

## 2021-06-21 ENCOUNTER — Ambulatory Visit: Payer: Medicare HMO | Admitting: Internal Medicine

## 2021-06-21 VITALS — BP 121/75 | HR 71 | Temp 97.6°F | Wt 149.0 lb

## 2021-06-21 DIAGNOSIS — T8450XD Infection and inflammatory reaction due to unspecified internal joint prosthesis, subsequent encounter: Secondary | ICD-10-CM | POA: Diagnosis not present

## 2021-06-21 DIAGNOSIS — R7982 Elevated C-reactive protein (CRP): Secondary | ICD-10-CM | POA: Diagnosis not present

## 2021-06-21 NOTE — Patient Instructions (Signed)
Unclear what the discoloration is, almost like a bruise. Not infectious  Be aggressive with your bowel laxative, I would like to see good bowel movement within the next several days. Use enema of oral or suppository don't work   -if rapidly worsening nausea associated with abdominal pain should seek urgent care to make sure no bowel obstruction

## 2021-06-21 NOTE — Progress Notes (Signed)
[place] for Infectious Disease  Patient Active Problem List   Diagnosis Date Noted   Septic joint of right shoulder region [facility]) 04/20/2021   Insomnia 12/24/2020   Renal insufficiency 06/26/2020   BCC (basal cell carcinoma of skin) 06/26/2020   Right shoulder pain 06/26/2020   CAD (coronary artery disease) 06/15/2015   Essential hypertension 06/15/2015   Anemia 06/15/2015   NSTEMI (non-ST elevated myocardial infarction) ([place]) 04/30/2015   Dyspnea 05/22/2013   Hyperlipidemia 05/22/2013   Diabetes mellitus type 2, noninsulin dependent ([place]) 10/17/2009   ALLERGIC RHINITIS 10/17/2009   PULMONARY NODULE, RIGHT LOWER LOBE 10/17/2009      Subjective:    Patient ID: Hayden Garza, male    DOB: 1933-04-02, 88 y.o.   MRN: [id number]  Chief Complaint  Patient presents with   Follow-up    Nausea, constipated,     HPI:  Hayden Garza is a 88 y.o. 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 The tissue culture "normal flora" has p acnes growing with mixed flora (no strep group a/staph). The other 2 samples only grew p-acnes and was collected 1700. The one with mixed flora was collected 1730   Patient reported that his surgeon has no plan at this time to replace a new joint as his bone stock is minimal  No diarrhea, n/v He is actually constipated  He does report a little irritation at the end of his urination; no urgency. Has chronic frequency every few hours. No blood  12/06 id clinic f/u I had patient continuing on doxycycline only and stopped cefadroxil for gram negative coverage. The aspirate of his joint only grew p-acnes while the operative cx grew  both p-acnes and some other gram positive flora (only 1 sample) which I suspect is either lab contaminant No f/c Pain improving  06/21/21 id clinic f/u Moderate pain stable Nausea but able to eat Yellowish/red discoloration anterior of the right shoulder No f/c No diarrhea No other rash Last good bowel movement a week ago. Just a small one the morning of this visit No abdominal pain     Allergies  Allergen Reactions   Lipitor [Atorvastatin]     Joint and muscle pain   Penicillins     swelling in joints      Outpatient Medications Prior to Visit  Medication Sig Dispense Refill   aspirin EC 81 MG tablet Take 4 tablets (325 mg total) by mouth daily. (Patient taking differently: Take 81 mg by mouth daily.)     chlorhexidine (PERIDEX) 0.12 % solution After brushing, rinse with one capful for one minute twice a day then spit out 473 mL 0   Cholecalciferol (VITAMIN D) 50 MCG (2000 UT) tablet Take 2,000 Units by mouth daily.     Coenzyme Q10 (COQ10) 100 MG CAPS Take 100 mg by mouth daily.     doxazosin (CARDURA) 8 MG tablet Take 1 tablet (8 mg total) by mouth at bedtime. 90 tablet 1   doxycycline (VIBRA-TABS) 100 MG tablet Take 1 tablet (100 mg total) by mouth 2 (two) times daily. 60 tablet 5   ferrous gluconate (FERGON) 240 (  27 FE) MG tablet Take 240 mg by mouth daily.     finasteride (PROSCAR) 5 MG tablet TAKE 1 TABLET EVERY DAY 90 tablet 1   glimepiride (AMARYL) 4 MG tablet TAKE 1 TABLET EVERY DAY BEFORE BREAKFAST 90 tablet 1   lisinopril (ZESTRIL) 2.5 MG tablet Take 1 tablet (2.5 mg total) by mouth daily. 90 tablet 2   metFORMIN (GLUCOPHAGE) 500 MG tablet TAKE 2 TABLETS TWICE DAILY WITH MEALS (Patient taking differently: Take 500 mg by mouth daily.) 360 tablet 0   metoprolol tartrate (LOPRESSOR) 25 MG tablet Take 1/2 tablets (12.5 mg total) by mouth 2 (two) times daily. (Patient taking differently: Take 25 mg by mouth daily.) 180 tablet 3   Multiple Vitamin (MULTIVITAMIN WITH  MINERALS) TABS tablet Take 1 tablet by mouth daily.     nitroGLYCERIN (NITROSTAT) 0.4 MG SL tablet PLACE 1 TABLET UNDER THE TONGUE EVERY 5 MINUTES AS NEEDED FOR CHESTPAIN 25 tablet 3   omeprazole (PRILOSEC) 40 MG capsule Take 1 capsule (40 mg total) by mouth daily. 90 capsule 1   ondansetron (ZOFRAN) 4 MG tablet Take 1 tablet (4 mg total) by mouth every 8 (eight) hours as needed for nausea or vomiting. 20 tablet 2   oxyCODONE-acetaminophen (PERCOCET) 5-325 MG tablet Take 1 tablet by mouth every 4 (four) hours as needed for severe pain (max 6 q). 12 tablet 0   pioglitazone (ACTOS) 15 MG tablet TAKE 1 TABLET EVERY DAY 90 tablet 1   rosuvastatin (CRESTOR) 5 MG tablet TAKE 1 TABLET EVERY DAY 90 tablet 1   sodium polystyrene (KAYEXALATE) 15 GM/60ML suspension Take 60 milliliters (15 grams total) by mouth daily for 4 days. 240 mL 0   traMADol (ULTRAM) 50 MG tablet Take 1 tablet (50 mg total) by mouth every 12 (twelve) hours as needed (pain). 30 tablet 0   traMADol (ULTRAM) 50 MG tablet Take 1 tablet by mouth at bedtime as needed for pain 30 tablet 0   zolpidem (AMBIEN) 10 MG tablet Take 1 tablet (10 mg total) by mouth at bedtime as needed for sleep. (Patient taking differently: Take 5-10 mg by mouth at bedtime.) 90 tablet 1   No facility-administered medications prior to visit.     Social History   Socioeconomic History   Marital status: Married    Spouse name: Not on file   Number of children: 1   Years of education: Not on file   Highest education level: Not on file  Occupational History   Occupation: Retired  Tobacco Use   Smoking status: Never   Smokeless tobacco: Never  Substance and Sexual Activity   Alcohol use: No   Drug use: No   Sexual activity: Not on file  Other Topics Concern   Not on file  Social History Narrative   Lives in [place], [place] with wife. No pets. No dietary restrictions      Retired from truck driving, long haul   No cigarettes, alcohol to excess   Seat  belts every time.    Social Determinants of Health   Financial Resource Strain: Not on file  Food Insecurity: Not on file  Transportation Needs: Not on file  Physical Activity: Not on file  Stress: Not on file  Social Connections: Not on file  Intimate Partner Violence: Not on file      Review of Systems All other ros negative  Objective:    BP 121/75    Pulse 71    Temp 97.6 F (36.4 C) (  Oral)    Wt 149 lb (67.6 kg)    SpO2 100%    BMI 24.79 kg/m  Nursing note and vital signs reviewed.  Physical Exam General/constitutional: no distress, pleasant HEENT: Normocephalic, PER, Conj Clear, EOMI, Oropharynx clear Neck supple CV: rrr no mrg Lungs: clear to auscultation, normal respiratory effort Abd: Soft, Nontender Ext: no edema Skin: No Rash Neuro: nonfocal MSK: yellow/red discoloration anterior right shoulder             Labs: Lab Results  Component Value Date   WBC 9.5 05/29/2021   HGB 9.6 (L) 05/29/2021   HCT 29.1 (L) 05/29/2021   MCV 95.0 05/29/2021   PLT 164.0 74/14/2395   Last metabolic panel Lab Results  Component Value Date   GLUCOSE 183 (H) 05/29/2021   NA 142 05/29/2021   K 4.4 05/29/2021   CL 103 05/29/2021   CO2 27 05/29/2021   BUN 21 05/29/2021   CREATININE 1.62 (H) 05/29/2021   GFRNONAA 46 (L) 04/24/2021   CALCIUM 9.1 05/29/2021   PROT 6.2 05/29/2021   ALBUMIN 3.6 05/29/2021   BILITOT 0.4 05/29/2021   ALKPHOS 57 05/29/2021   AST 15 05/29/2021   ALT 10 05/29/2021   ANIONGAP 10 04/20/2021    Micro:  Serology:  Imaging:  Assessment & Plan:   Problem List Items Addressed This Visit   None   No orders of the defined types were placed in this encounter.    88 year old male with complicated history of right shoulder interventions started in the 1990s,  underwent right shoulder reverse arthroplasty 3202 complicated by right should pain which was worked up in 2015 admitted for right shoulder PJI.  He had presented to  orthopedic clinic for a 3 cm mass over the right shoulder enlarging over the past couple of days.  s/p right shoulder PJI SP hardware removal and cement placement on 04/20/21     11/09 cx p-acnes but one plate with gpc/gpr. Suspect contaminant gpc/gpr (not worked up but no staph/strep). Aspirate cx only p-acnes  12/06 assessment Patient had continued only doxycycline since 12/01 He is doing well  His surgeon did mention there was some retained cement that couldn't be removed and we had agreed on continue indefinite antibiotics or at least 6-12 months. At this time there is no plan for a new shoulder surgery as there is no bone stock for that   06/21/21 assessment Unclear what the discoloration is, almost like a bruise. Not cellulitic Clinically stable on doxycycline suppression He is to see his orthopedic surgeon today Constipated making nausea/abx side effect worse?   -advise aggressive bowel laxative -if rapidly worsening nausea associated with abdominal pain should seek urgent care to make sure no bowel obstruction -crp today -f/u 6-8 weeks -if increased pain and new coloration change/worsening right shoulder should let orthopedic surgeon and I know about   Follow-up: No follow-ups on file.    Jabier Mutton, [place] [facility] [phone]  pager   [phone] cell 06/21/2021, 8:55 AM

## 2021-06-22 ENCOUNTER — Other Ambulatory Visit (HOSPITAL_BASED_OUTPATIENT_CLINIC_OR_DEPARTMENT_OTHER): Payer: Self-pay

## 2021-06-22 LAB — HEPATIC FUNCTION PANEL
AG Ratio: 1.6 (calc) (ref 1.0–2.5)
ALT: 11 U/L (ref 9–46)
AST: 17 U/L (ref 10–35)
Albumin: 3.8 g/dL (ref 3.6–5.1)
Alkaline phosphatase (APISO): 51 U/L (ref 35–144)
Bilirubin, Direct: 0.1 mg/dL (ref 0.0–0.2)
Globulin: 2.4 g/dL (calc) (ref 1.9–3.7)
Indirect Bilirubin: 0.3 mg/dL (calc) (ref 0.2–1.2)
Total Bilirubin: 0.4 mg/dL (ref 0.2–1.2)
Total Protein: 6.2 g/dL (ref 6.1–8.1)

## 2021-06-22 LAB — C-REACTIVE PROTEIN: CRP: 2 mg/L (ref <8.0)

## 2021-06-22 MED ORDER — TRAMADOL HCL 50 MG PO TABS
ORAL_TABLET | ORAL | 0 refills | Status: DC
  Filled 2021-06-22: qty 30, 8d supply, fill #0

## 2021-06-26 LAB — ACID FAST CULTURE WITH REFLEXED SENSITIVITIES (MYCOBACTERIA): Acid Fast Culture: NEGATIVE

## 2021-07-11 ENCOUNTER — Other Ambulatory Visit: Payer: Self-pay | Admitting: Internal Medicine

## 2021-07-11 ENCOUNTER — Other Ambulatory Visit (HOSPITAL_BASED_OUTPATIENT_CLINIC_OR_DEPARTMENT_OTHER): Payer: Self-pay

## 2021-07-12 ENCOUNTER — Other Ambulatory Visit: Payer: Self-pay | Admitting: Family Medicine

## 2021-07-12 DIAGNOSIS — Z86008 Personal history of in-situ neoplasm of other site: Secondary | ICD-10-CM | POA: Diagnosis not present

## 2021-07-12 DIAGNOSIS — L570 Actinic keratosis: Secondary | ICD-10-CM | POA: Diagnosis not present

## 2021-07-12 DIAGNOSIS — Z85828 Personal history of other malignant neoplasm of skin: Secondary | ICD-10-CM | POA: Diagnosis not present

## 2021-07-13 ENCOUNTER — Other Ambulatory Visit (HOSPITAL_BASED_OUTPATIENT_CLINIC_OR_DEPARTMENT_OTHER): Payer: Self-pay

## 2021-07-14 ENCOUNTER — Other Ambulatory Visit (HOSPITAL_BASED_OUTPATIENT_CLINIC_OR_DEPARTMENT_OTHER): Payer: Self-pay

## 2021-07-17 ENCOUNTER — Other Ambulatory Visit (HOSPITAL_BASED_OUTPATIENT_CLINIC_OR_DEPARTMENT_OTHER): Payer: Self-pay

## 2021-07-24 ENCOUNTER — Ambulatory Visit (INDEPENDENT_AMBULATORY_CARE_PROVIDER_SITE_OTHER): Payer: Medicare HMO

## 2021-07-24 ENCOUNTER — Other Ambulatory Visit (INDEPENDENT_AMBULATORY_CARE_PROVIDER_SITE_OTHER): Payer: Medicare HMO

## 2021-07-24 ENCOUNTER — Other Ambulatory Visit (HOSPITAL_BASED_OUTPATIENT_CLINIC_OR_DEPARTMENT_OTHER): Payer: Self-pay

## 2021-07-24 VITALS — Ht 65.0 in | Wt 149.0 lb

## 2021-07-24 DIAGNOSIS — D649 Anemia, unspecified: Secondary | ICD-10-CM

## 2021-07-24 DIAGNOSIS — E119 Type 2 diabetes mellitus without complications: Secondary | ICD-10-CM

## 2021-07-24 DIAGNOSIS — E7800 Pure hypercholesterolemia, unspecified: Secondary | ICD-10-CM

## 2021-07-24 DIAGNOSIS — Z8619 Personal history of other infectious and parasitic diseases: Secondary | ICD-10-CM | POA: Diagnosis not present

## 2021-07-24 DIAGNOSIS — Z9889 Other specified postprocedural states: Secondary | ICD-10-CM | POA: Diagnosis not present

## 2021-07-24 DIAGNOSIS — N289 Disorder of kidney and ureter, unspecified: Secondary | ICD-10-CM | POA: Diagnosis not present

## 2021-07-24 DIAGNOSIS — I10 Essential (primary) hypertension: Secondary | ICD-10-CM | POA: Diagnosis not present

## 2021-07-24 DIAGNOSIS — Z0000 Encounter for general adult medical examination without abnormal findings: Secondary | ICD-10-CM

## 2021-07-24 LAB — HEMOGLOBIN A1C: Hgb A1c MFr Bld: 7.4 % — ABNORMAL HIGH (ref 4.6–6.5)

## 2021-07-24 LAB — SEDIMENTATION RATE: Sed Rate: 22 mm/hr — ABNORMAL HIGH (ref 0–20)

## 2021-07-24 LAB — CBC
HCT: 32.3 % — ABNORMAL LOW (ref 39.0–52.0)
Hemoglobin: 10.7 g/dL — ABNORMAL LOW (ref 13.0–17.0)
MCHC: 33.1 g/dL (ref 30.0–36.0)
MCV: 94.9 fl (ref 78.0–100.0)
Platelets: 164 10*3/uL (ref 150.0–400.0)
RBC: 3.4 Mil/uL — ABNORMAL LOW (ref 4.22–5.81)
RDW: 16.2 % — ABNORMAL HIGH (ref 11.5–15.5)
WBC: 6.9 10*3/uL (ref 4.0–10.5)

## 2021-07-24 LAB — TSH: TSH: 3.76 u[IU]/mL (ref 0.35–5.50)

## 2021-07-24 NOTE — Progress Notes (Signed)
Subjective:   Hayden Garza is a 88 y.o. male who presents for an Initial Medicare Annual Wellness Visit.  I connected with [place] today by telephone and verified that I am speaking with the correct person using two identifiers. Location patient: home Location provider: work Persons participating in the virtual visit: patient, Marine scientist.    I discussed the limitations, risks, security and privacy concerns of performing an evaluation and management service by telephone and the availability of in person appointments. I also discussed with the patient that there may be a patient responsible charge related to this service. The patient expressed understanding and verbally consented to this telephonic visit.    Interactive audio and video telecommunications were attempted between this provider and patient, however failed, due to patient having technical difficulties OR patient did not have access to video capability.  We continued and completed visit with audio only.  Some vital signs may be absent or patient reported.   Time Spent with patient on telephone encounter: 20 minutes   Review of Systems     Cardiac Risk Factors include: advanced age (>65men, >18 women);male gender;hypertension;diabetes mellitus;dyslipidemia     Objective:    Today's Vitals   07/24/21 1141  Weight: 149 lb (67.6 kg)  Height: 5\' 5"  (1.651 m)  PainSc: 5    Body mass index is 24.79 kg/m.  Advanced Directives 07/24/2021 04/20/2021 04/30/2015 11/30/2011  Does Patient Have a Medical Advance Directive? Yes Yes No Patient has advance directive, copy not in chart  Type of Advance Directive [place];Living will [place];Living will - [place]  Does patient want to make changes to medical advance directive? - No - Patient declined - -  Copy of [place] in Chart? No - copy requested No - copy requested - -  Would patient like information on  creating a medical advance directive? - - No - patient declined information -  Pre-existing out of facility DNR order (yellow form or pink MOST form) - - - No    Current Medications (verified) Outpatient Encounter Medications as of 07/24/2021  Medication Sig   aspirin EC 81 MG tablet Take 4 tablets (325 mg total) by mouth daily. (Patient taking differently: Take 81 mg by mouth daily.)   chlorhexidine (PERIDEX) 0.12 % solution After brushing, rinse with one capful for one minute twice a day then spit out   Cholecalciferol (VITAMIN D) 50 MCG (2000 UT) tablet Take 2,000 Units by mouth daily.   Coenzyme Q10 (COQ10) 100 MG CAPS Take 100 mg by mouth daily.   doxazosin (CARDURA) 8 MG tablet TAKE 1 TABLET AT BEDTIME   doxycycline (VIBRA-TABS) 100 MG tablet Take 1 tablet (100 mg total) by mouth 2 (two) times daily.   ferrous gluconate (FERGON) 240 (27 FE) MG tablet Take 240 mg by mouth daily.   finasteride (PROSCAR) 5 MG tablet TAKE 1 TABLET EVERY DAY   glimepiride (AMARYL) 4 MG tablet TAKE 1 TABLET EVERY DAY BEFORE BREAKFAST   lisinopril (ZESTRIL) 2.5 MG tablet Take 1 tablet (2.5 mg total) by mouth daily.   metFORMIN (GLUCOPHAGE) 500 MG tablet TAKE 2 TABLETS TWICE DAILY WITH MEALS (Patient taking differently: Take 500 mg by mouth daily.)   metoprolol tartrate (LOPRESSOR) 25 MG tablet Take 1/2 tablets (12.5 mg total) by mouth 2 (two) times daily. (Patient taking differently: Take 25 mg by mouth daily.)   Multiple Vitamin (MULTIVITAMIN WITH MINERALS) TABS tablet Take 1 tablet by mouth  daily.   omeprazole (PRILOSEC) 40 MG capsule TAKE 1 CAPSULE EVERY DAY   ondansetron (ZOFRAN) 4 MG tablet TAKE 1 TABLET BY MOUTH EVERY 8 HOURS AS NEEDED FOR NAUSEA OR VOMITING   oxyCODONE-acetaminophen (PERCOCET) 5-325 MG tablet Take 1 tablet by mouth every 4 (four) hours as needed for severe pain (max 6 q).   pioglitazone (ACTOS) 15 MG tablet TAKE 1 TABLET EVERY DAY   rosuvastatin (CRESTOR) 5 MG tablet TAKE 1 TABLET EVERY  DAY   sodium polystyrene (KAYEXALATE) 15 GM/60ML suspension Take 60 milliliters (15 grams total) by mouth daily for 4 days.   traMADol (ULTRAM) 50 MG tablet Take 1 tablet (50 mg total) by mouth every 12 (twelve) hours as needed (pain).   traMADol (ULTRAM) 50 MG tablet Take 1 tablet by mouth at bedtime as needed for pain   traMADol (ULTRAM) 50 MG tablet Take 1 tablet by mouth every 6 hours as needed for pain   nitroGLYCERIN (NITROSTAT) 0.4 MG SL tablet PLACE 1 TABLET UNDER THE TONGUE EVERY 5 MINUTES AS NEEDED FOR CHESTPAIN   zolpidem (AMBIEN) 10 MG tablet Take 1 tablet (10 mg total) by mouth at bedtime as needed for sleep. (Patient taking differently: Take 5-10 mg by mouth at bedtime.)   No facility-administered encounter medications on file as of 07/24/2021.    Allergies (verified) Lipitor [atorvastatin] and Penicillins   History: Past Medical History:  Diagnosis Date   Allergic rhinitis    Anemia    BPH (benign prostatic hyperplasia)    With obstruction/lower urinary tract symptoms. Takes cardura for prostate.    CAD (coronary artery disease)    a. 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   Past Surgical History:  Procedure Laterality Date   APPENDECTOMY     BACK SURGERY     vertebral collapse in lower back had insturment, reports no metal   CARDIAC CATHETERIZATION N/A 05/02/2015   Procedure: Left Heart Cath and Coronary Angiography;  Surgeon: Lorretta Harp, MD;  Location: [facility];  Service: Cardiovascular;  Laterality: N/A;   CARDIAC CATHETERIZATION N/A 05/02/2015   Procedure: Coronary Stent Intervention;  Surgeon: Lorretta Harp, MD;  Location: [facility];  Service: Cardiovascular;   Laterality: N/A;   CORONARY ANGIOPLASTY WITH STENT PLACEMENT     JOINT REPLACEMENT Right    6 surgeries including total shoulder replacement roughly in [place] REVISION Right 04/20/2021   Procedure: Removal of infected right shoulder arthroplasty, implantation of cement spacer;  Surgeon: Justice Britain, MD;  Location: [facility];  Service: Orthopedics;  Laterality: Right;   Family History  Problem Relation Age of Onset   Heart disease Mother        chf   Heart attack Father    CAD Father    Colitis Sister    Other Brother        head trauma   Heart disease Brother    Hyperlipidemia Brother    Hypertension Brother    Diabetes Brother    Kidney failure Brother    Diabetes Son  Social History   Socioeconomic History   Marital status: Married    Spouse name: Not on file   Number of children: 1   Years of education: Not on file   Highest education level: Not on file  Occupational History   Occupation: Retired  Tobacco Use   Smoking status: Never   Smokeless tobacco: Never  Substance and Sexual Activity   Alcohol use: No   Drug use: No   Sexual activity: Not on file  Other Topics Concern   Not on file  Social History Narrative   Lives in [place], [place] with wife. No pets. No dietary restrictions      Retired from truck driving, long haul   No cigarettes, alcohol to excess   Seat belts every time.    Social Determinants of Health   Financial Resource Strain: Low Risk    Difficulty of Paying Living Expenses: Not hard at all  Food Insecurity: No Food Insecurity   Worried About Charity fundraiser in the Last Year: Never true   [facility] in the Last Year: Never true  Transportation Needs: No Transportation Needs   Lack of Transportation (Medical): No   Lack of Transportation (Non-Medical): No  Physical Activity: Inactive   Days of Exercise per Week: 0 days   Minutes of Exercise per Session: 0 min   Stress: No Stress Concern Present   Feeling of Stress : Not at all  Social Connections: Moderately Integrated   Frequency of Communication with Friends and Family: More than three times a week   Frequency of Social Gatherings with Friends and Family: More than three times a week   Attends Religious Services: More than 4 times per year   Active Member of [employer] or Organizations: No   Attends Music therapist: Never   Marital Status: Married    Tobacco Counseling Counseling given: Not Answered   Clinical Intake:  Pre-visit preparation completed: Yes  Pain : 0-10 Pain Score: 5  Pain Type: Chronic pain Pain Location: Shoulder Pain Orientation: Right Pain Onset: More than a month ago Pain Frequency: Constant     BMI - recorded: 24.79 Nutritional Status: BMI of 19-24  Normal Nutritional Risks: None Diabetes: Yes CBG done?: No Did pt. bring in CBG monitor from home?: No (phone visit)  How often do you need to have someone help you when you read instructions, pamphlets, or other written materials from your doctor or pharmacy?: 1 - Never  Diabetes:  Is the patient diabetic?  Yes  If diabetic, was a CBG obtained today?  No  Did the patient bring in their glucometer from home?  No  How often do you monitor your CBG's? Once per week .   Financial Strains and Diabetes Management:  Are you having any financial strains with the device, your supplies or your medication? No .  Does the patient want to be seen by Chronic Care Management for management of their diabetes?  No  Would the patient like to be referred to a Nutritionist or for Diabetic Management?  No   Diabetic Exams:  Diabetic Eye Exam: . Overdue for diabetic eye exam. Pt has been advised about the importance in completing this exam.  Diabetic Foot Exam:  Pt has been advised about the importance in completing this exam. To be completed by PCP   Interpreter Needed?: No  Information entered by ::  Caroleen Hamman LPN   Activities of Daily Living In your present state  of health, do you have any difficulty performing the following activities: 07/24/2021 04/20/2021  Hearing? N N  Vision? N N  Difficulty concentrating or making decisions? N N  Walking or climbing stairs? Y N  Comment stairs -  Dressing or bathing? N N  Doing errands, shopping? N N  Preparing Food and eating ? N -  Using the Toilet? N -  In the past six months, have you accidently leaked urine? N -  Do you have problems with loss of bowel control? N -  Managing your Medications? N -  Managing your Finances? N -  Housekeeping or managing your Housekeeping? N -  Some recent data might be hidden    Patient Care Team: Mosie Lukes, MD as PCP - General (Family Medicine)  Indicate any recent Medical Services you may have received from other than [facility] providers in the past year (date may be approximate).     Assessment:   This is a routine wellness examination for [place].  Hearing/Vision screen Hearing Screening - Comments:: No issues Vision Screening - Comments:: Last eye exam-1-2 years ago  Dietary issues and exercise activities discussed: Current Exercise Habits: The patient does not participate in regular exercise at present   Goals Addressed             This Visit's Progress    Patient Stated       Maintain health & independence       Depression Screen PHQ 2/9 Scores 07/24/2021 05/16/2021 05/02/2021 06/24/2020  PHQ - 2 Score 0 1 0 0    Fall Risk Fall Risk  07/24/2021 05/16/2021 05/02/2021 06/24/2020  Falls in the past year? 0 0 0 0  Number falls in past yr: 0 - - -  Injury with Fall? 0 - - -  Risk for fall due to : - - Impaired balance/gait;Orthopedic patient -  Follow up Falls prevention discussed - Falls evaluation completed;Education provided -    FALL RISK PREVENTION PERTAINING TO THE HOME:  Any stairs in or around the home? Yes  If so, are there any without handrails? No  Home free  of loose throw rugs in walkways, pet beds, electrical cords, etc? Yes  Adequate lighting in your home to reduce risk of falls? Yes   ASSISTIVE DEVICES UTILIZED TO PREVENT FALLS:  Life alert? No  Use of a cane, walker or w/c? No  Grab bars in the bathroom? Yes  Shower chair or bench in shower? No  Elevated toilet seat or a handicapped toilet? No   TIMED UP AND GO:  Was the test performed? No .phone visit    Cognitive Function:Normal cognitive status assessed by this Nurse Health Advisor. No abnormalities found.          Immunizations Immunization History  Administered Date(s) Administered   Fluad Quad(high Dose 65+) 03/29/2021   H1N1 06/08/2008   Influenza Split 03/08/2008, 03/02/2009, 02/22/2010, 03/01/2011   Influenza, High Dose Seasonal PF 03/28/2012, 04/02/2013, 03/05/2014, 03/08/2015, 02/27/2016, 03/13/2017, 03/07/2018, 02/19/2019, 03/16/2020   PFIZER(Purple Top)SARS-COV-2 Vaccination 07/01/2019, 07/22/2019, 03/16/2020   Pneumococcal Conjugate-13 03/05/2014   Pneumococcal Polysaccharide-23 04/21/2001, 11/09/2016   Td 01/26/2005   Tdap 12/22/2020   Zoster, Live 03/26/2007    TDAP status: Up to date  Flu Vaccine status: Up to date  Pneumococcal vaccine status: Up to date  Covid-19 vaccine status: Information provided on how to obtain vaccines.   Qualifies for Shingles Vaccine? Yes   Zostavax completed Yes   Shingrix Completed?: No.  Education has been provided regarding the importance of this vaccine. Patient has been advised to call insurance company to determine out of pocket expense if they have not yet received this vaccine. Advised may also receive vaccine at local pharmacy or Health Dept. Verbalized acceptance and understanding.  Screening Tests Health Maintenance  Topic Date Due   FOOT EXAM  Never done   OPHTHALMOLOGY EXAM  Never done   Zoster Vaccines- Shingrix (1 of 2) Never done   COVID-19 Vaccine (4 - Booster for Pfizer series) 05/11/2020    HEMOGLOBIN A1C  10/19/2021   TETANUS/TDAP  12/23/2030   Pneumonia Vaccine 81+ Years old  Completed   INFLUENZA VACCINE  Completed   HPV VACCINES  Aged Out    Health Maintenance  Health Maintenance Due  Topic Date Due   FOOT EXAM  Never done   OPHTHALMOLOGY EXAM  Never done   Zoster Vaccines- Shingrix (1 of 2) Never done   COVID-19 Vaccine (4 - Booster for Pfizer series) 05/11/2020    Colorectal cancer screening: No longer required.   Lung Cancer Screening: (Low Dose CT Chest recommended if Age 74-80 years, 30 pack-year currently smoking OR have quit w/in 15years.) does not qualify.     Additional Screening:  Hepatitis C Screening: does not qualify  Vision Screening: Recommended annual ophthalmology exams for early detection of glaucoma and other disorders of the eye. Is the patient up to date with their annual eye exam?  No  Who is the provider or what is the name of the office in which the patient attends annual eye exams? Dr. Mendel Ryder   Dental Screening: Recommended annual dental exams for proper oral hygiene  Community Resource Referral / Chronic Care Management: CRR required this visit?  No   CCM required this visit?  No      Plan:     I have personally reviewed and noted the following in the patients chart:   Medical and social history Use of alcohol, tobacco or illicit drugs  Current medications and supplements including opioid prescriptions. Patient is not currently taking opioid prescriptions. Functional ability and status Nutritional status Physical activity Advanced directives List of other physicians Hospitalizations, surgeries, and ER visits in previous 12 months Vitals Screenings to include cognitive, depression, and falls Referrals and appointments  In addition, I have reviewed and discussed with patient certain preventive protocols, quality metrics, and best practice recommendations. A written personalized care plan for preventive services as  well as general preventive health recommendations were provided to patient.   Due to this being a telephonic visit, the after visit summary with patients personalized plan was offered to patient via mail or my-chart. Patient declined at this time.   Marta Antu, LPN   11/23/3792  Nurse Health Advisor  Nurse Notes: None

## 2021-07-24 NOTE — Patient Instructions (Signed)
Hayden Garza , Thank you for taking time to complete your Medicare Wellness Visit. I appreciate your ongoing commitment to your health goals. Please review the following plan we discussed and let me know if I can assist you in the future.   Screening recommendations/referrals: Colonoscopy: No longer required Recommended yearly ophthalmology/optometry visit for glaucoma screening and checkup Recommended yearly dental visit for hygiene and checkup  Vaccinations: Influenza vaccine: Up to date Pneumococcal vaccine: Up to date Tdap vaccine: Up to date Shingles vaccine: May obtain vaccine at your local pharmacy. Covid-19: Booster available at the pharmacy  Advanced directives: Please bring a copy of Living Will and/or Healthcare Power of Attorney for your chart.   Conditions/risks identified: See problem list  Next appointment: Follow up in one year for your annual wellness visit.   Preventive Care 86 Years and Older, Male Preventive care refers to lifestyle choices and visits with your health care provider that can promote health and wellness. What does preventive care include? A yearly physical exam. This is also called an annual well check. Dental exams once or twice a year. Routine eye exams. Ask your health care provider how often you should have your eyes checked. Personal lifestyle choices, including: Daily care of your teeth and gums. Regular physical activity. Eating a healthy diet. Avoiding tobacco and drug use. Limiting alcohol use. Practicing safe sex. Taking low doses of aspirin every day. Taking vitamin and mineral supplements as recommended by your health care provider. What happens during an annual well check? The services and screenings done by your health care provider during your annual well check will depend on your age, overall health, lifestyle risk factors, and family history of disease. Counseling  Your health care provider may ask you questions about  your: Alcohol use. Tobacco use. Drug use. Emotional well-being. Home and relationship well-being. Sexual activity. Eating habits. History of falls. Memory and ability to understand (cognition). Work and work Statistician. Screening  You may have the following tests or measurements: Height, weight, and BMI. Blood pressure. Lipid and cholesterol levels. These may be checked every 5 years, or more frequently if you are over 86 years old. Skin check. Lung cancer screening. You may have this screening every year starting at age 86 if you have a 30-pack-year history of smoking and currently smoke or have quit within the past 15 years. Fecal occult blood test (FOBT) of the stool. You may have this test every year starting at age 86. Flexible sigmoidoscopy or colonoscopy. You may have a sigmoidoscopy every 5 years or a colonoscopy every 10 years starting at age 86. Prostate cancer screening. Recommendations will vary depending on your family history and other risks. Hepatitis C blood test. Hepatitis B blood test. Sexually transmitted disease (STD) testing. Diabetes screening. This is done by checking your blood sugar (glucose) after you have not eaten for a while (fasting). You may have this done every 1-3 years. Abdominal aortic aneurysm (AAA) screening. You may need this if you are a current or former smoker. Osteoporosis. You may be screened starting at age 86 if you are at high risk. Talk with your health care provider about your test results, treatment options, and if necessary, the need for more tests. Vaccines  Your health care provider may recommend certain vaccines, such as: Influenza vaccine. This is recommended every year. Tetanus, diphtheria, and acellular pertussis (Tdap, Td) vaccine. You may need a Td booster every 10 years. Zoster vaccine. You may need this after age 86. Pneumococcal 13-valent conjugate (  PCV13) vaccine. One dose is recommended after age 86. Pneumococcal  polysaccharide (PPSV23) vaccine. One dose is recommended after age 86. Talk to your health care provider about which screenings and vaccines you need and how often you need them. This information is not intended to replace advice given to you by your health care provider. Make sure you discuss any questions you have with your health care provider. Document Released: 06/24/2015 Document Revised: 02/15/2016 Document Reviewed: 03/29/2015 Elsevier Interactive Patient Education  2017 [place] Prevention in the Home Falls can cause injuries. They can happen to people of all ages. There are many things you can do to make your home safe and to help prevent falls. What can I do on the outside of my home? Regularly fix the edges of walkways and driveways and fix any cracks. Remove anything that might make you trip as you walk through a door, such as a raised step or threshold. Trim any bushes or trees on the path to your home. Use bright outdoor lighting. Clear any walking paths of anything that might make someone trip, such as rocks or tools. Regularly check to see if handrails are loose or broken. Make sure that both sides of any steps have handrails. Any raised decks and porches should have guardrails on the edges. Have any leaves, snow, or ice cleared regularly. Use sand or salt on walking paths during winter. Clean up any spills in your garage right away. This includes oil or grease spills. What can I do in the bathroom? Use night lights. Install grab bars by the toilet and in the tub and shower. Do not use towel bars as grab bars. Use non-skid mats or decals in the tub or shower. If you need to sit down in the shower, use a plastic, non-slip stool. Keep the floor dry. Clean up any water that spills on the floor as soon as it happens. Remove soap buildup in the tub or shower regularly. Attach bath mats securely with double-sided non-slip rug tape. Do not have throw rugs and other  things on the floor that can make you trip. What can I do in the bedroom? Use night lights. Make sure that you have a light by your bed that is easy to reach. Do not use any sheets or blankets that are too big for your bed. They should not hang down onto the floor. Have a firm chair that has side arms. You can use this for support while you get dressed. Do not have throw rugs and other things on the floor that can make you trip. What can I do in the kitchen? Clean up any spills right away. Avoid walking on wet floors. Keep items that you use a lot in easy-to-reach places. If you need to reach something above you, use a strong step stool that has a grab bar. Keep electrical cords out of the way. Do not use floor polish or wax that makes floors slippery. If you must use wax, use non-skid floor wax. Do not have throw rugs and other things on the floor that can make you trip. What can I do with my stairs? Do not leave any items on the stairs. Make sure that there are handrails on both sides of the stairs and use them. Fix handrails that are broken or loose. Make sure that handrails are as long as the stairways. Check any carpeting to make sure that it is firmly attached to the stairs. Fix any carpet that is  loose or worn. Avoid having throw rugs at the top or bottom of the stairs. If you do have throw rugs, attach them to the floor with carpet tape. Make sure that you have a light switch at the top of the stairs and the bottom of the stairs. If you do not have them, ask someone to add them for you. What else can I do to help prevent falls? Wear shoes that: Do not have high heels. Have rubber bottoms. Are comfortable and fit you well. Are closed at the toe. Do not wear sandals. If you use a stepladder: Make sure that it is fully opened. Do not climb a closed stepladder. Make sure that both sides of the stepladder are locked into place. Ask someone to hold it for you, if possible. Clearly  mark and make sure that you can see: Any grab bars or handrails. First and last steps. Where the edge of each step is. Use tools that help you move around (mobility aids) if they are needed. These include: Canes. Walkers. Scooters. Crutches. Turn on the lights when you go into a dark area. Replace any light bulbs as soon as they burn out. Set up your furniture so you have a clear path. Avoid moving your furniture around. If any of your floors are uneven, fix them. If there are any pets around you, be aware of where they are. Review your medicines with your doctor. Some medicines can make you feel dizzy. This can increase your chance of falling. Ask your doctor what other things that you can do to help prevent falls. This information is not intended to replace advice given to you by your health care provider. Make sure you discuss any questions you have with your health care provider. Document Released: 03/24/2009 Document Revised: 11/03/2015 Document Reviewed: 07/02/2014 Elsevier Interactive Patient Education  2017 [employer].

## 2021-07-25 LAB — COMPREHENSIVE METABOLIC PANEL
ALT: 9 U/L (ref 0–53)
AST: 15 U/L (ref 0–37)
Albumin: 3.8 g/dL (ref 3.5–5.2)
Alkaline Phosphatase: 51 U/L (ref 39–117)
BUN: 23 mg/dL (ref 6–23)
CO2: 27 mEq/L (ref 19–32)
Calcium: 9.1 mg/dL (ref 8.4–10.5)
Chloride: 104 mEq/L (ref 96–112)
Creatinine, Ser: 1.59 mg/dL — ABNORMAL HIGH (ref 0.40–1.50)
GFR: 39.05 mL/min — ABNORMAL LOW (ref >60.00)
Glucose, Bld: 246 mg/dL — ABNORMAL HIGH (ref 70–99)
Potassium: 4.7 mEq/L (ref 3.5–5.1)
Sodium: 140 mEq/L (ref 135–145)
Total Bilirubin: 0.4 mg/dL (ref 0.2–1.2)
Total Protein: 6.1 g/dL (ref 6.0–8.3)

## 2021-07-25 LAB — LIPID PANEL
Cholesterol: 104 mg/dL (ref 0–200)
HDL: 43 mg/dL (ref >39.00)
LDL Cholesterol: 31 mg/dL (ref 0–99)
NonHDL: 60.87
Total CHOL/HDL Ratio: 2
Triglycerides: 151 mg/dL — ABNORMAL HIGH (ref 0.0–149.0)
VLDL: 30.2 mg/dL (ref 0.0–40.0)

## 2021-07-25 LAB — IRON,TIBC AND FERRITIN PANEL
%SAT: 17 % (calc) — ABNORMAL LOW (ref 20–48)
Ferritin: 29 ng/mL (ref 24–380)
Iron: 37 ug/dL — ABNORMAL LOW (ref 50–180)
TIBC: 222 mcg/dL (calc) — ABNORMAL LOW (ref 250–425)

## 2021-07-26 ENCOUNTER — Other Ambulatory Visit: Payer: Self-pay

## 2021-07-26 DIAGNOSIS — N289 Disorder of kidney and ureter, unspecified: Secondary | ICD-10-CM

## 2021-08-02 ENCOUNTER — Other Ambulatory Visit (HOSPITAL_BASED_OUTPATIENT_CLINIC_OR_DEPARTMENT_OTHER): Payer: Self-pay

## 2021-08-02 DIAGNOSIS — Z4789 Encounter for other orthopedic aftercare: Secondary | ICD-10-CM | POA: Diagnosis not present

## 2021-08-02 MED ORDER — TRAMADOL HCL 50 MG PO TABS
ORAL_TABLET | ORAL | 0 refills | Status: DC
  Filled 2021-08-02: qty 50, 13d supply, fill #0

## 2021-08-03 ENCOUNTER — Other Ambulatory Visit: Payer: Self-pay | Admitting: Internal Medicine

## 2021-08-07 ENCOUNTER — Encounter: Payer: Medicare HMO | Admitting: Family Medicine

## 2021-08-08 ENCOUNTER — Other Ambulatory Visit: Payer: Self-pay

## 2021-08-08 ENCOUNTER — Ambulatory Visit: Payer: Medicare HMO | Admitting: Internal Medicine

## 2021-08-08 ENCOUNTER — Encounter: Payer: Self-pay | Admitting: Internal Medicine

## 2021-08-08 VITALS — BP 157/71 | HR 71 | Wt 146.0 lb

## 2021-08-08 DIAGNOSIS — T8450XD Infection and inflammatory reaction due to unspecified internal joint prosthesis, subsequent encounter: Secondary | ICD-10-CM | POA: Diagnosis not present

## 2021-08-08 NOTE — Patient Instructions (Signed)
Continue doxy. Takes zofran as needed for nausea and miralax as needed for constipation  Anticipate 12 months of antibiotics. Then to discuss with your surgeon about going off   Follow up with me in 6-8 weeks

## 2021-08-08 NOTE — Progress Notes (Signed)
[facility]  Patient Active Problem List   Diagnosis Date Noted   Septic joint of right shoulder region [facility]) 04/20/2021   Insomnia 12/24/2020   Renal insufficiency 06/26/2020   BCC (basal cell carcinoma of skin) 06/26/2020   Right shoulder pain 06/26/2020   CAD (coronary artery disease) 06/15/2015   Essential hypertension 06/15/2015   Anemia 06/15/2015   NSTEMI (non-ST elevated myocardial infarction) ([place]) 04/30/2015   Dyspnea 05/22/2013   Hyperlipidemia 05/22/2013   Diabetes mellitus type 2, noninsulin dependent ([place]) 10/17/2009   ALLERGIC RHINITIS 10/17/2009   PULMONARY NODULE, RIGHT LOWER LOBE 10/17/2009      Subjective:    Patient ID: Hayden Garza, male    DOB: [birth year], 88 y.o.   MRN: [id number]  Chief Complaint  Patient presents with   Follow-up    HPI:  Hayden Garza is a 88 y.o. 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 The tissue culture "normal flora" has p acnes growing with mixed flora (no strep group a/staph). The other 2 samples only grew p-acnes and was collected 1700. The one with mixed flora was collected 1730   Patient reported that his surgeon has no plan at this time to replace a new joint as his bone stock is minimal  No diarrhea, n/v He is actually constipated  He does report a little irritation at the end of his urination; no urgency. Has chronic frequency every few hours. No blood  12/06 id clinic f/u I had patient continuing on doxycycline only and stopped cefadroxil for gram negative coverage. The aspirate of his joint only grew p-acnes while the operative cx grew both p-acnes and some  other gram positive flora (only 1 sample) which I suspect is either lab contaminant No f/c Pain improving  06/21/21 id clinic f/u Moderate pain stable Nausea but able to eat Yellowish/red discoloration anterior of the right shoulder No f/c No diarrhea No other rash Last good bowel movement a week ago. Just a small one the morning of this visit No abdominal pain   08/08/21 id f/u Patient was given zofran to take as needed for nausea due to doxy. He takes zofran once day Had severe allergy to penicillin in his 23's and haven't taken any pcn analogue since then. Took cefadroxil in 04/2021 though and tolerated He is ok with keeping on doxy/zofran for now Shoulder pain right side stable 5-6/10 since after surgery No fever, chill No diarrhea, but constipation    Allergies  Allergen Reactions   Lipitor [Atorvastatin]     Joint and muscle pain   Penicillins     swelling in joints      Outpatient Medications Prior to Visit  Medication Sig Dispense Refill   aspirin EC 81 MG tablet Take 4 tablets (325 mg total) by mouth daily. (Patient taking differently: Take 81 mg by mouth daily.)     chlorhexidine (PERIDEX) 0.12 % solution After brushing, rinse with one capful for one minute twice a day then spit out 473 mL 0   Cholecalciferol (VITAMIN D) 50 MCG (2000 UT) tablet Take 2,000 Units by mouth daily.  Coenzyme Q10 (COQ10) 100 MG CAPS Take 100 mg by mouth daily.     doxazosin (CARDURA) 8 MG tablet TAKE 1 TABLET AT BEDTIME 90 tablet 1   doxycycline (VIBRA-TABS) 100 MG tablet Take 1 tablet (100 mg total) by mouth 2 (two) times daily. 60 tablet 5   ferrous gluconate (FERGON) 240 (27 FE) MG tablet Take 240 mg by mouth daily.     finasteride (PROSCAR) 5 MG tablet TAKE 1 TABLET EVERY DAY 90 tablet 1   glimepiride (AMARYL) 4 MG tablet TAKE 1 TABLET EVERY DAY BEFORE BREAKFAST 90 tablet 1   lisinopril (ZESTRIL) 2.5 MG tablet Take 1 tablet (2.5 mg total) by mouth daily. 90 tablet 2   metFORMIN  (GLUCOPHAGE) 500 MG tablet TAKE 2 TABLETS TWICE DAILY WITH MEALS (Patient taking differently: Take 500 mg by mouth daily.) 360 tablet 0   metoprolol tartrate (LOPRESSOR) 25 MG tablet Take 1/2 tablets (12.5 mg total) by mouth 2 (two) times daily. (Patient taking differently: Take 25 mg by mouth daily.) 180 tablet 3   Multiple Vitamin (MULTIVITAMIN WITH MINERALS) TABS tablet Take 1 tablet by mouth daily.     omeprazole (PRILOSEC) 40 MG capsule TAKE 1 CAPSULE EVERY DAY 90 capsule 1   ondansetron (ZOFRAN) 4 MG tablet TAKE 1 TABLET BY MOUTH EVERY 8 HOURS AS NEEDED FOR NAUSEA OR VOMITING 20 tablet 0   oxyCODONE-acetaminophen (PERCOCET) 5-325 MG tablet Take 1 tablet by mouth every 4 (four) hours as needed for severe pain (max 6 q). 12 tablet 0   pioglitazone (ACTOS) 15 MG tablet TAKE 1 TABLET EVERY DAY 90 tablet 1   rosuvastatin (CRESTOR) 5 MG tablet TAKE 1 TABLET EVERY DAY 90 tablet 1   sodium polystyrene (KAYEXALATE) 15 GM/60ML suspension Take 60 milliliters (15 grams total) by mouth daily for 4 days. 240 mL 0   traMADol (ULTRAM) 50 MG tablet Take 1 tablet (50 mg total) by mouth every 12 (twelve) hours as needed (pain). 30 tablet 0   traMADol (ULTRAM) 50 MG tablet Take 1 tablet by mouth at bedtime as needed for pain 30 tablet 0   traMADol (ULTRAM) 50 MG tablet Take 1 tablet by mouth every 6 hours as needed for pain 50 tablet 0   nitroGLYCERIN (NITROSTAT) 0.4 MG SL tablet PLACE 1 TABLET UNDER THE TONGUE EVERY 5 MINUTES AS NEEDED FOR CHESTPAIN 25 tablet 3   zolpidem (AMBIEN) 10 MG tablet Take 1 tablet (10 mg total) by mouth at bedtime as needed for sleep. (Patient taking differently: Take 5-10 mg by mouth at bedtime.) 90 tablet 1   No facility-administered medications prior to visit.     Social History   Socioeconomic History   Marital status: Married    Spouse name: Not on file   Number of children: 1   Years of education: Not on file   Highest education level: Not on file  Occupational History    Occupation: Retired  Tobacco Use   Smoking status: Never   Smokeless tobacco: Never  Substance and Sexual Activity   Alcohol use: No   Drug use: No   Sexual activity: Not on file  Other Topics Concern   Not on file  Social History Narrative   Lives in [place], [place] with wife. No pets. No dietary restrictions      Retired from truck driving, long haul   No cigarettes, alcohol to excess   Seat belts every time.    Social Determinants of Health   Financial Resource Strain:  Low Risk    Difficulty of Paying Living Expenses: Not hard at all  Food Insecurity: No Food Insecurity   Worried About Running Out of Food in the Last Year: Never true   Ran Out of Food in the Last Year: Never true  Transportation Needs: No Transportation Needs   Lack of Transportation (Medical): No   Lack of Transportation (Non-Medical): No  Physical Activity: Inactive   Days of Exercise per Week: 0 days   Minutes of Exercise per Session: 0 min  Stress: No Stress Concern Present   Feeling of Stress : Not at all  Social Connections: Moderately Integrated   Frequency of Communication with Friends and Family: More than three times a week   Frequency of Social Gatherings with Friends and Family: More than three times a week   Attends Religious Services: More than 4 times per year   Active Member of [employer] or Organizations: No   Attends Archivist Meetings: Never   Marital Status: Married  Human resources officer Violence: Not At Risk   Fear of Current or Ex-Partner: No   Emotionally Abused: No   Physically Abused: No   Sexually Abused: No      Review of Systems All other ros negative  Objective:    BP (!) 157/71    Pulse 71    Wt 146 lb (66.2 kg)    BMI 24.30 kg/m  Nursing note and vital signs reviewed.  Physical Exam General/constitutional: no distress, pleasant HEENT: Normocephalic, PER, Conj Clear, EOMI, Oropharynx clear Neck supple CV: rrr no mrg Lungs: clear to auscultation, normal  respiratory effort Abd: Soft, Nontender Ext: no edema Skin: No Rash Neuro: nonfocal MSK: no peripheral joint swelling/tenderness/warmth; back spines nontender   Labs: Lab Results  Component Value Date   WBC 6.9 07/24/2021   HGB 10.7 (L) 07/24/2021   HCT 32.3 (L) 07/24/2021   MCV 94.9 07/24/2021   PLT 164.0 57/32/2025   Last metabolic panel Lab Results  Component Value Date   GLUCOSE 246 (H) 07/24/2021   NA 140 07/24/2021   K 4.7 07/24/2021   CL 104 07/24/2021   CO2 27 07/24/2021   BUN 23 07/24/2021   CREATININE 1.59 (H) 07/24/2021   GFRNONAA 46 (L) 04/24/2021   CALCIUM 9.1 07/24/2021   PROT 6.1 07/24/2021   ALBUMIN 3.8 07/24/2021   BILITOT 0.4 07/24/2021   ALKPHOS 51 07/24/2021   AST 15 07/24/2021   ALT 9 07/24/2021   ANIONGAP 10 04/20/2021    Micro:  Serology:  Imaging:  Assessment & Plan:   Problem List Items Addressed This Visit   None   No orders of the defined types were placed in this encounter.    88 year old male with complicated history of right shoulder interventions started in the 1990s,  underwent right shoulder reverse arthroplasty 4270 complicated by right should pain which was worked up in 2015 admitted for right shoulder PJI.  He had presented to orthopedic clinic for a 3 cm mass over the right shoulder enlarging over the past couple of days.  s/p right shoulder PJI SP hardware removal and cement placement on 04/20/21     11/09 cx p-acnes but one plate with gpc/gpr. Suspect contaminant gpc/gpr (not worked up but no staph/strep). Aspirate cx only p-acnes  12/06 assessment Patient had continued only doxycycline since 12/01 He is doing well  His surgeon did mention there was some retained cement that couldn't be removed and we had agreed on continue indefinite antibiotics or at  least 6-12 months. At this time there is no plan for a new shoulder surgery as there is no bone stock for that   06/21/21 assessment Unclear what the discoloration  is, almost like a bruise. Not cellulitic Clinically stable on doxycycline suppression He is to see his orthopedic surgeon today Constipated making nausea/abx side effect worse?  08/08/21 assessment Constipation - miralax helping. Discuss prune juice/prune-cereal Nausea - controlled with zofran. Not wanting to switch to amoxicillin due to hx severe pcn alergy. Tolerated cefadroxil which potentially could be another abx for p-acnes   -continue doxy -- plan around 12 months since last surgery. At that time will discuss with his orthopedic surgeon regarding trial off of abx -follow up with me in 6-8 weeks  Follow-up: Return in about 6 weeks (around 09/19/2021).   I have spent a total of 20 minutes of face-to-face and non-face-to-face time, excluding clinical staff time, preparing to see patient, ordering tests and/or medications, and provide counseling the patient   Jabier Mutton, [facility] [phone]  pager   [phone] cell 08/08/2021, 8:48 AM

## 2021-08-11 ENCOUNTER — Other Ambulatory Visit (HOSPITAL_BASED_OUTPATIENT_CLINIC_OR_DEPARTMENT_OTHER): Payer: Self-pay

## 2021-08-16 ENCOUNTER — Other Ambulatory Visit: Payer: Self-pay | Admitting: Family Medicine

## 2021-08-16 DIAGNOSIS — E119 Type 2 diabetes mellitus without complications: Secondary | ICD-10-CM

## 2021-08-16 DIAGNOSIS — I214 Non-ST elevation (NSTEMI) myocardial infarction: Secondary | ICD-10-CM

## 2021-08-16 DIAGNOSIS — R0600 Dyspnea, unspecified: Secondary | ICD-10-CM

## 2021-08-16 DIAGNOSIS — D649 Anemia, unspecified: Secondary | ICD-10-CM

## 2021-08-16 DIAGNOSIS — E785 Hyperlipidemia, unspecified: Secondary | ICD-10-CM

## 2021-08-19 ENCOUNTER — Other Ambulatory Visit: Payer: Self-pay | Admitting: Family Medicine

## 2021-08-21 NOTE — Telephone Encounter (Signed)
Requesting: Ambien '10mg'$   ?Contract: None ?UDS: None ?Last Visit: 06/16/2021 ?Next Visit: 09/28/2021 ?Last Refill: 01/23/2021 #90 and 1RF ?Pt sig: 1 tab qhs prn ? ?Please Advise ? ?

## 2021-08-25 ENCOUNTER — Other Ambulatory Visit: Payer: Self-pay

## 2021-08-25 ENCOUNTER — Other Ambulatory Visit (HOSPITAL_BASED_OUTPATIENT_CLINIC_OR_DEPARTMENT_OTHER): Payer: Self-pay

## 2021-08-25 DIAGNOSIS — G8929 Other chronic pain: Secondary | ICD-10-CM

## 2021-08-25 MED ORDER — TRAMADOL HCL 50 MG PO TABS
ORAL_TABLET | ORAL | 0 refills | Status: DC
  Filled 2021-08-25: qty 50, 13d supply, fill #0

## 2021-08-25 NOTE — Progress Notes (Signed)
Requesting: Tramadol '50MG'$  ?Contract: none  ?UDS: none ?Last Visit: 06/16/21 ?Next Visit: 09/28/21 ?Last Refill: 08/25/21 ? ?Please Advise  ?

## 2021-08-31 ENCOUNTER — Telehealth: Payer: Self-pay | Admitting: Family Medicine

## 2021-08-31 NOTE — Telephone Encounter (Signed)
Pt states he does not understand why he went down to taking half a pill. He stated they are not working. He would like a call to explain, please advise.  ? ?[address]  ?[place], [place] [address]  ?Phone:  [phone]  Fax:  [phone]  ? ? ?

## 2021-09-01 ENCOUNTER — Other Ambulatory Visit (HOSPITAL_BASED_OUTPATIENT_CLINIC_OR_DEPARTMENT_OTHER): Payer: Self-pay

## 2021-09-01 NOTE — Telephone Encounter (Signed)
He's referring to his Ambien. He was taking 1 tablet nighty, latest refill the signature tells him to take 1/2 tab nightly. Please advise thanks. ?

## 2021-09-02 ENCOUNTER — Other Ambulatory Visit: Payer: Self-pay | Admitting: Family Medicine

## 2021-09-02 MED ORDER — ZOLPIDEM TARTRATE 10 MG PO TABS: 10.0000 mg | ORAL_TABLET | Freq: Every evening | ORAL | 1 refills | Status: DC | PRN

## 2021-09-05 NOTE — Telephone Encounter (Signed)
Pt has been notified, expressed understanding. ?

## 2021-09-05 NOTE — Progress Notes (Signed)
? ? ? ? ?HPI: FU CAD. Admitted 11/16 with NSTEMI; cath revealed 40 RCA, 90 D1, 90 mild LAD, 95 distal LAD, 90 OM 2; had PCI of mid LAD with DES. Echocardiogram January 2022 showed normal LV function, mild left ventricular hypertrophy, grade 1 diastolic dysfunction, mild mitral vegetation.  Since last seen patient denies dyspnea, chest pain, palpitations or syncope. ? ?Current Outpatient Medications  ?Medication Sig Dispense Refill  ? aspirin EC 81 MG tablet Take 4 tablets (325 mg total) by mouth daily. (Patient taking differently: Take 81 mg by mouth daily.)    ? chlorhexidine (PERIDEX) 0.12 % solution After brushing, rinse with one capful for one minute twice a day then spit out 473 mL 0  ? Cholecalciferol (VITAMIN D) 50 MCG (2000 UT) tablet Take 2,000 Units by mouth daily.    ? Coenzyme Q10 (COQ10) 100 MG CAPS Take 100 mg by mouth daily.    ? doxazosin (CARDURA) 8 MG tablet TAKE 1 TABLET AT BEDTIME 90 tablet 1  ? ferrous gluconate (FERGON) 240 (27 FE) MG tablet Take 240 mg by mouth daily.    ? finasteride (PROSCAR) 5 MG tablet TAKE 1 TABLET EVERY DAY 90 tablet 1  ? glimepiride (AMARYL) 4 MG tablet TAKE 1 TABLET EVERY DAY BEFORE BREAKFAST 90 tablet 1  ? metFORMIN (GLUCOPHAGE) 500 MG tablet TAKE 2 TABLETS TWICE A DAY WITH MEALS 360 tablet 0  ? metoprolol tartrate (LOPRESSOR) 25 MG tablet Take 1/2 tablets (12.5 mg total) by mouth 2 (two) times daily. (Patient taking differently: Take 25 mg by mouth daily.) 180 tablet 3  ? Multiple Vitamin (MULTIVITAMIN WITH MINERALS) TABS tablet Take 1 tablet by mouth daily.    ? nitroGLYCERIN (NITROSTAT) 0.4 MG SL tablet PLACE 1 TABLET UNDER THE TONGUE EVERY 5 MINUTES AS NEEDED FOR CHESTPAIN 25 tablet 3  ? omeprazole (PRILOSEC) 40 MG capsule TAKE 1 CAPSULE EVERY DAY 90 capsule 1  ? pioglitazone (ACTOS) 15 MG tablet TAKE 1 TABLET EVERY DAY 90 tablet 1  ? rosuvastatin (CRESTOR) 5 MG tablet TAKE 1 TABLET EVERY DAY 90 tablet 1  ? traMADol (ULTRAM) 50 MG tablet Take 1 tablet by mouth  every 6 hours as needed for pain 50 tablet 0  ? zolpidem (AMBIEN) 10 MG tablet Take 1 tablet (10 mg total) by mouth at bedtime as needed for sleep. 90 tablet 1  ? sodium polystyrene (KAYEXALATE) 15 GM/60ML suspension Take 60 milliliters (15 grams total) by mouth daily for 4 days. 240 mL 0  ? traMADol (ULTRAM) 50 MG tablet Take 1 tablet(s) by mouth daily as needed for pain. 90 tablet 0  ? ?No current facility-administered medications for this visit.  ? ? ? ?Past Medical History:  ?Diagnosis Date  ? Allergic rhinitis   ? Anemia   ? BPH (benign prostatic hyperplasia)   ? With obstruction/lower urinary tract symptoms. Takes cardura for prostate.   ? CAD (coronary artery disease)   ? a. 05/02/2015: DES to mid LAD  ? Diabetes mellitus   ? type 2  ? Essential hypertension 06/15/2015  ? GERD (gastroesophageal reflux disease)   ? controled on Prilosec  ? History of urinary calculi   ? Incidental pulmonary nodule   ? Kidney stone   ? Mixed hyperlipidemia   ? Skin cancer   ? 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 ? ?Past Surgical History:  ?Procedure Laterality Date  ? APPENDECTOMY    ? BACK SURGERY    ? vertebral collapse in lower back  had insturment, reports no metal  ? CARDIAC CATHETERIZATION N/A 05/02/2015  ? Procedure: Left Heart Cath and Coronary Angiography;  Surgeon: Lorretta Harp, MD;  Location: [facility];  Service: Cardiovascular;  Laterality: N/A;  ? CARDIAC CATHETERIZATION N/A 05/02/2015  ? Procedure: Coronary Stent Intervention;  Surgeon: Lorretta Harp, MD;  Location: [facility];  Service: Cardiovascular;  Laterality: N/A;  ? CORONARY ANGIOPLASTY WITH STENT PLACEMENT    ? JOINT REPLACEMENT Right   ? 6 surgeries including total shoulder replacement roughly in 2000  ? MOHS SURGERY    ? TOTAL SHOULDER REPLACEMENT    ? TOTAL SHOULDER REVISION Right 04/20/2021  ? Procedure: Removal of infected right shoulder arthroplasty, implantation of cement spacer;  Surgeon: Justice Britain, MD;  Location: [facility];   Service: Orthopedics;  Laterality: Right;  ? ? ?Social History  ? ?Socioeconomic History  ? Marital status: Married  ?  Spouse name: Not on file  ? Number of children: 1  ? Years of education: Not on file  ? Highest education level: Not on file  ?Occupational History  ? Occupation: Retired  ?Tobacco Use  ? Smoking status: Never  ? Smokeless tobacco: Never  ?Substance and Sexual Activity  ? Alcohol use: No  ? Drug use: No  ? Sexual activity: Not on file  ?Other Topics Concern  ? Not on file  ?Social History Narrative  ? Lives in [place], [place] with wife. No pets. No dietary restrictions  ?   ? Retired from truck driving, [employer]  ? No cigarettes, alcohol to excess  ? Seat belts every time.   ? ?Social Determinants of Health  ? ?Financial Resource Strain: Low Risk   ? Difficulty of Paying Living Expenses: Not hard at all  ?Food Insecurity: No Food Insecurity  ? Worried About Charity fundraiser in the Last Year: Never true  ? Ran Out of Food in the Last Year: Never true  ?Transportation Needs: No Transportation Needs  ? Lack of Transportation (Medical): No  ? Lack of Transportation (Non-Medical): No  ?Physical Activity: Inactive  ? Days of Exercise per Week: 0 days  ? Minutes of Exercise per Session: 0 min  ?Stress: No Stress Concern Present  ? Feeling of Stress : Not at all  ?Social Connections: Moderately Integrated  ? Frequency of Communication with Friends and Family: More than three times a week  ? Frequency of Social Gatherings with Friends and Family: More than three times a week  ? Attends Religious Services: More than 4 times per year  ? Active Member of Clubs or Organizations: No  ? Attends Archivist Meetings: Never  ? Marital Status: Married  ?Intimate Partner Violence: Not At Risk  ? Fear of Current or Ex-Partner: No  ? Emotionally Abused: No  ? Physically Abused: No  ? Sexually Abused: No  ? ? ?Family History  ?Problem Relation Age of Onset  ? Heart disease Mother   ?     chf  ? Heart attack  Father   ? CAD Father   ? Colitis Sister   ? Other Brother   ?     head trauma  ? Heart disease Brother   ? Hyperlipidemia Brother   ? Hypertension Brother   ? Diabetes Brother   ? Kidney failure Brother   ? Diabetes Son   ? ? ?ROS: Right shoulder pain but no fevers or chills, productive cough, hemoptysis, dysphasia, odynophagia, melena, hematochezia, dysuria,  hematuria, rash, seizure activity, orthopnea, PND, pedal edema, claudication. Remaining systems are negative. ? ?Physical Exam: ?Well-developed well-nourished in no acute distress.  ?Skin is warm and dry.  ?HEENT is normal.  ?Neck is supple.  ?Chest is clear to auscultation with normal expansion.  ?Cardiovascular exam is regular rate and rhythm.  ?Abdominal exam nontender or distended. No masses palpated. ?Extremities show no edema. ?neuro grossly intact ? ?ECG-normal sinus rhythm at a rate of 65, right bundle branch block.  Personally reviewed ? ?A/P ? ?1 coronary artery disease-patient doing well with no chest pain.  Continue medical therapy with aspirin and statin. ? ?2 hyperlipidemia-continue present dose of Crestor.  He did not tolerate higher doses in the past. ? ?3 hypertension-patient's blood pressure is controlled.  Continue present medications and follow. ? ?Kirk Ruths, MD ? ? ? ?

## 2021-09-11 ENCOUNTER — Other Ambulatory Visit (HOSPITAL_BASED_OUTPATIENT_CLINIC_OR_DEPARTMENT_OTHER): Payer: Self-pay

## 2021-09-11 DIAGNOSIS — Z96611 Presence of right artificial shoulder joint: Secondary | ICD-10-CM | POA: Diagnosis not present

## 2021-09-11 MED ORDER — TRAMADOL HCL 50 MG PO TABS
ORAL_TABLET | ORAL | 0 refills | Status: DC
Start: 2021-09-11 — End: 2022-01-04
  Filled 2021-09-11: qty 30, 30d supply, fill #0

## 2021-09-13 ENCOUNTER — Encounter: Payer: Self-pay | Admitting: Cardiology

## 2021-09-13 ENCOUNTER — Ambulatory Visit: Payer: Medicare HMO | Admitting: Cardiology

## 2021-09-13 VITALS — BP 103/61 | HR 65 | Ht 65.0 in | Wt 152.0 lb

## 2021-09-13 DIAGNOSIS — I10 Essential (primary) hypertension: Secondary | ICD-10-CM | POA: Diagnosis not present

## 2021-09-13 DIAGNOSIS — E7800 Pure hypercholesterolemia, unspecified: Secondary | ICD-10-CM

## 2021-09-13 DIAGNOSIS — I2510 Atherosclerotic heart disease of native coronary artery without angina pectoris: Secondary | ICD-10-CM | POA: Diagnosis not present

## 2021-09-13 NOTE — Patient Instructions (Signed)

## 2021-09-15 ENCOUNTER — Other Ambulatory Visit (HOSPITAL_BASED_OUTPATIENT_CLINIC_OR_DEPARTMENT_OTHER): Payer: Self-pay

## 2021-09-18 ENCOUNTER — Other Ambulatory Visit (HOSPITAL_BASED_OUTPATIENT_CLINIC_OR_DEPARTMENT_OTHER): Payer: Self-pay

## 2021-09-26 ENCOUNTER — Ambulatory Visit: Payer: Medicare HMO | Admitting: Internal Medicine

## 2021-09-26 ENCOUNTER — Other Ambulatory Visit: Payer: Self-pay

## 2021-09-26 VITALS — BP 138/72 | HR 63 | Resp 16 | Ht 65.0 in | Wt 153.0 lb

## 2021-09-26 DIAGNOSIS — Z96611 Presence of right artificial shoulder joint: Secondary | ICD-10-CM

## 2021-09-26 DIAGNOSIS — T8450XD Infection and inflammatory reaction due to unspecified internal joint prosthesis, subsequent encounter: Secondary | ICD-10-CM | POA: Diagnosis not present

## 2021-09-26 NOTE — Patient Instructions (Signed)
You are doing very well ? ? ?Replacement joint if you decide, let me know so we can get you off antibiotics and sample your joint before placing new hardware ? ? ?Otherwise see me in around 3 months ?

## 2021-09-26 NOTE — Progress Notes (Signed)
?  ? ? ? ? ?[facility] ? ?Patient Active Problem List  ? Diagnosis Date Noted  ? Septic joint of right shoulder region [facility]) 04/20/2021  ? Insomnia 12/24/2020  ? Renal insufficiency 06/26/2020  ? BCC (basal cell carcinoma of skin) 06/26/2020  ? Right shoulder pain 06/26/2020  ? CAD (coronary artery disease) 06/15/2015  ? Essential hypertension 06/15/2015  ? Anemia 06/15/2015  ? NSTEMI (non-ST elevated myocardial infarction) ([place]) 04/30/2015  ? Dyspnea 05/22/2013  ? Hyperlipidemia 05/22/2013  ? Diabetes mellitus type 2, noninsulin dependent ([place]) 10/17/2009  ? ALLERGIC RHINITIS 10/17/2009  ? PULMONARY NODULE, RIGHT LOWER LOBE 10/17/2009  ? ? ? ? ?Subjective:  ? ? Patient ID: Hayden Garza, male    DOB: February 13, 1933, 88 y.o.   MRN: [id number] ? ?Cc: f/u prosthetic shoulder infection ? ?HPI: ? ?Hayden Garza is a 88 y.o. 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 ?I saw him last on 11/14 in the hospital ?At that time cx not growing yet so d/c'ed on empiric doxy/ceftriaxone ?He underwent hardware removal with placement abx spacer on 11/10 that ultimately grew p-acnes (2 out of 3 samples; 1 of 3 samles stain gpc/gpr but no growth) ? ?05/02/21 I called lab. The tissue culture "normal flora" has p acnes growing with mixed flora (no strep group a/staph). The other 2 samples only grew p-acnes and was collected 1700. The one with mixed flora was collected 1730 ? ? ?Patient reported that his surgeon has no plan at this time to replace a new joint as his bone stock is minimal ? ?No diarrhea, n/v ?He is actually constipated ? ?He does report a little irritation at the end of his urination; no urgency. Has chronic frequency every few hours. No blood ? ?12/06 id clinic f/u ?I had patient continuing on doxycycline only and stopped cefadroxil for gram negative coverage. The aspirate of his joint only grew p-acnes while the operative cx grew both p-acnes and some other gram positive  flora (only 1 sample) which I suspect is either lab contaminant ?No f/c ?Pain improving ? ?06/21/21 id clinic f/u ?Moderate pain stable ?Nausea but able to eat ?Yellowish/red discoloration anterior of the right shoulder ?No f/c ?No diarrhea ?No other rash ?Last good bowel movement a week ago. Just a small one the morning of this visit ?No abdominal pain ? ? ?08/08/21 id f/u ?Patient was given zofran to take as needed for nausea due to doxy. He takes zofran once day ?Had severe allergy to penicillin in his 30's and haven't taken any pcn analogue since then. Took cefadroxil in 04/2021 though and tolerated ?He is ok with keeping on doxy/zofran for now ?Shoulder pain right side stable 5-6/10 since after surgery ?No fever, chill ?No diarrhea, but constipation ? ? ?09/26/21 id fu ?Patient very happy today. Despite having only abx spacer in his right shoulder, he has minimal pain (compared to what he had the past several years even with the replacement joint). No f/c ?No further nausea. Not taking zofran ?His surgeon is offering him to proceed with the second stage replacement joint, but patient is at this time not thinking to get one ? ? ?Allergies  ?Allergen Reactions  ? Lipitor [Atorvastatin]   ?  Joint and muscle pain  ? Penicillins   ?  swelling in joints  ? ? ? ? ?Outpatient Medications Prior to Visit  ?Medication Sig Dispense Refill  ? aspirin EC 81 MG tablet Take 4  tablets (325 mg total) by mouth daily. (Patient taking differently: Take 81 mg by mouth daily.)    ? chlorhexidine (PERIDEX) 0.12 % solution After brushing, rinse with one capful for one minute twice a day then spit out 473 mL 0  ? Cholecalciferol (VITAMIN D) 50 MCG (2000 UT) tablet Take 2,000 Units by mouth daily.    ? Coenzyme Q10 (COQ10) 100 MG CAPS Take 100 mg by mouth daily.    ? doxazosin (CARDURA) 8 MG tablet TAKE 1 TABLET AT BEDTIME 90 tablet 1  ? doxycycline (VIBRA-TABS) 100 MG tablet Take 1 tablet (100 mg total) by mouth 2 (two) times daily. 60  tablet 5  ? ferrous gluconate (FERGON) 240 (27 FE) MG tablet Take 240 mg by mouth daily.    ? finasteride (PROSCAR) 5 MG tablet TAKE 1 TABLET EVERY DAY 90 tablet 1  ? glimepiride (AMARYL) 4 MG tablet TAKE 1 TABLET EVERY DAY BEFORE BREAKFAST 90 tablet 1  ? metFORMIN (GLUCOPHAGE) 500 MG tablet TAKE 2 TABLETS TWICE A DAY WITH MEALS 360 tablet 0  ? metoprolol tartrate (LOPRESSOR) 25 MG tablet Take 1/2 tablets (12.5 mg total) by mouth 2 (two) times daily. (Patient taking differently: Take 25 mg by mouth daily.) 180 tablet 3  ? Multiple Vitamin (MULTIVITAMIN WITH MINERALS) TABS tablet Take 1 tablet by mouth daily.    ? omeprazole (PRILOSEC) 40 MG capsule TAKE 1 CAPSULE EVERY DAY 90 capsule 1  ? pioglitazone (ACTOS) 15 MG tablet TAKE 1 TABLET EVERY DAY 90 tablet 1  ? rosuvastatin (CRESTOR) 5 MG tablet TAKE 1 TABLET EVERY DAY 90 tablet 1  ? sodium polystyrene (KAYEXALATE) 15 GM/60ML suspension Take 60 milliliters (15 grams total) by mouth daily for 4 days. 240 mL 0  ? traMADol (ULTRAM) 50 MG tablet Take 1 tablet by mouth every 6 hours as needed for pain 50 tablet 0  ? traMADol (ULTRAM) 50 MG tablet Take 1 tablet(s) by mouth daily as needed for pain. 90 tablet 0  ? zolpidem (AMBIEN) 10 MG tablet Take 1 tablet (10 mg total) by mouth at bedtime as needed for sleep. 90 tablet 1  ? nitroGLYCERIN (NITROSTAT) 0.4 MG SL tablet PLACE 1 TABLET UNDER THE TONGUE EVERY 5 MINUTES AS NEEDED FOR CHESTPAIN 25 tablet 3  ? ?No facility-administered medications prior to visit.  ? ? ? ?Social History  ? ?Socioeconomic History  ? Marital status: Married  ?  Spouse name: Not on file  ? Number of children: 1  ? Years of education: Not on file  ? Highest education level: Not on file  ?Occupational History  ? Occupation: Retired  ?Tobacco Use  ? Smoking status: Never  ? Smokeless tobacco: Never  ?Substance and Sexual Activity  ? Alcohol use: No  ? Drug use: No  ? Sexual activity: Not on file  ?Other Topics Concern  ? Not on file  ?Social History  Narrative  ? Lives in [place], [place] with wife. No pets. No dietary restrictions  ?   ? Retired from truck driving, [employer]  ? No cigarettes, alcohol to excess  ? Seat belts every time.   ? ?Social Determinants of Health  ? ?Financial Resource Strain: Low Risk   ? Difficulty of Paying Living Expenses: Not hard at all  ?Food Insecurity: No Food Insecurity  ? Worried About Charity fundraiser in the Last Year: Never true  ? Ran Out of Food in the Last Year: Never true  ?Transportation Needs: No Transportation  Needs  ? Lack of Transportation (Medical): No  ? Lack of Transportation (Non-Medical): No  ?Physical Activity: Inactive  ? Days of Exercise per Week: 0 days  ? Minutes of Exercise per Session: 0 min  ?Stress: No Stress Concern Present  ? Feeling of Stress : Not at all  ?Social Connections: Moderately Integrated  ? Frequency of Communication with Friends and Family: More than three times a week  ? Frequency of Social Gatherings with Friends and Family: More than three times a week  ? Attends Religious Services: More than 4 times per year  ? Active Member of Clubs or Organizations: No  ? Attends Archivist Meetings: Never  ? Marital Status: Married  ?Intimate Partner Violence: Not At Risk  ? Fear of Current or Ex-Partner: No  ? Emotionally Abused: No  ? Physically Abused: No  ? Sexually Abused: No  ? ? ? ? ?Review of Systems ?All other ros negative ? ?Objective:  ?  ?BP 138/72   Pulse 63   Resp 16   Ht '5\' 5"'$  (1.651 m)   Wt 153 lb (69.4 kg)   SpO2 98%   BMI 25.46 kg/m?  ?Nursing note and vital signs reviewed. ? ?Physical Exam ?General/constitutional: no distress, pleasant ?HEENT: Normocephalic, PER, Conj Clear, EOMI, Oropharynx clear ?Neck supple ?CV: rrr no mrg ?Lungs: clear to auscultation, normal respiratory effort ?Abd: Soft, Nontender ?Ext: no edema ?Skin: No Rash ?Neuro: nonfocal ?MSK: right shoulder no swelling, discoloration, tenderness, or fluctuance or warmth. Active ROM internal/external  is about 1/2 of normal range, and abduction is at 45-60 degree ? ? ?Labs: ?Lab Results  ?Component Value Date  ? WBC 6.9 07/24/2021  ? HGB 10.7 (L) 07/24/2021  ? HCT 32.3 (L) 07/24/2021  ? MCV 94.9 07/24/2021  ?

## 2021-09-27 NOTE — Progress Notes (Signed)
? ?Subjective:  ? ? Patient ID: Hayden Garza, male    DOB: [birth year], 88 y.o.   MRN: [id number] ? ?Chief Complaint  ?Patient presents with  ? Annual Exam  ? ? ?HPI ?Patient is in today for his annual physical exam and follow-up on chronic medical concerns.  He is accompanied by his wife today and they report no acute concerns.  No recent febrile illness or hospitalizations.  Zolpidem appears to be helping his sleep although at 5 mg it was not as helpful.  At 10 mg he sleeps well.  He denies any obvious blood in his stool or recent acute concerns. Well controlled, no changes to meds. Encouraged heart healthy diet such as the DASH diet and exercise as tolerated. They stay active and try and maintain a heart healthy diet.  ? ?Past Medical History:  ?Diagnosis Date  ? Allergic rhinitis   ? Anemia   ? BPH (benign prostatic hyperplasia)   ? With obstruction/lower urinary tract symptoms. Takes cardura for prostate.   ? CAD (coronary artery disease)   ? a. 05/02/2015: DES to mid LAD  ? Diabetes mellitus   ? type 2  ? Essential hypertension 06/15/2015  ? GERD (gastroesophageal reflux disease)   ? controled on Prilosec  ? History of urinary calculi   ? Incidental pulmonary nodule   ? Kidney stone   ? Mixed hyperlipidemia   ? Skin cancer   ? 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 ? ?Past Surgical History:  ?Procedure Laterality Date  ? APPENDECTOMY    ? BACK SURGERY    ? vertebral collapse in lower back had insturment, reports no metal  ? CARDIAC CATHETERIZATION N/A 05/02/2015  ? Procedure: Left Heart Cath and Coronary Angiography;  Surgeon: Lorretta Harp, MD;  Location: [facility];  Service: Cardiovascular;  Laterality: N/A;  ? CARDIAC CATHETERIZATION N/A 05/02/2015  ? Procedure: Coronary Stent Intervention;  Surgeon: Lorretta Harp, MD;  Location: [facility];  Service: Cardiovascular;  Laterality: N/A;  ? CORONARY ANGIOPLASTY WITH STENT PLACEMENT    ? JOINT REPLACEMENT Right   ? 6 surgeries including total  shoulder replacement roughly in 2000  ? MOHS SURGERY    ? TOTAL SHOULDER REPLACEMENT    ? TOTAL SHOULDER REVISION Right 04/20/2021  ? Procedure: Removal of infected right shoulder arthroplasty, implantation of cement spacer;  Surgeon: Justice Britain, MD;  Location: [facility];  Service: Orthopedics;  Laterality: Right;  ? ? ?Family History  ?Problem Relation Age of Onset  ? Heart disease Mother   ?     chf  ? Heart attack Father   ? CAD Father   ? Colitis Sister   ? Other Brother   ?     head trauma  ? Heart disease Brother   ? Hyperlipidemia Brother   ? Hypertension Brother   ? Diabetes Brother   ? Kidney failure Brother   ? Diabetes Son   ? Kidney failure Maternal Aunt   ? ? ?Social History  ? ?Socioeconomic History  ? Marital status: Married  ?  Spouse name: Not on file  ? Number of children: 1  ? Years of education: Not on file  ? Highest education level: Not on file  ?Occupational History  ? Occupation: Retired  ?Tobacco Use  ? Smoking status: Never  ? Smokeless tobacco: Never  ?Substance and Sexual Activity  ? Alcohol use: No  ? Drug use: No  ? Sexual activity: Not on  file  ?Other Topics Concern  ? Not on file  ?Social History Narrative  ? Lives in [place], [place] with wife. No pets. No dietary restrictions  ?   ? Retired from truck driving, [employer]  ? No cigarettes, alcohol to excess  ? Seat belts every time.   ? ?Social Determinants of Health  ? ?Financial Resource Strain: Low Risk   ? Difficulty of Paying Living Expenses: Not hard at all  ?Food Insecurity: No Food Insecurity  ? Worried About Charity fundraiser in the Last Year: Never true  ? Ran Out of Food in the Last Year: Never true  ?Transportation Needs: No Transportation Needs  ? Lack of Transportation (Medical): No  ? Lack of Transportation (Non-Medical): No  ?Physical Activity: Inactive  ? Days of Exercise per Week: 0 days  ? Minutes of Exercise per Session: 0 min  ?Stress: No Stress Concern Present  ? Feeling of Stress : Not at all  ?Social  Connections: Moderately Integrated  ? Frequency of Communication with Friends and Family: More than three times a week  ? Frequency of Social Gatherings with Friends and Family: More than three times a week  ? Attends Religious Services: More than 4 times per year  ? Active Member of Clubs or Organizations: No  ? Attends Archivist Meetings: Never  ? Marital Status: Married  ?Intimate Partner Violence: Not At Risk  ? Fear of Current or Ex-Partner: No  ? Emotionally Abused: No  ? Physically Abused: No  ? Sexually Abused: No  ? ? ?Outpatient Medications Prior to Visit  ?Medication Sig Dispense Refill  ? aspirin EC 81 MG tablet Take 4 tablets (325 mg total) by mouth daily. (Patient taking differently: Take 81 mg by mouth daily.)    ? chlorhexidine (PERIDEX) 0.12 % solution After brushing, rinse with one capful for one minute twice a day then spit out 473 mL 0  ? Cholecalciferol (VITAMIN D) 50 MCG (2000 UT) tablet Take 2,000 Units by mouth daily.    ? Coenzyme Q10 (COQ10) 100 MG CAPS Take 100 mg by mouth daily.    ? doxazosin (CARDURA) 8 MG tablet TAKE 1 TABLET AT BEDTIME 90 tablet 1  ? doxycycline (VIBRA-TABS) 100 MG tablet Take 1 tablet (100 mg total) by mouth 2 (two) times daily. 60 tablet 5  ? ferrous gluconate (FERGON) 240 (27 FE) MG tablet Take 240 mg by mouth daily.    ? finasteride (PROSCAR) 5 MG tablet TAKE 1 TABLET EVERY DAY 90 tablet 1  ? glimepiride (AMARYL) 4 MG tablet TAKE 1 TABLET EVERY DAY BEFORE BREAKFAST 90 tablet 1  ? metFORMIN (GLUCOPHAGE) 500 MG tablet TAKE 2 TABLETS TWICE A DAY WITH MEALS 360 tablet 0  ? metoprolol tartrate (LOPRESSOR) 25 MG tablet Take 1/2 tablets (12.5 mg total) by mouth 2 (two) times daily. (Patient taking differently: Take 25 mg by mouth daily.) 180 tablet 3  ? Multiple Vitamin (MULTIVITAMIN WITH MINERALS) TABS tablet Take 1 tablet by mouth daily.    ? omeprazole (PRILOSEC) 40 MG capsule TAKE 1 CAPSULE EVERY DAY 90 capsule 1  ? pioglitazone (ACTOS) 15 MG tablet TAKE 1  TABLET EVERY DAY 90 tablet 1  ? rosuvastatin (CRESTOR) 5 MG tablet TAKE 1 TABLET EVERY DAY 90 tablet 1  ? sodium polystyrene (KAYEXALATE) 15 GM/60ML suspension Take 60 milliliters (15 grams total) by mouth daily for 4 days. 240 mL 0  ? traMADol (ULTRAM) 50 MG tablet Take 1 tablet by  mouth every 6 hours as needed for pain 50 tablet 0  ? traMADol (ULTRAM) 50 MG tablet Take 1 tablet(s) by mouth daily as needed for pain. 90 tablet 0  ? zolpidem (AMBIEN) 10 MG tablet Take 1 tablet (10 mg total) by mouth at bedtime as needed for sleep. 90 tablet 1  ? nitroGLYCERIN (NITROSTAT) 0.4 MG SL tablet PLACE 1 TABLET UNDER THE TONGUE EVERY 5 MINUTES AS NEEDED FOR CHESTPAIN 25 tablet 3  ? ?No facility-administered medications prior to visit.  ? ? ?Allergies  ?Allergen Reactions  ? Lipitor [Atorvastatin]   ?  Joint and muscle pain  ? Penicillins   ?  swelling in joints  ? ? ?Review of Systems  ?Constitutional:  Negative for chills, fever and malaise/fatigue.  ?HENT:  Negative for congestion and hearing loss.   ?Eyes:  Negative for discharge.  ?Respiratory:  Negative for cough, sputum production and shortness of breath.   ?Cardiovascular:  Negative for chest pain, palpitations and leg swelling.  ?Gastrointestinal:  Negative for abdominal pain, blood in stool, constipation, diarrhea, heartburn, nausea and vomiting.  ?Genitourinary:  Negative for dysuria, frequency, hematuria and urgency.  ?Musculoskeletal:  Negative for back pain, falls and myalgias.  ?Skin:  Negative for rash.  ?Neurological:  Negative for dizziness, sensory change, loss of consciousness, weakness and headaches.  ?Endo/Heme/Allergies:  Negative for environmental allergies. Does not bruise/bleed easily.  ?Psychiatric/Behavioral:  Negative for depression and suicidal ideas. The patient has insomnia. The patient is not nervous/anxious.   ? ?   ?Objective:  ?  ?Physical Exam ?Constitutional:   ?   General: He is not in acute distress. ?   Appearance: Normal appearance. He  is not ill-appearing or toxic-appearing.  ?HENT:  ?   Head: Normocephalic and atraumatic.  ?   Right Ear: Tympanic membrane, ear canal and external ear normal.  ?   Left Ear: Tympanic membrane, ear canal and ext

## 2021-09-28 ENCOUNTER — Encounter: Payer: Self-pay | Admitting: Family Medicine

## 2021-09-28 ENCOUNTER — Ambulatory Visit (INDEPENDENT_AMBULATORY_CARE_PROVIDER_SITE_OTHER): Payer: Medicare HMO | Admitting: Family Medicine

## 2021-09-28 VITALS — BP 128/70 | HR 67 | Resp 20 | Ht 65.0 in | Wt 152.0 lb

## 2021-09-28 DIAGNOSIS — E119 Type 2 diabetes mellitus without complications: Secondary | ICD-10-CM | POA: Diagnosis not present

## 2021-09-28 DIAGNOSIS — G4700 Insomnia, unspecified: Secondary | ICD-10-CM

## 2021-09-28 DIAGNOSIS — C4491 Basal cell carcinoma of skin, unspecified: Secondary | ICD-10-CM

## 2021-09-28 DIAGNOSIS — E7800 Pure hypercholesterolemia, unspecified: Secondary | ICD-10-CM | POA: Diagnosis not present

## 2021-09-28 DIAGNOSIS — N289 Disorder of kidney and ureter, unspecified: Secondary | ICD-10-CM | POA: Diagnosis not present

## 2021-09-28 DIAGNOSIS — R700 Elevated erythrocyte sedimentation rate: Secondary | ICD-10-CM

## 2021-09-28 DIAGNOSIS — D649 Anemia, unspecified: Secondary | ICD-10-CM

## 2021-09-28 DIAGNOSIS — Z0000 Encounter for general adult medical examination without abnormal findings: Secondary | ICD-10-CM | POA: Insufficient documentation

## 2021-09-28 DIAGNOSIS — I10 Essential (primary) hypertension: Secondary | ICD-10-CM

## 2021-09-28 DIAGNOSIS — M009 Pyogenic arthritis, unspecified: Secondary | ICD-10-CM

## 2021-09-28 DIAGNOSIS — G8929 Other chronic pain: Secondary | ICD-10-CM

## 2021-09-28 LAB — COMPREHENSIVE METABOLIC PANEL
ALT: 10 U/L (ref 0–53)
AST: 15 U/L (ref 0–37)
Albumin: 4.3 g/dL (ref 3.5–5.2)
Alkaline Phosphatase: 54 U/L (ref 39–117)
BUN: 23 mg/dL (ref 6–23)
CO2: 27 mEq/L (ref 19–32)
Calcium: 9.5 mg/dL (ref 8.4–10.5)
Chloride: 109 mEq/L (ref 96–112)
Creatinine, Ser: 1.48 mg/dL (ref 0.40–1.50)
GFR: 42.5 mL/min — ABNORMAL LOW (ref >60.00)
Glucose, Bld: 94 mg/dL (ref 70–99)
Potassium: 5.5 mEq/L — ABNORMAL HIGH (ref 3.5–5.1)
Sodium: 144 mEq/L (ref 135–145)
Total Bilirubin: 0.5 mg/dL (ref 0.2–1.2)
Total Protein: 6.6 g/dL (ref 6.0–8.3)

## 2021-09-28 LAB — CBC WITH DIFFERENTIAL/PLATELET
Basophils Absolute: 0.1 10*3/uL (ref 0.0–0.1)
Basophils Relative: 0.8 % (ref 0.0–3.0)
Eosinophils Absolute: 0.1 10*3/uL (ref 0.0–0.7)
Eosinophils Relative: 1.8 % (ref 0.0–5.0)
HCT: 32 % — ABNORMAL LOW (ref 39.0–52.0)
Hemoglobin: 10.6 g/dL — ABNORMAL LOW (ref 13.0–17.0)
Lymphocytes Relative: 28.5 % (ref 12.0–46.0)
Lymphs Abs: 2.3 10*3/uL (ref 0.7–4.0)
MCHC: 33.2 g/dL (ref 30.0–36.0)
MCV: 98.3 fl (ref 78.0–100.0)
Monocytes Absolute: 0.7 10*3/uL (ref 0.1–1.0)
Monocytes Relative: 9.3 % (ref 3.0–12.0)
Neutro Abs: 4.8 10*3/uL (ref 1.4–7.7)
Neutrophils Relative %: 59.6 % (ref 43.0–77.0)
Platelets: 172 10*3/uL (ref 150.0–400.0)
RBC: 3.25 Mil/uL — ABNORMAL LOW (ref 4.22–5.81)
RDW: 15.5 % (ref 11.5–15.5)
WBC: 8 10*3/uL (ref 4.0–10.5)

## 2021-09-28 LAB — SEDIMENTATION RATE: Sed Rate: 11 mm/hr (ref 0–20)

## 2021-09-28 NOTE — Assessment & Plan Note (Signed)
Is tolerating oral iron and anemia was improving. Recheck cbc, iron studies and iFOB today. He is asymptomatic.  ?

## 2021-09-28 NOTE — Patient Instructions (Signed)
Preventive Care 65 Years and Older, Male Preventive care refers to lifestyle choices and visits with your health care provider that can promote health and wellness. Preventive care visits are also called wellness exams. What can I expect for my preventive care visit? Counseling During your preventive care visit, your health care provider may ask about your: Medical history, including: Past medical problems. Family medical history. History of falls. Current health, including: Emotional well-being. Home life and relationship well-being. Sexual activity. Memory and ability to understand (cognition). Lifestyle, including: Alcohol, nicotine or tobacco, and drug use. Access to firearms. Diet, exercise, and sleep habits. Work and work environment. Sunscreen use. Safety issues such as seatbelt and bike helmet use. Physical exam Your health care provider will check your: Height and weight. These may be used to calculate your BMI (body mass index). BMI is a measurement that tells if you are at a healthy weight. Waist circumference. This measures the distance around your waistline. This measurement also tells if you are at a healthy weight and may help predict your risk of certain diseases, such as type 2 diabetes and high blood pressure. Heart rate and blood pressure. Body temperature. Skin for abnormal spots. What immunizations do I need?  Vaccines are usually given at various ages, according to a schedule. Your health care provider will recommend vaccines for you based on your age, medical history, and lifestyle or other factors, such as travel or where you work. What tests do I need? Screening Your health care provider may recommend screening tests for certain conditions. This may include: Lipid and cholesterol levels. Diabetes screening. This is done by checking your blood sugar (glucose) after you have not eaten for a while (fasting). Hepatitis C test. Hepatitis B test. HIV (human  immunodeficiency virus) test. STI (sexually transmitted infection) testing, if you are at risk. Lung cancer screening. Colorectal cancer screening. Prostate cancer screening. Abdominal aortic aneurysm (AAA) screening. You may need this if you are a current or former smoker. Talk with your health care provider about your test results, treatment options, and if necessary, the need for more tests. Follow these instructions at home: Eating and drinking  Eat a diet that includes fresh fruits and vegetables, whole grains, lean protein, and low-fat dairy products. Limit your intake of foods with high amounts of sugar, saturated fats, and salt. Take vitamin and mineral supplements as recommended by your health care provider. Do not drink alcohol if your health care provider tells you not to drink. If you drink alcohol: Limit how much you have to 0-2 drinks a day. Know how much alcohol is in your drink. In the [place] one drink equals one 12 oz bottle of beer (355 mL), one 5 oz glass of wine (148 mL), or one 1 oz glass of hard liquor (44 mL). Lifestyle Brush your teeth every morning and night with fluoride toothpaste. Floss one time each day. Exercise for at least 30 minutes 5 or more days each week. Do not use any products that contain nicotine or tobacco. These products include cigarettes, chewing tobacco, and vaping devices, such as e-cigarettes. If you need help quitting, ask your health care provider. Do not use drugs. If you are sexually active, practice safe sex. Use a condom or other form of protection to prevent STIs. Take aspirin only as told by your health care provider. Make sure that you understand how much to take and what form to take. Work with your health care provider to find out whether it is safe   and beneficial for you to take aspirin daily. Ask your health care provider if you need to take a cholesterol-lowering medicine (statin). Find healthy ways to manage stress, such  as: Meditation, yoga, or listening to music. Journaling. Talking to a trusted person. Spending time with friends and family. Safety Always wear your seat belt while driving or riding in a vehicle. Do not drive: If you have been drinking alcohol. Do not ride with someone who has been drinking. When you are tired or distracted. While texting. If you have been using any mind-altering substances or drugs. Wear a helmet and other protective equipment during sports activities. If you have firearms in your house, make sure you follow all gun safety procedures. Minimize exposure to UV radiation to reduce your risk of skin cancer. What's next? Visit your health care provider once a year for an annual wellness visit. Ask your health care provider how often you should have your eyes and teeth checked. Stay up to date on all vaccines. This information is not intended to replace advice given to you by your health care provider. Make sure you discuss any questions you have with your health care provider. Document Revised: 11/23/2020 Document Reviewed: 11/23/2020 Elsevier Patient Education  2023 Elsevier Inc.  

## 2021-09-28 NOTE — Assessment & Plan Note (Signed)
They have placed him on an antibiotic for a year to manage a smouldering infection and that has helped her pain. He is taking a daily multistrain probiotic and that is helping his ability to tolerate the antibiotic ?

## 2021-09-28 NOTE — Assessment & Plan Note (Signed)
Follows with dermatology twice a year. ?

## 2021-09-28 NOTE — Assessment & Plan Note (Signed)
Well controlled, no changes to meds. Encouraged heart healthy diet such as the DASH diet and exercise as tolerated.  °

## 2021-09-28 NOTE — Assessment & Plan Note (Signed)
hgba1c acceptable, minimize simple carbs. Increase exercise as tolerated. Continue current meds 

## 2021-09-28 NOTE — Assessment & Plan Note (Signed)
Encouraged good sleep hygiene such as dark, quiet room. No blue/green glowing lights such as computer screens in bedroom. No alcohol or stimulants in evening. Cut down on caffeine as able. Regular exercise is helpful but not just prior to bed time. Tried reducing his zolpidem to 5 mg and he was unable to sleep but with the 10 mg he sleeps and they deny any concerning side effects ?

## 2021-09-28 NOTE — Assessment & Plan Note (Signed)
Hydrate and monitor 

## 2021-09-28 NOTE — Assessment & Plan Note (Signed)
Patient encouraged to maintain heart healthy diet, regular exercise, adequate sleep. Consider daily probiotics. Take medications as prescribed. Labs ordered and reviewed. Has aged out of colonoscopies for screening purposes but discussed the need to consider scope if anemia and blood in stool worsens. Shingrix is the new shingles shot, 2 shots over 2-6 months, confirm coverage with insurance and document, then can return here for shots with nurse appt or at pharmacy ?

## 2021-09-29 ENCOUNTER — Other Ambulatory Visit: Payer: Self-pay

## 2021-09-29 DIAGNOSIS — E119 Type 2 diabetes mellitus without complications: Secondary | ICD-10-CM

## 2021-09-29 DIAGNOSIS — E875 Hyperkalemia: Secondary | ICD-10-CM

## 2021-09-29 LAB — IRON,TIBC AND FERRITIN PANEL
%SAT: 28 % (calc) (ref 20–48)
Ferritin: 14 ng/mL — ABNORMAL LOW (ref 24–380)
Iron: 91 ug/dL (ref 50–180)
TIBC: 325 mcg/dL (calc) (ref 250–425)

## 2021-10-05 ENCOUNTER — Other Ambulatory Visit (INDEPENDENT_AMBULATORY_CARE_PROVIDER_SITE_OTHER): Payer: Medicare HMO

## 2021-10-05 ENCOUNTER — Telehealth: Payer: Self-pay | Admitting: *Deleted

## 2021-10-05 DIAGNOSIS — E119 Type 2 diabetes mellitus without complications: Secondary | ICD-10-CM

## 2021-10-05 DIAGNOSIS — D649 Anemia, unspecified: Secondary | ICD-10-CM | POA: Diagnosis not present

## 2021-10-05 DIAGNOSIS — E875 Hyperkalemia: Secondary | ICD-10-CM | POA: Diagnosis not present

## 2021-10-05 LAB — COMPREHENSIVE METABOLIC PANEL
ALT: 10 U/L (ref 0–53)
AST: 16 U/L (ref 0–37)
Albumin: 4.2 g/dL (ref 3.5–5.2)
Alkaline Phosphatase: 51 U/L (ref 39–117)
BUN: 29 mg/dL — ABNORMAL HIGH (ref 6–23)
CO2: 24 mEq/L (ref 19–32)
Calcium: 9.3 mg/dL (ref 8.4–10.5)
Chloride: 107 mEq/L (ref 96–112)
Creatinine, Ser: 1.6 mg/dL — ABNORMAL HIGH (ref 0.40–1.50)
GFR: 38.7 mL/min — ABNORMAL LOW (ref >60.00)
Glucose, Bld: 103 mg/dL — ABNORMAL HIGH (ref 70–99)
Potassium: 5 mEq/L (ref 3.5–5.1)
Sodium: 141 mEq/L (ref 135–145)
Total Bilirubin: 0.5 mg/dL (ref 0.2–1.2)
Total Protein: 6.5 g/dL (ref 6.0–8.3)

## 2021-10-05 LAB — HEMOGLOBIN A1C: Hgb A1c MFr Bld: 6.8 % — ABNORMAL HIGH (ref 4.6–6.5)

## 2021-10-05 LAB — FECAL OCCULT BLOOD, IMMUNOCHEMICAL: Fecal Occult Bld: POSITIVE — AB

## 2021-10-05 NOTE — Telephone Encounter (Signed)
Received call from Harmon at [facility] reporting Positive IFOB. ?

## 2021-10-06 NOTE — Telephone Encounter (Signed)
Pt advised of results. Will let us know what he decides about GI referral.  ?

## 2021-10-19 ENCOUNTER — Other Ambulatory Visit: Payer: Self-pay | Admitting: Internal Medicine

## 2021-10-19 ENCOUNTER — Other Ambulatory Visit (HOSPITAL_BASED_OUTPATIENT_CLINIC_OR_DEPARTMENT_OTHER): Payer: Self-pay

## 2021-10-19 NOTE — Telephone Encounter (Signed)
Okay to refill? Doxy is no longer on his current medication list. Thanks  ?

## 2021-10-20 ENCOUNTER — Other Ambulatory Visit (HOSPITAL_BASED_OUTPATIENT_CLINIC_OR_DEPARTMENT_OTHER): Payer: Self-pay

## 2021-10-20 MED ORDER — DOXYCYCLINE HYCLATE 100 MG PO TABS
100.0000 mg | ORAL_TABLET | Freq: Two times a day (BID) | ORAL | 5 refills | Status: AC
  Filled 2021-10-20: qty 60, 30d supply, fill #0
  Filled 2021-11-22: qty 60, 30d supply, fill #1
  Filled 2021-12-26: qty 60, 30d supply, fill #2
  Filled 2022-01-29: qty 60, 30d supply, fill #3
  Filled 2022-02-28: qty 60, 30d supply, fill #4
  Filled 2022-03-28: qty 60, 30d supply, fill #5

## 2021-10-23 ENCOUNTER — Other Ambulatory Visit (HOSPITAL_BASED_OUTPATIENT_CLINIC_OR_DEPARTMENT_OTHER): Payer: Self-pay

## 2021-10-24 ENCOUNTER — Other Ambulatory Visit (HOSPITAL_BASED_OUTPATIENT_CLINIC_OR_DEPARTMENT_OTHER): Payer: Self-pay

## 2021-10-27 ENCOUNTER — Telehealth: Payer: Self-pay | Admitting: Family Medicine

## 2021-10-27 NOTE — Telephone Encounter (Signed)
pt had contacted [facility] from recent referral sent by [facility].. pt was advised no records and to reach back out to facility

## 2021-11-14 ENCOUNTER — Other Ambulatory Visit (HOSPITAL_COMMUNITY): Payer: Self-pay

## 2021-11-15 ENCOUNTER — Other Ambulatory Visit (HOSPITAL_COMMUNITY): Payer: Self-pay

## 2021-11-22 ENCOUNTER — Other Ambulatory Visit (HOSPITAL_BASED_OUTPATIENT_CLINIC_OR_DEPARTMENT_OTHER): Payer: Self-pay

## 2021-11-29 ENCOUNTER — Other Ambulatory Visit: Payer: Self-pay | Admitting: Family Medicine

## 2021-12-14 ENCOUNTER — Other Ambulatory Visit: Payer: Self-pay | Admitting: Family Medicine

## 2021-12-19 ENCOUNTER — Encounter: Payer: Self-pay | Admitting: Internal Medicine

## 2021-12-19 ENCOUNTER — Other Ambulatory Visit: Payer: Self-pay

## 2021-12-19 ENCOUNTER — Ambulatory Visit: Payer: Medicare HMO | Admitting: Internal Medicine

## 2021-12-19 VITALS — BP 127/73 | HR 71 | Temp 97.7°F | Wt 154.0 lb

## 2021-12-19 DIAGNOSIS — T8450XD Infection and inflammatory reaction due to unspecified internal joint prosthesis, subsequent encounter: Secondary | ICD-10-CM | POA: Diagnosis not present

## 2021-12-19 DIAGNOSIS — Z96611 Presence of right artificial shoulder joint: Secondary | ICD-10-CM

## 2021-12-19 NOTE — Progress Notes (Signed)
[facility] [facility] for Infectious Disease  Patient Active Problem List   Diagnosis Date Noted   Preventative health care 09/28/2021   Septic joint of right shoulder region [facility]) 04/20/2021   Insomnia 12/24/2020   Renal insufficiency 06/26/2020   BCC (basal cell carcinoma of skin) 06/26/2020   Right shoulder pain 06/26/2020   CAD (coronary artery disease) 06/15/2015   Essential hypertension 06/15/2015   Anemia 06/15/2015   NSTEMI (non-ST elevated myocardial infarction) ([place]) 04/30/2015   Dyspnea 05/22/2013   Hyperlipidemia 05/22/2013   Diabetes mellitus type 2, noninsulin dependent ([place]) 10/17/2009   ALLERGIC RHINITIS 10/17/2009   PULMONARY NODULE, RIGHT LOWER LOBE 10/17/2009      Subjective:    Patient ID: Hayden Garza, male    DOB: 1932-10-09, 88 y.o.   MRN: [id number]  Cc: f/u prosthetic shoulder infection  HPI:  Hayden Garza is a 88 y.o. 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 The tissue culture "normal flora" has p acnes growing with mixed flora (no strep group a/staph). The other 2 samples only grew p-acnes and was collected 1700. The one with mixed flora was collected 1730   Patient reported that his surgeon has no plan at this time to replace a new joint as his bone stock is minimal  No diarrhea, n/v He is actually constipated  He does report a little irritation at the end of his urination; no urgency. Has chronic frequency every few hours. No blood  12/06 id clinic f/u I had patient continuing on doxycycline only and stopped cefadroxil for gram negative coverage. The aspirate of his joint only grew p-acnes while the operative cx grew both  p-acnes and some other gram positive flora (only 1 sample) which I suspect is either lab contaminant No f/c Pain improving  06/21/21 id clinic f/u Moderate pain stable Nausea but able to eat Yellowish/red discoloration anterior of the right shoulder No f/c No diarrhea No other rash Last good bowel movement a week ago. Just a small one the morning of this visit No abdominal pain   08/08/21 id f/u Patient was given zofran to take as needed for nausea due to doxy. He takes zofran once day Had severe allergy to penicillin in his 1's and haven't taken any pcn analogue since then. Took cefadroxil in 04/2021 though and tolerated He is ok with keeping on doxy/zofran for now Shoulder pain right side stable 5-6/10 since after surgery No fever, chill No diarrhea, but constipation   09/26/21 id fu Patient very happy today. Despite having only abx spacer in his right shoulder, he has minimal pain (compared to what he had the past several years even with the replacement joint). No f/c No further nausea. Not taking zofran His surgeon is offering him to proceed with the second stage replacement joint, but patient is at this time not thinking to get one   12/19/21 id f/u He is doing well He wants to avoid replacing his shoulder joint and just keep with the abx spacer No f/c No n/v/diarrhea No sun burn    Allergies  Allergen Reactions   Lipitor [Atorvastatin]  Joint and muscle pain   Penicillins     swelling in joints      Outpatient Medications Prior to Visit  Medication Sig Dispense Refill   aspirin EC 81 MG tablet Take 4 tablets (325 mg total) by mouth daily.     chlorhexidine (PERIDEX) 0.12 % solution After brushing, rinse with one capful for one minute twice a day then spit out 473 mL 0   Cholecalciferol (VITAMIN D) 50 MCG (2000 UT) tablet Take 2,000 Units by mouth daily.     Coenzyme Q10 (COQ10) 100 MG CAPS Take 100 mg by mouth daily.     doxazosin (CARDURA) 8 MG tablet  TAKE 1 TABLET AT BEDTIME 90 tablet 1   doxycycline (VIBRA-TABS) 100 MG tablet Take 1 tablet (100 mg total) by mouth 2 (two) times daily. 60 tablet 5   ferrous gluconate (FERGON) 240 (27 FE) MG tablet Take 240 mg by mouth daily.     finasteride (PROSCAR) 5 MG tablet TAKE 1 TABLET EVERY DAY 90 tablet 1   glimepiride (AMARYL) 4 MG tablet TAKE 1 TABLET EVERY DAY BEFORE BREAKFAST 90 tablet 1   metFORMIN (GLUCOPHAGE) 500 MG tablet TAKE 2 TABLETS TWICE A DAY WITH MEALS 360 tablet 0   metoprolol tartrate (LOPRESSOR) 25 MG tablet Take 0.5 tablets (12.5 mg total) by mouth 2 (two) times daily. 90 tablet 1   Multiple Vitamin (MULTIVITAMIN WITH MINERALS) TABS tablet Take 1 tablet by mouth daily.     omeprazole (PRILOSEC) 40 MG capsule TAKE 1 CAPSULE EVERY DAY 90 capsule 1   pioglitazone (ACTOS) 15 MG tablet TAKE 1 TABLET EVERY DAY 90 tablet 1   rosuvastatin (CRESTOR) 5 MG tablet TAKE 1 TABLET EVERY DAY 90 tablet 1   sodium polystyrene (KAYEXALATE) 15 GM/60ML suspension Take 60 milliliters (15 grams total) by mouth daily for 4 days. 240 mL 0   traMADol (ULTRAM) 50 MG tablet Take 1 tablet by mouth every 6 hours as needed for pain 50 tablet 0   traMADol (ULTRAM) 50 MG tablet Take 1 tablet(s) by mouth daily as needed for pain. 90 tablet 0   zolpidem (AMBIEN) 10 MG tablet Take 1 tablet (10 mg total) by mouth at bedtime as needed for sleep. 90 tablet 1   nitroGLYCERIN (NITROSTAT) 0.4 MG SL tablet PLACE 1 TABLET UNDER THE TONGUE EVERY 5 MINUTES AS NEEDED FOR CHESTPAIN 25 tablet 3   No facility-administered medications prior to visit.     Social History   Socioeconomic History   Marital status: Married    Spouse name: Not on file   Number of children: 1   Years of education: Not on file   Highest education level: Not on file  Occupational History   Occupation: Retired  Tobacco Use   Smoking status: Never   Smokeless tobacco: Never  Substance and Sexual Activity   Alcohol use: No   Drug use: No    Sexual activity: Not on file  Other Topics Concern   Not on file  Social History Narrative   Lives in [place], [place] with wife. No pets. No dietary restrictions      Retired from truck driving, long haul   No cigarettes, alcohol to excess   Seat belts every time.    Social Determinants of Health   Financial Resource Strain: Low Risk  (07/24/2021)   Overall Financial Resource Strain (CARDIA)    Difficulty of Paying Living Expenses: Not hard at all  Food Insecurity: No Food Insecurity (07/24/2021)  Hunger Vital Sign    Worried About Running Out of Food in the Last Year: Never true    Ran Out of Food in the Last Year: Never true  Transportation Needs: No Transportation Needs (07/24/2021)   PRAPARE - Hydrologist (Medical): No    Lack of Transportation (Non-Medical): No  Physical Activity: Inactive (07/24/2021)   Exercise Vital Sign    Days of Exercise per Week: 0 days    Minutes of Exercise per Session: 0 min  Stress: No Stress Concern Present (07/24/2021)   [place]    Feeling of Stress : Not at all  Social Connections: Moderately Integrated (07/24/2021)   Social Connection and Isolation Panel [NHANES]    Frequency of Communication with Friends and Family: More than three times a week    Frequency of Social Gatherings with Friends and Family: More than three times a week    Attends Religious Services: More than 4 times per year    Active Member of [employer] or Organizations: No    Attends Archivist Meetings: Never    Marital Status: Married  Human resources officer Violence: Not At Risk (07/24/2021)   Humiliation, Afraid, Rape, and Kick questionnaire    Fear of Current or Ex-Partner: No    Emotionally Abused: No    Physically Abused: No    Sexually Abused: No      Review of Systems All other ros negative  Objective:    BP 127/73   Pulse 71   Temp 97.7 F (36.5 C) (Temporal)    Wt 154 lb (69.9 kg)   BMI 25.63 kg/m  Nursing note and vital signs reviewed.  Physical Exam General/constitutional: no distress, pleasant HEENT: Normocephalic, PER, Conj Clear, EOMI, Oropharynx clear Neck supple CV: rrr no mrg Lungs: clear to auscultation, normal respiratory effort Abd: Soft, Nontender Ext: no edema Skin: No Rash Neuro: nonfocal  MSK: stable exam -- right shoulder no swelling, discoloration, tenderness, or fluctuance or warmth. Active ROM internal/external is about 1/2 of normal range, and abduction is at 45-60 degree   Labs: Lab Results  Component Value Date   WBC 8.0 09/28/2021   HGB 10.6 (L) 09/28/2021   HCT 32.0 (L) 09/28/2021   MCV 98.3 09/28/2021   PLT 172.0 35/45/6256   Last metabolic panel Lab Results  Component Value Date   GLUCOSE 103 (H) 10/05/2021   NA 141 10/05/2021   K 5.0 10/05/2021   CL 107 10/05/2021   CO2 24 10/05/2021   BUN 29 (H) 10/05/2021   CREATININE 1.60 (H) 10/05/2021   GFRNONAA 46 (L) 04/24/2021   CALCIUM 9.3 10/05/2021   PROT 6.5 10/05/2021   ALBUMIN 4.2 10/05/2021   BILITOT 0.5 10/05/2021   ALKPHOS 51 10/05/2021   AST 16 10/05/2021   ALT 10 10/05/2021   ANIONGAP 10 04/20/2021    Micro:  Serology:  Imaging:  Assessment & Plan:   Problem List Items Addressed This Visit   None Visit Diagnoses     Infection of prosthetic joint, subsequent encounter    -  Primary   Relevant Orders   CBC   COMPLETE METABOLIC PANEL WITH GFR   C-reactive protein      No orders of the defined types were placed in this encounter.  Abx: 11/22-c Doxycycline (brief cefadroxil)  11/14-22 ceftriaxone   88 year old male with complicated history of right shoulder interventions started in the 1990s,  underwent right shoulder reverse arthroplasty  0211 complicated by right should pain which was worked up in 2015 admitted for right shoulder PJI.  He had presented to orthopedic clinic for a 3 cm mass over the right shoulder  enlarging over the past couple of days.  s/p right shoulder PJI SP hardware removal and cement placement on 04/20/21     11/09 cx p-acnes but one plate with gpc/gpr. Suspect contaminant gpc/gpr (not worked up but no staph/strep). Aspirate cx only p-acnes  12/06 assessment Patient had continued only doxycycline since 12/01 He is doing well  His surgeon did mention there was some retained cement that couldn't be removed and we had agreed on continue indefinite antibiotics or at least 6-12 months. At this time there is no plan for a new shoulder surgery as there is no bone stock for that   06/21/21 assessment Unclear what the discoloration is, almost like a bruise. Not cellulitic Clinically stable on doxycycline suppression He is to see his orthopedic surgeon today Constipated making nausea/abx side effect worse?  08/08/21 assessment Constipation - miralax helping. Discuss prune juice/prune-cereal Nausea - controlled with zofran. Not wanting to switch to amoxicillin due to hx severe pcn alergy. Tolerated cefadroxil which potentially could be another abx for p-acnes  09/26/21 assessment Doing well His surgeon is comfortable discussing 2nd stage but patient at this time doesn't want a new joint as he is almost pain free and has the range of motion to do his normal activities. He is also tolerating doxycycline very well without further nausea  November 2023 would be the 12 months. We discussed he could consider if he/and his surgeon are comfortable to trial off doxy at that time  Also discussed if he wants to proceed with second stage revision, let me know so we could coordinate timing off antibiotics to sample the joint before proceeding  Follow up in 3 months with labs at that time   12/19/21 assessment Doing well on doxy Plan to go 12 months until end of 04/2022 with abx and stop He still doesn't want a new joint  He is to discuss the above 2 issues with his surgeon especially trial off  abx on 04/2022  Follow up with me 05/2022 Continue doxy for now  Follow-up: Return in about 5 months (around 05/21/2022).      Jabier Mutton, [place] [facility] [place] [phone]  pager   [phone] cell 12/19/2021, 9:34 AM

## 2021-12-19 NOTE — Patient Instructions (Addendum)
Please do blood tests today    Continue antibiotics doxycycline until at least end of 04/2022   Discuss with your surgeons these 2 issues: Regarding your decision to replace your joint or keep going like it is At the end of 04/2022 is when we can trial you off of antibiotics; does he feel comfortable with this   See me in 05/2022

## 2021-12-20 LAB — COMPLETE METABOLIC PANEL WITH GFR
AG Ratio: 2 (calc) (ref 1.0–2.5)
ALT: 12 U/L (ref 9–46)
AST: 16 U/L (ref 10–35)
Albumin: 4.2 g/dL (ref 3.6–5.1)
Alkaline phosphatase (APISO): 52 U/L (ref 35–144)
BUN/Creatinine Ratio: 15 (calc) (ref 6–22)
BUN: 25 mg/dL (ref 7–25)
CO2: 26 mmol/L (ref 20–32)
Calcium: 9.1 mg/dL (ref 8.6–10.3)
Chloride: 110 mmol/L (ref 98–110)
Creat: 1.63 mg/dL — ABNORMAL HIGH (ref 0.70–1.22)
Globulin: 2.1 g/dL (calc) (ref 1.9–3.7)
Glucose, Bld: 138 mg/dL — ABNORMAL HIGH (ref 65–99)
Potassium: 4.9 mmol/L (ref 3.5–5.3)
Sodium: 142 mmol/L (ref 135–146)
Total Bilirubin: 0.5 mg/dL (ref 0.2–1.2)
Total Protein: 6.3 g/dL (ref 6.1–8.1)
eGFR: 41 mL/min/{1.73_m2} — ABNORMAL LOW (ref >=60)

## 2021-12-20 LAB — CBC
HCT: 32.3 % — ABNORMAL LOW (ref 38.5–50.0)
Hemoglobin: 10.7 g/dL — ABNORMAL LOW (ref 13.2–17.1)
MCH: 32.6 pg (ref 27.0–33.0)
MCHC: 33.1 g/dL (ref 32.0–36.0)
MCV: 98.5 fL (ref 80.0–100.0)
MPV: 11.1 fL (ref 7.5–12.5)
Platelets: 180 10*3/uL (ref 140–400)
RBC: 3.28 10*6/uL — ABNORMAL LOW (ref 4.20–5.80)
RDW: 12.4 % (ref 11.0–15.0)
WBC: 7.5 10*3/uL (ref 3.8–10.8)

## 2021-12-20 LAB — C-REACTIVE PROTEIN: CRP: 0.7 mg/L (ref <8.0)

## 2021-12-26 ENCOUNTER — Other Ambulatory Visit (HOSPITAL_BASED_OUTPATIENT_CLINIC_OR_DEPARTMENT_OTHER): Payer: Self-pay

## 2022-01-03 ENCOUNTER — Other Ambulatory Visit: Payer: Self-pay | Admitting: Family Medicine

## 2022-01-04 NOTE — Telephone Encounter (Signed)
Requesting: tramadol '50mg'$   Contract: None UDS: None Last Visit: 09/28/21 Next Visit: 04/10/22 Last Refill: 08/25/21 #50 and 0RF  Please Advise

## 2022-01-04 NOTE — Telephone Encounter (Signed)
Pt requests it be sent to [facility] mail order pharmacy

## 2022-01-08 ENCOUNTER — Telehealth: Payer: Self-pay | Admitting: Family Medicine

## 2022-01-08 DIAGNOSIS — Z87442 Personal history of urinary calculi: Secondary | ICD-10-CM

## 2022-01-08 NOTE — Telephone Encounter (Signed)
Referral to [facility] entered.

## 2022-01-08 NOTE — Telephone Encounter (Signed)
Pt called requesting a referral sent over to [facility] to get an appointment.

## 2022-01-29 ENCOUNTER — Other Ambulatory Visit (HOSPITAL_BASED_OUTPATIENT_CLINIC_OR_DEPARTMENT_OTHER): Payer: Self-pay

## 2022-01-30 ENCOUNTER — Other Ambulatory Visit (HOSPITAL_BASED_OUTPATIENT_CLINIC_OR_DEPARTMENT_OTHER): Payer: Self-pay

## 2022-01-30 DIAGNOSIS — Z4789 Encounter for other orthopedic aftercare: Secondary | ICD-10-CM | POA: Diagnosis not present

## 2022-01-30 DIAGNOSIS — Z96611 Presence of right artificial shoulder joint: Secondary | ICD-10-CM | POA: Diagnosis not present

## 2022-01-30 MED ORDER — TRAMADOL HCL 50 MG PO TABS
ORAL_TABLET | ORAL | 0 refills | Status: DC
  Filled 2022-01-30: qty 30, 30d supply, fill #0

## 2022-02-27 ENCOUNTER — Other Ambulatory Visit: Payer: Self-pay | Admitting: Family Medicine

## 2022-02-27 DIAGNOSIS — E119 Type 2 diabetes mellitus without complications: Secondary | ICD-10-CM

## 2022-02-28 ENCOUNTER — Other Ambulatory Visit (HOSPITAL_BASED_OUTPATIENT_CLINIC_OR_DEPARTMENT_OTHER): Payer: Self-pay

## 2022-02-28 NOTE — Telephone Encounter (Signed)
Requesting: Ambien '10mg'$   Contract: None UDS: None Last Visit: 09/28/21 Next Visit: 04/10/22 Last Refill: 09/02/21 #90 and 1RF  Please Advise

## 2022-03-01 ENCOUNTER — Other Ambulatory Visit (HOSPITAL_BASED_OUTPATIENT_CLINIC_OR_DEPARTMENT_OTHER): Payer: Self-pay

## 2022-03-02 ENCOUNTER — Other Ambulatory Visit: Payer: Self-pay | Admitting: Family Medicine

## 2022-03-05 DIAGNOSIS — E1122 Type 2 diabetes mellitus with diabetic chronic kidney disease: Secondary | ICD-10-CM | POA: Diagnosis not present

## 2022-03-05 DIAGNOSIS — D631 Anemia in chronic kidney disease: Secondary | ICD-10-CM | POA: Diagnosis not present

## 2022-03-05 DIAGNOSIS — N1832 Chronic kidney disease, stage 3b: Secondary | ICD-10-CM | POA: Diagnosis not present

## 2022-03-05 DIAGNOSIS — N189 Chronic kidney disease, unspecified: Secondary | ICD-10-CM | POA: Diagnosis not present

## 2022-03-05 DIAGNOSIS — I129 Hypertensive chronic kidney disease with stage 1 through stage 4 chronic kidney disease, or unspecified chronic kidney disease: Secondary | ICD-10-CM | POA: Diagnosis not present

## 2022-03-07 DIAGNOSIS — D692 Other nonthrombocytopenic purpura: Secondary | ICD-10-CM | POA: Diagnosis not present

## 2022-03-07 DIAGNOSIS — C44319 Basal cell carcinoma of skin of other parts of face: Secondary | ICD-10-CM | POA: Diagnosis not present

## 2022-03-07 DIAGNOSIS — E663 Overweight: Secondary | ICD-10-CM | POA: Diagnosis not present

## 2022-03-07 DIAGNOSIS — Z85828 Personal history of other malignant neoplasm of skin: Secondary | ICD-10-CM | POA: Diagnosis not present

## 2022-03-08 ENCOUNTER — Other Ambulatory Visit: Payer: Self-pay | Admitting: Nephrology

## 2022-03-08 DIAGNOSIS — I129 Hypertensive chronic kidney disease with stage 1 through stage 4 chronic kidney disease, or unspecified chronic kidney disease: Secondary | ICD-10-CM

## 2022-03-08 DIAGNOSIS — N1832 Chronic kidney disease, stage 3b: Secondary | ICD-10-CM

## 2022-03-08 DIAGNOSIS — D631 Anemia in chronic kidney disease: Secondary | ICD-10-CM

## 2022-03-08 DIAGNOSIS — E1122 Type 2 diabetes mellitus with diabetic chronic kidney disease: Secondary | ICD-10-CM

## 2022-03-09 ENCOUNTER — Other Ambulatory Visit (HOSPITAL_BASED_OUTPATIENT_CLINIC_OR_DEPARTMENT_OTHER): Payer: Self-pay

## 2022-03-09 MED ORDER — IMIQUIMOD 5 % EX CREA
TOPICAL_CREAM | CUTANEOUS | 3 refills | Status: AC
  Filled 2022-03-09: qty 12, 30d supply, fill #0

## 2022-03-12 ENCOUNTER — Ambulatory Visit
Admission: RE | Admit: 2022-03-12 | Discharge: 2022-03-12 | Disposition: A | Discharge status: Home / Self Care | Payer: Medicare HMO | Source: Ambulatory Visit | Attending: Nephrology | Admitting: Nephrology

## 2022-03-12 DIAGNOSIS — N1832 Chronic kidney disease, stage 3b: Secondary | ICD-10-CM

## 2022-03-12 DIAGNOSIS — D631 Anemia in chronic kidney disease: Secondary | ICD-10-CM

## 2022-03-12 DIAGNOSIS — I129 Hypertensive chronic kidney disease with stage 1 through stage 4 chronic kidney disease, or unspecified chronic kidney disease: Secondary | ICD-10-CM

## 2022-03-12 DIAGNOSIS — N189 Chronic kidney disease, unspecified: Secondary | ICD-10-CM | POA: Diagnosis not present

## 2022-03-12 DIAGNOSIS — E1122 Type 2 diabetes mellitus with diabetic chronic kidney disease: Secondary | ICD-10-CM

## 2022-03-28 ENCOUNTER — Other Ambulatory Visit (HOSPITAL_BASED_OUTPATIENT_CLINIC_OR_DEPARTMENT_OTHER): Payer: Self-pay

## 2022-04-03 ENCOUNTER — Ambulatory Visit: Payer: Medicare HMO | Admitting: Family Medicine

## 2022-04-09 NOTE — Assessment & Plan Note (Signed)
Encouraged good sleep hygiene such as dark, quiet room. No blue/green glowing lights such as computer screens in bedroom. No alcohol or stimulants in evening. Cut down on caffeine as able. Regular exercise is helpful but not just prior to bed time.  Ambien prn 

## 2022-04-09 NOTE — Assessment & Plan Note (Signed)
Hydrate and monitor 

## 2022-04-09 NOTE — Assessment & Plan Note (Signed)
Encourage heart healthy diet such as MIND or DASH diet, increase exercise, avoid trans fats, simple carbohydrates and processed foods, consider a krill or fish or flaxseed oil cap daily.  Tolerating Rosuvastatin 

## 2022-04-09 NOTE — Assessment & Plan Note (Signed)
Encouraged moist heat and gentle stretching as tolerated. May try NSAIDs and prescription meds as directed and report if symptoms worsen or seek immediate care. Tramadol prn

## 2022-04-09 NOTE — Assessment & Plan Note (Signed)
Well controlled, no changes to meds. Encouraged heart healthy diet such as the DASH diet and exercise as tolerated.  °

## 2022-04-09 NOTE — Assessment & Plan Note (Signed)
hgba1c acceptable, minimize simple carbs. Increase exercise as tolerated. Continue current meds. Highest 125 and lowest

## 2022-04-10 ENCOUNTER — Ambulatory Visit (INDEPENDENT_AMBULATORY_CARE_PROVIDER_SITE_OTHER): Payer: Medicare HMO | Admitting: Family Medicine

## 2022-04-10 ENCOUNTER — Other Ambulatory Visit: Payer: Self-pay

## 2022-04-10 VITALS — BP 128/76 | HR 78 | Temp 98.0°F | Resp 16 | Ht 65.0 in | Wt 154.8 lb

## 2022-04-10 DIAGNOSIS — E119 Type 2 diabetes mellitus without complications: Secondary | ICD-10-CM | POA: Diagnosis not present

## 2022-04-10 DIAGNOSIS — Z79899 Other long term (current) drug therapy: Secondary | ICD-10-CM

## 2022-04-10 DIAGNOSIS — N289 Disorder of kidney and ureter, unspecified: Secondary | ICD-10-CM

## 2022-04-10 DIAGNOSIS — E7800 Pure hypercholesterolemia, unspecified: Secondary | ICD-10-CM

## 2022-04-10 DIAGNOSIS — Z23 Encounter for immunization: Secondary | ICD-10-CM | POA: Diagnosis not present

## 2022-04-10 DIAGNOSIS — C4491 Basal cell carcinoma of skin, unspecified: Secondary | ICD-10-CM

## 2022-04-10 DIAGNOSIS — M25511 Pain in right shoulder: Secondary | ICD-10-CM

## 2022-04-10 DIAGNOSIS — G4700 Insomnia, unspecified: Secondary | ICD-10-CM | POA: Diagnosis not present

## 2022-04-10 DIAGNOSIS — Z0000 Encounter for general adult medical examination without abnormal findings: Secondary | ICD-10-CM

## 2022-04-10 DIAGNOSIS — I10 Essential (primary) hypertension: Secondary | ICD-10-CM

## 2022-04-10 DIAGNOSIS — G8929 Other chronic pain: Secondary | ICD-10-CM

## 2022-04-10 LAB — CBC
HCT: 32.3 % — ABNORMAL LOW (ref 39.0–52.0)
Hemoglobin: 10.9 g/dL — ABNORMAL LOW (ref 13.0–17.0)
MCHC: 33.9 g/dL (ref 30.0–36.0)
MCV: 98.3 fl (ref 78.0–100.0)
Platelets: 156 10*3/uL (ref 150.0–400.0)
RBC: 3.28 Mil/uL — ABNORMAL LOW (ref 4.22–5.81)
RDW: 13.6 % (ref 11.5–15.5)
WBC: 7 10*3/uL (ref 4.0–10.5)

## 2022-04-10 LAB — LIPID PANEL
Cholesterol: 125 mg/dL (ref 0–200)
HDL: 41.6 mg/dL (ref >39.00)
LDL Cholesterol: 62 mg/dL (ref 0–99)
NonHDL: 83.03
Total CHOL/HDL Ratio: 3
Triglycerides: 104 mg/dL (ref 0.0–149.0)
VLDL: 20.8 mg/dL (ref 0.0–40.0)

## 2022-04-10 LAB — COMPREHENSIVE METABOLIC PANEL
ALT: 11 U/L (ref 0–53)
AST: 15 U/L (ref 0–37)
Albumin: 4.2 g/dL (ref 3.5–5.2)
Alkaline Phosphatase: 63 U/L (ref 39–117)
BUN: 33 mg/dL — ABNORMAL HIGH (ref 6–23)
CO2: 25 mEq/L (ref 19–32)
Calcium: 9.6 mg/dL (ref 8.4–10.5)
Chloride: 106 mEq/L (ref 96–112)
Creatinine, Ser: 1.83 mg/dL — ABNORMAL HIGH (ref 0.40–1.50)
GFR: 32.82 mL/min — ABNORMAL LOW (ref >60.00)
Glucose, Bld: 128 mg/dL — ABNORMAL HIGH (ref 70–99)
Potassium: 4.9 mEq/L (ref 3.5–5.1)
Sodium: 142 mEq/L (ref 135–145)
Total Bilirubin: 0.6 mg/dL (ref 0.2–1.2)
Total Protein: 6.6 g/dL (ref 6.0–8.3)

## 2022-04-10 LAB — HEMOGLOBIN A1C: Hgb A1c MFr Bld: 6.7 % — ABNORMAL HIGH (ref 4.6–6.5)

## 2022-04-10 LAB — TSH: TSH: 3.01 u[IU]/mL (ref 0.35–5.50)

## 2022-04-10 NOTE — Patient Instructions (Signed)
Preventive Care 65 Years and Older, Male Preventive care refers to lifestyle choices and visits with your health care provider that can promote health and wellness. Preventive care visits are also called wellness exams. What can I expect for my preventive care visit? Counseling During your preventive care visit, your health care provider may ask about your: Medical history, including: Past medical problems. Family medical history. History of falls. Current health, including: Emotional well-being. Home life and relationship well-being. Sexual activity. Memory and ability to understand (cognition). Lifestyle, including: Alcohol, nicotine or tobacco, and drug use. Access to firearms. Diet, exercise, and sleep habits. Work and work environment. Sunscreen use. Safety issues such as seatbelt and bike helmet use. Physical exam Your health care provider will check your: Height and weight. These may be used to calculate your BMI (body mass index). BMI is a measurement that tells if you are at a healthy weight. Waist circumference. This measures the distance around your waistline. This measurement also tells if you are at a healthy weight and may help predict your risk of certain diseases, such as type 2 diabetes and high blood pressure. Heart rate and blood pressure. Body temperature. Skin for abnormal spots. What immunizations do I need?  Vaccines are usually given at various ages, according to a schedule. Your health care provider will recommend vaccines for you based on your age, medical history, and lifestyle or other factors, such as travel or where you work. What tests do I need? Screening Your health care provider may recommend screening tests for certain conditions. This may include: Lipid and cholesterol levels. Diabetes screening. This is done by checking your blood sugar (glucose) after you have not eaten for a while (fasting). Hepatitis C test. Hepatitis B test. HIV (human  immunodeficiency virus) test. STI (sexually transmitted infection) testing, if you are at risk. Lung cancer screening. Colorectal cancer screening. Prostate cancer screening. Abdominal aortic aneurysm (AAA) screening. You may need this if you are a current or former smoker. Talk with your health care provider about your test results, treatment options, and if necessary, the need for more tests. Follow these instructions at home: Eating and drinking  Eat a diet that includes fresh fruits and vegetables, whole grains, lean protein, and low-fat dairy products. Limit your intake of foods with high amounts of sugar, saturated fats, and salt. Take vitamin and mineral supplements as recommended by your health care provider. Do not drink alcohol if your health care provider tells you not to drink. If you drink alcohol: Limit how much you have to 0-2 drinks a day. Know how much alcohol is in your drink. In the [place] one drink equals one 12 oz bottle of beer (355 mL), one 5 oz glass of wine (148 mL), or one 1 oz glass of hard liquor (44 mL). Lifestyle Brush your teeth every morning and night with fluoride toothpaste. Floss one time each day. Exercise for at least 30 minutes 5 or more days each week. Do not use any products that contain nicotine or tobacco. These products include cigarettes, chewing tobacco, and vaping devices, such as e-cigarettes. If you need help quitting, ask your health care provider. Do not use drugs. If you are sexually active, practice safe sex. Use a condom or other form of protection to prevent STIs. Take aspirin only as told by your health care provider. Make sure that you understand how much to take and what form to take. Work with your health care provider to find out whether it is safe   and beneficial for you to take aspirin daily. Ask your health care provider if you need to take a cholesterol-lowering medicine (statin). Find healthy ways to manage stress, such  as: Meditation, yoga, or listening to music. Journaling. Talking to a trusted person. Spending time with friends and family. Safety Always wear your seat belt while driving or riding in a vehicle. Do not drive: If you have been drinking alcohol. Do not ride with someone who has been drinking. When you are tired or distracted. While texting. If you have been using any mind-altering substances or drugs. Wear a helmet and other protective equipment during sports activities. If you have firearms in your house, make sure you follow all gun safety procedures. Minimize exposure to UV radiation to reduce your risk of skin cancer. What's next? Visit your health care provider once a year for an annual wellness visit. Ask your health care provider how often you should have your eyes and teeth checked. Stay up to date on all vaccines. This information is not intended to replace advice given to you by your health care provider. Make sure you discuss any questions you have with your health care provider. Document Revised: 11/23/2020 Document Reviewed: 11/23/2020 Elsevier Patient Education  2023 Elsevier Inc.  

## 2022-04-10 NOTE — Assessment & Plan Note (Signed)
Just had a lesion removed from right cheek by dermatology and is healing well

## 2022-04-10 NOTE — Progress Notes (Signed)
Subjective:   By signing my name below, I, Kellie Simmering, attest that this documentation has been prepared under the direction and in the presence of Mosie Lukes, MD., 04/10/2022.     Patient ID: Hayden Garza, male    DOB: 1932/06/28, 88 y.o.   MRN: [id number]  Chief Complaint  Patient presents with   Follow-up    Here for follow up   HPI Patient is in today for an office visit. Patient's wife is with him and also speaking on his behalf.  Blood Glucose: He states that his most recent highest blood glucose was 125 mg/dL and he has not seen a number below 70 mg/dL in the past month. He does not see an endocrinologist.   Dermatology: He recently saw his dermatologist, Dr. Tamala Julian, who biopsied the right side of his face and discovered basal cell carcinoma.  Family History: He reports no changes to his family history.  Immunizations: He has been informed about receiving COVID-19, RSV, and Shingles immunizations. He received the high-dose Flu immunization today.   Leg Weakness: He states that he experiences bilateral leg weakness and it is difficult for him to remain active.   Past Medical History:  Diagnosis Date   Allergic rhinitis    Anemia    BPH (benign prostatic hyperplasia)    With obstruction/lower urinary tract symptoms. Takes cardura for prostate.    CAD (coronary artery disease)    a. 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   Past Surgical History:  Procedure Laterality Date   APPENDECTOMY     BACK SURGERY     vertebral collapse in lower back had insturment, reports no metal   CARDIAC CATHETERIZATION N/A 05/02/2015   Procedure: Left Heart Cath and Coronary Angiography;  Surgeon: Lorretta Harp, MD;  Location: [facility];  Service: Cardiovascular;  Laterality: N/A;   CARDIAC CATHETERIZATION N/A 05/02/2015   Procedure: Coronary Stent Intervention;  Surgeon: Lorretta Harp, MD;  Location: [facility];  Service: Cardiovascular;  Laterality: N/A;   CORONARY ANGIOPLASTY WITH STENT PLACEMENT     JOINT REPLACEMENT Right    6 surgeries including total shoulder replacement roughly in [place] REVISION Right 04/20/2021   Procedure: Removal of infected right shoulder arthroplasty, implantation of cement spacer;  Surgeon: Justice Britain, MD;  Location: [facility];  Service: Orthopedics;  Laterality: Right;   Family History  Problem Relation Age of Onset   Heart disease Mother        chf   Heart attack Father    CAD Father    Colitis Sister    Other Brother        head trauma   Heart disease Brother    Hyperlipidemia Brother    Hypertension Brother    Diabetes Brother    Kidney failure Brother    Diabetes Son    Kidney failure Maternal Aunt    Social History   Socioeconomic History   Marital status: Married    Spouse name: Not on file   Number of  children: 1   Years of education: Not on file   Highest education level: Not on file  Occupational History   Occupation: Retired  Tobacco Use   Smoking status: Never   Smokeless tobacco: Never  Substance and Sexual Activity   Alcohol use: No   Drug use: No   Sexual activity: Not on file  Other Topics Concern   Not on file  Social History Narrative   Lives in [place], [place] with wife. No pets. No dietary restrictions      Retired from truck driving, long haul   No cigarettes, alcohol to excess   Seat belts every time.    Social Determinants of Health   Financial Resource Strain: Low Risk  (07/24/2021)   Overall Financial Resource Strain (CARDIA)    Difficulty of Paying Living Expenses: Not hard at all  Food Insecurity: No Food Insecurity  (07/24/2021)   Hunger Vital Sign    Worried About Running Out of Food in the Last Year: Never true    Ran Out of Food in the Last Year: Never true  Transportation Needs: No Transportation Needs (07/24/2021)   PRAPARE - Hydrologist (Medical): No    Lack of Transportation (Non-Medical): No  Physical Activity: Inactive (07/24/2021)   Exercise Vital Sign    Days of Exercise per Week: 0 days    Minutes of Exercise per Session: 0 min  Stress: No Stress Concern Present (07/24/2021)   [place]    Feeling of Stress : Not at all  Social Connections: Moderately Integrated (07/24/2021)   Social Connection and Isolation Panel [NHANES]    Frequency of Communication with Friends and Family: More than three times a week    Frequency of Social Gatherings with Friends and Family: More than three times a week    Attends Religious Services: More than 4 times per year    Active Member of [employer] or Organizations: No    Attends Archivist Meetings: Never    Marital Status: Married  Human resources officer Violence: Not At Risk (07/24/2021)   Humiliation, Afraid, Rape, and Kick questionnaire    Fear of Current or Ex-Partner: No    Emotionally Abused: No    Physically Abused: No    Sexually Abused: No   Outpatient Medications Prior to Visit  Medication Sig Dispense Refill   aspirin EC 81 MG tablet Take 4 tablets (325 mg total) by mouth daily.     chlorhexidine (PERIDEX) 0.12 % solution After brushing, rinse with one capful for one minute twice a day then spit out 473 mL 0   Cholecalciferol (VITAMIN D) 50 MCG (2000 UT) tablet Take 2,000 Units by mouth daily.     Coenzyme Q10 (COQ10) 100 MG CAPS Take 100 mg by mouth daily.     doxazosin (CARDURA) 8 MG tablet TAKE 1 TABLET AT BEDTIME 90 tablet 1   doxycycline (VIBRA-TABS) 100 MG tablet Take 1 tablet (100 mg total) by mouth 2 (two) times daily. 60 tablet 5   ferrous  gluconate (FERGON) 240 (27 FE) MG tablet Take 240 mg by mouth daily.     finasteride (PROSCAR) 5 MG tablet TAKE 1 TABLET EVERY DAY 90 tablet 1   glimepiride (AMARYL) 4 MG tablet TAKE 1 TABLET EVERY DAY BEFORE BREAKFAST 90 tablet 1   imiquimod (ALDARA) 5 % cream Apply topically every day as tolerated for 6 weeks. 12 each 3   metFORMIN (GLUCOPHAGE)  500 MG tablet Take 2 tablets (1,000 mg total) by mouth 2 (two) times daily with a meal. 360 tablet 1   metoprolol tartrate (LOPRESSOR) 25 MG tablet Take 0.5 tablets (12.5 mg total) by mouth 2 (two) times daily. 90 tablet 1   Multiple Vitamin (MULTIVITAMIN WITH MINERALS) TABS tablet Take 1 tablet by mouth daily.     omeprazole (PRILOSEC) 40 MG capsule Take 1 capsule (40 mg total) by mouth daily. 90 capsule 1   pioglitazone (ACTOS) 15 MG tablet TAKE 1 TABLET EVERY DAY 90 tablet 1   rosuvastatin (CRESTOR) 5 MG tablet Take 1 tablet (5 mg total) by mouth daily. 90 tablet 1   sodium polystyrene (KAYEXALATE) 15 GM/60ML suspension Take 60 milliliters (15 grams total) by mouth daily for 4 days. 240 mL 0   traMADol (ULTRAM) 50 MG tablet Take 1 tablet by mouth every 6 hours as needed for pain 50 tablet 0   traMADol (ULTRAM) 50 MG tablet TAKE 1 TABLET (50 MG TOTAL) BY MOUTH EVERY 12 (TWELVE) HOURS AS NEEDED (PAIN). 10 tablet 0   traMADol (ULTRAM) 50 MG tablet Take 1 tablet by mouth daily as needed for pain. 30 tablet 0   zolpidem (AMBIEN) 10 MG tablet TAKE 1 TABLET AT BEDTIME AS NEEDED FOR SLEEP 90 tablet 1   nitroGLYCERIN (NITROSTAT) 0.4 MG SL tablet PLACE 1 TABLET UNDER THE TONGUE EVERY 5 MINUTES AS NEEDED FOR CHESTPAIN 25 tablet 3   No facility-administered medications prior to visit.   Allergies  Allergen Reactions   Lipitor [Atorvastatin]     Joint and muscle pain   Penicillins     swelling in joints   ROS    Objective:    Physical Exam Constitutional:      General: He is not in acute distress.    Appearance: Normal appearance. He is not  ill-appearing.  HENT:     Head: Normocephalic and atraumatic.     Right Ear: Tympanic membrane, ear canal and external ear normal.     Left Ear: Tympanic membrane, ear canal and external ear normal.     Mouth/Throat:     Mouth: Mucous membranes are moist.     Pharynx: Oropharynx is clear.  Eyes:     Extraocular Movements: Extraocular movements intact.     Right eye: No nystagmus.     Left eye: No nystagmus.     Pupils: Pupils are equal, round, and reactive to light.  Neck:     Vascular: No carotid bruit.  Cardiovascular:     Rate and Rhythm: Normal rate and regular rhythm.     Pulses: Normal pulses.     Heart sounds: Normal heart sounds. No murmur heard.    No gallop.  Pulmonary:     Effort: Pulmonary effort is normal. No respiratory distress.     Breath sounds: Normal breath sounds. No wheezing or rales.  Abdominal:     General: Bowel sounds are normal.     Tenderness: There is no abdominal tenderness.  Musculoskeletal:     Comments: Muscle strength 5/5 on upper and lower extremities.   Lymphadenopathy:     Cervical: No cervical adenopathy.  Skin:    General: Skin is warm and dry.  Neurological:     Mental Status: He is alert and oriented to person, place, and time.     Sensory: Sensation is intact.     Motor: Motor function is intact.     Coordination: Coordination is intact.     Deep  Tendon Reflexes:     Reflex Scores:      Patellar reflexes are 2+ on the right side and 2+ on the left side. Psychiatric:        Mood and Affect: Mood normal.        Behavior: Behavior normal.        Judgment: Judgment normal.    BP 128/76 (BP Location: Right Arm, Patient Position: Sitting, Cuff Size: Normal)   Pulse 78   Temp 98 F (36.7 C) (Oral)   Resp 16   Ht _0  (1.651 m)   Wt 154 lb 12.8 oz (70.2 kg)   SpO2 97%   BMI 25.76 kg/m  Wt Readings from Last 3 Encounters:  04/10/22 154 lb 12.8 oz (70.2 kg)  12/19/21 154 lb (69.9 kg)  09/28/21 152 lb (68.9 kg)   Diabetic  Foot Exam - Simple   No data filed    Lab Results  Component Value Date   WBC 7.5 12/19/2021   HGB 10.7 (L) 12/19/2021   HCT 32.3 (L) 12/19/2021   PLT 180 12/19/2021   GLUCOSE 138 (H) 12/19/2021   CHOL 104 07/24/2021   TRIG 151.0 (H) 07/24/2021   HDL 43.00 07/24/2021   LDLCALC 31 07/24/2021   ALT 12 12/19/2021   AST 16 12/19/2021   NA 142 12/19/2021   K 4.9 12/19/2021   CL 110 12/19/2021   CREATININE 1.63 (H) 12/19/2021   BUN 25 12/19/2021   CO2 26 12/19/2021   TSH 3.76 07/24/2021   INR 1.13 05/01/2015   HGBA1C 6.8 (H) 10/05/2021   Lab Results  Component Value Date   TSH 3.76 07/24/2021   Lab Results  Component Value Date   WBC 7.5 12/19/2021   HGB 10.7 (L) 12/19/2021   HCT 32.3 (L) 12/19/2021   MCV 98.5 12/19/2021   PLT 180 12/19/2021   Lab Results  Component Value Date   NA 142 12/19/2021   K 4.9 12/19/2021   CO2 26 12/19/2021   GLUCOSE 138 (H) 12/19/2021   BUN 25 12/19/2021   CREATININE 1.63 (H) 12/19/2021   BILITOT 0.5 12/19/2021   ALKPHOS 51 10/05/2021   AST 16 12/19/2021   ALT 12 12/19/2021   PROT 6.3 12/19/2021   ALBUMIN 4.2 10/05/2021   CALCIUM 9.1 12/19/2021   ANIONGAP 10 04/20/2021   EGFR 41 (L) 12/19/2021   GFR 38.70 (L) 10/05/2021   Lab Results  Component Value Date   CHOL 104 07/24/2021   Lab Results  Component Value Date   HDL 43.00 07/24/2021   Lab Results  Component Value Date   LDLCALC 31 07/24/2021   Lab Results  Component Value Date   TRIG 151.0 (H) 07/24/2021   Lab Results  Component Value Date   CHOLHDL 2 07/24/2021   Lab Results  Component Value Date   HGBA1C 6.8 (H) 10/05/2021      Assessment & Plan:   Problem List Items Addressed This Visit     Diabetes mellitus type 2, noninsulin dependent ([place])    hgba1c acceptable, minimize simple carbs. Increase exercise as tolerated. Continue current meds. Highest 125 and lowest       Relevant Orders   Hemoglobin A1c   Microalbumin / creatinine urine ratio    Hyperlipidemia    Encourage heart healthy diet such as MIND or DASH diet, increase exercise, avoid trans fats, simple carbohydrates and processed foods, consider a krill or fish or flaxseed oil cap daily. Tolerating Rosuvastatin  Relevant Orders   Lipid panel   Essential hypertension    Well controlled, no changes to meds. Encouraged heart healthy diet such as the DASH diet and exercise as tolerated.       Relevant Orders   CBC   Comprehensive metabolic panel   TSH   Renal insufficiency    Hydrate and monitor      BCC (basal cell carcinoma of skin)    Just had a lesion removed from right cheek by dermatology and is healing well      Right shoulder pain    Encouraged moist heat and gentle stretching as tolerated. May try NSAIDs and prescription meds as directed and report if symptoms worsen or seek immediate care. Tramadol prn      Insomnia    Encouraged good sleep hygiene such as dark, quiet room. No blue/green glowing lights such as computer screens in bedroom. No alcohol or stimulants in evening. Cut down on caffeine as able. Regular exercise is helpful but not just prior to bed time. Ambien prn      Preventative health care           Other Visit Diagnoses     High risk medication use    -  Primary   Relevant Orders   Drug Monitoring Panel [phone] , Urine   Need for influenza vaccination       Relevant Orders   Flu Vaccine QUAD High Dose(Fluad) (Completed)      No orders of the defined types were placed in this encounter.  I, Penni Homans, MD, personally preformed the services described in this documentation.  All medical record entries made by the scribe were at my direction and in my presence.  I have reviewed the chart and discharge instructions (if applicable) and agree that the record reflects my personal performance and is accurate and complete. 04/10/2022  I,Mohammed Iqbal,acting as a scribe for Penni Homans, MD.,have documented all relevant  documentation on the behalf of Penni Homans, MD,as directed by  Penni Homans, MD while in the presence of Penni Homans, MD.  Penni Homans, MD

## 2022-04-11 ENCOUNTER — Other Ambulatory Visit (HOSPITAL_BASED_OUTPATIENT_CLINIC_OR_DEPARTMENT_OTHER): Payer: Self-pay

## 2022-04-11 ENCOUNTER — Other Ambulatory Visit (INDEPENDENT_AMBULATORY_CARE_PROVIDER_SITE_OTHER): Payer: Medicare HMO

## 2022-04-11 DIAGNOSIS — E119 Type 2 diabetes mellitus without complications: Secondary | ICD-10-CM | POA: Diagnosis not present

## 2022-04-11 DIAGNOSIS — Z79899 Other long term (current) drug therapy: Secondary | ICD-10-CM

## 2022-04-11 LAB — MICROALBUMIN / CREATININE URINE RATIO
Creatinine,U: 125.6 mg/dL
Microalb Creat Ratio: 1.1 mg/g (ref 0.0–30.0)
Microalb, Ur: 1.4 mg/dL (ref 0.0–1.9)

## 2022-04-11 MED ORDER — SHINGRIX 50 MCG/0.5ML IM SUSR
INTRAMUSCULAR | 1 refills | Status: DC
  Filled 2022-04-11: qty 1, 1d supply, fill #0

## 2022-04-13 LAB — DRUG MONITORING PANEL 376104, URINE
Amphetamines: NEGATIVE ng/mL (ref <500)
Barbiturates: NEGATIVE ng/mL (ref <300)
Benzodiazepines: NEGATIVE ng/mL (ref <100)
Cocaine Metabolite: NEGATIVE ng/mL (ref <150)
Desmethyltramadol: 1114 ng/mL — ABNORMAL HIGH (ref <100)
Opiates: NEGATIVE ng/mL (ref <100)
Oxycodone: NEGATIVE ng/mL (ref <100)
Tramadol: 6084 ng/mL — ABNORMAL HIGH (ref <100)

## 2022-04-13 LAB — DM TEMPLATE

## 2022-04-18 ENCOUNTER — Telehealth: Payer: Self-pay | Admitting: Family Medicine

## 2022-04-18 NOTE — Telephone Encounter (Signed)
Patient wife dropped of pt POA for our records  Placed in bin up front

## 2022-05-08 ENCOUNTER — Other Ambulatory Visit (HOSPITAL_BASED_OUTPATIENT_CLINIC_OR_DEPARTMENT_OTHER): Payer: Self-pay

## 2022-05-09 ENCOUNTER — Other Ambulatory Visit (HOSPITAL_BASED_OUTPATIENT_CLINIC_OR_DEPARTMENT_OTHER): Payer: Self-pay

## 2022-05-09 MED ORDER — AREXVY 120 MCG/0.5ML IM SUSR
INTRAMUSCULAR | 0 refills | Status: DC
  Filled 2022-05-09: qty 1, 1d supply, fill #0

## 2022-05-17 ENCOUNTER — Other Ambulatory Visit: Payer: Self-pay

## 2022-05-17 ENCOUNTER — Encounter: Payer: Self-pay | Admitting: Internal Medicine

## 2022-05-17 ENCOUNTER — Ambulatory Visit: Payer: Medicare HMO | Admitting: Internal Medicine

## 2022-05-17 VITALS — BP 115/58 | HR 77 | Temp 97.6°F | Ht 64.0 in | Wt 156.0 lb

## 2022-05-17 DIAGNOSIS — T8450XD Infection and inflammatory reaction due to unspecified internal joint prosthesis, subsequent encounter: Secondary | ICD-10-CM

## 2022-05-17 DIAGNOSIS — Z96611 Presence of right artificial shoulder joint: Secondary | ICD-10-CM | POA: Diagnosis not present

## 2022-05-17 NOTE — Patient Instructions (Signed)
Let's stop antibiotics today  Labs baseline and repeat in 3 months when you see me again   If increasing pain, redness/swelling, or fever, chill, please let me know sooner

## 2022-05-17 NOTE — Progress Notes (Signed)
[name]  Patient Active Problem List   Diagnosis Date Noted   Preventative health care 09/28/2021   Septic joint of right shoulder region [facility]) 04/20/2021   Insomnia 12/24/2020   Renal insufficiency 06/26/2020   BCC (basal cell carcinoma of skin) 06/26/2020   Right shoulder pain 06/26/2020   CAD (coronary artery disease) 06/15/2015   Essential hypertension 06/15/2015   Anemia 06/15/2015   NSTEMI (non-ST elevated myocardial infarction) ([place]) 04/30/2015   Dyspnea 05/22/2013   Hyperlipidemia 05/22/2013   Diabetes mellitus type 2, noninsulin dependent ([place]) 10/17/2009   ALLERGIC RHINITIS 10/17/2009   PULMONARY NODULE, RIGHT LOWER LOBE 10/17/2009      Subjective:    Patient ID: [name], male    DOB: [birth year], 88 y.o.   MRN: [id number]  Cc: f/u prosthetic shoulder infection  HPI:  [name] is a 88 y.o. male here for ongoing id care for prosthetic shoulder infection  I saw him last on 04/24/21 in the hospital At that time cx not growing yet so d/c'ed on empiric doxy/ceftriaxone He underwent hardware removal with placement abx spacer on 11/10 that ultimately grew p-acnes (2 out of 3 samples; 1 of 3 samles stain gpc/gpr but no growth)  05/02/21 I called lab. The tissue culture "normal flora" has p acnes growing with mixed flora (no strep group a/staph). The other 2 samples only grew p-acnes and was collected 1700. The one with mixed flora was collected 1730   Patient reported that his surgeon has no plan at this time to replace a new joint as his bone stock is minimal  No diarrhea, n/v He is actually constipated  He does report a little irritation at the end of his urination; no urgency. Has chronic frequency every few hours. No blood  12/06 id clinic f/u I had patient continuing on doxycycline only and stopped cefadroxil for gram negative coverage. The aspirate of his joint only grew p-acnes while the operative cx grew  both p-acnes and some other gram positive flora (only 1 sample) which I suspect is either lab contaminant No f/c Pain improving  06/21/21 id clinic f/u Moderate pain stable Nausea but able to eat Yellowish/red discoloration anterior of the right shoulder No f/c No diarrhea No other rash Last good bowel movement a week ago. Just a small one the morning of this visit No abdominal pain   08/08/21 id f/u Patient was given zofran to take as needed for nausea due to doxy. He takes zofran once day Had severe allergy to penicillin in his 51's and haven't taken any pcn analogue since then. Took cefadroxil in 04/2021 though and tolerated He is ok with keeping on doxy/zofran for now Shoulder pain right side stable 5-6/10 since after surgery No fever, chill No diarrhea, but constipation   09/26/21 id fu Patient very happy today. Despite having only abx spacer in his right shoulder, he has minimal pain (compared to what he had the past several years even with the replacement joint). No f/c No further nausea. Not taking zofran His surgeon is offering him to proceed with the second stage replacement joint, but patient is at this time not thinking to get one   12/19/21 id f/u He is doing well He wants to avoid replacing his shoulder joint and just keep with the abx spacer No f/c No n/v/diarrhea No sun burn   05/17/22 id f/u No complaint Stable pain No duskiness or rash around joint;  no swelling No n/v/diarrhea Wants to keep going with abx spacer and not doing any further surgery Want to go off abx and see   Allergies  Allergen Reactions   Lipitor [Atorvastatin]     Joint and muscle pain   Penicillins     swelling in joints      Outpatient Medications Prior to Visit  Medication Sig Dispense Refill   aspirin EC 81 MG tablet Take 4 tablets (325 mg total) by mouth daily. (Patient taking differently: Take 81 mg by mouth daily.)     chlorhexidine (PERIDEX) 0.12 % solution After  brushing, rinse with one capful for one minute twice a day then spit out 473 mL 0   Cholecalciferol (VITAMIN D) 50 MCG (2000 UT) tablet Take 2,000 Units by mouth daily.     Coenzyme Q10 (COQ10) 100 MG CAPS Take 100 mg by mouth daily.     doxazosin (CARDURA) 8 MG tablet TAKE 1 TABLET AT BEDTIME 90 tablet 1   ferrous gluconate (FERGON) 240 (27 FE) MG tablet Take 240 mg by mouth daily.     finasteride (PROSCAR) 5 MG tablet TAKE 1 TABLET EVERY DAY 90 tablet 1   glimepiride (AMARYL) 4 MG tablet TAKE 1 TABLET EVERY DAY BEFORE BREAKFAST 90 tablet 1   metFORMIN (GLUCOPHAGE) 500 MG tablet Take 2 tablets (1,000 mg total) by mouth 2 (two) times daily with a meal. 360 tablet 1   metoprolol tartrate (LOPRESSOR) 25 MG tablet Take 0.5 tablets (12.5 mg total) by mouth 2 (two) times daily. 90 tablet 1   Multiple Vitamin (MULTIVITAMIN WITH MINERALS) TABS tablet Take 1 tablet by mouth daily.     omeprazole (PRILOSEC) 40 MG capsule Take 1 capsule (40 mg total) by mouth daily. 90 capsule 1   rosuvastatin (CRESTOR) 5 MG tablet Take 1 tablet (5 mg total) by mouth daily. 90 tablet 1   traMADol (ULTRAM) 50 MG tablet TAKE 1 TABLET (50 MG TOTAL) BY MOUTH EVERY 12 (TWELVE) HOURS AS NEEDED (PAIN). 10 tablet 0   zolpidem (AMBIEN) 10 MG tablet TAKE 1 TABLET AT BEDTIME AS NEEDED FOR SLEEP 90 tablet 1   imiquimod (ALDARA) 5 % cream Apply topically every day as tolerated for 6 weeks. (Patient not taking: Reported on 05/17/2022) 12 each 3   nitroGLYCERIN (NITROSTAT) 0.4 MG SL tablet PLACE 1 TABLET UNDER THE TONGUE EVERY 5 MINUTES AS NEEDED FOR CHESTPAIN (Patient not taking: Reported on 05/17/2022) 25 tablet 3   pioglitazone (ACTOS) 15 MG tablet TAKE 1 TABLET EVERY DAY 90 tablet 1   RSV vaccine recomb adjuvanted (AREXVY) 120 MCG/0.5ML injection Inject into the muscle. (Patient not taking: Reported on 05/17/2022) 1 mL 0   sodium polystyrene (KAYEXALATE) 15 GM/60ML suspension Take 60 milliliters (15 grams total) by mouth daily for 4 days.  (Patient not taking: Reported on 05/17/2022) 240 mL 0   traMADol (ULTRAM) 50 MG tablet Take 1 tablet by mouth every 6 hours as needed for pain (Patient not taking: Reported on 05/17/2022) 50 tablet 0   traMADol (ULTRAM) 50 MG tablet Take 1 tablet by mouth daily as needed for pain. (Patient not taking: Reported on 05/17/2022) 30 tablet 0   Zoster Vaccine Adjuvanted [facility]) injection Inject into the muscle. (Patient not taking: Reported on 05/17/2022) 1 each 1   No facility-administered medications prior to visit.     Social History   Socioeconomic History   Marital status: Married    Spouse name: Not on file   Number of children: 1  Years of education: Not on file   Highest education level: Not on file  Occupational History   Occupation: Retired  Tobacco Use   Smoking status: Never   Smokeless tobacco: Never  Substance and Sexual Activity   Alcohol use: No   Drug use: No   Sexual activity: Not on file  Other Topics Concern   Not on file  Social History Narrative   Lives in [place], [place] with wife. No pets. No dietary restrictions      Retired from truck driving, long haul   No cigarettes, alcohol to excess   Seat belts every time.    Social Determinants of Health   Financial Resource Strain: Low Risk  (07/24/2021)   Overall Financial Resource Strain (CARDIA)    Difficulty of Paying Living Expenses: Not hard at all  Food Insecurity: No Food Insecurity (07/24/2021)   Hunger Vital Sign    Worried About Running Out of Food in the Last Year: Never true    Ran Out of Food in the Last Year: Never true  Transportation Needs: No Transportation Needs (07/24/2021)   PRAPARE - Hydrologist (Medical): No    Lack of Transportation (Non-Medical): No  Physical Activity: Inactive (07/24/2021)   Exercise Vital Sign    Days of Exercise per Week: 0 days    Minutes of Exercise per Session: 0 min  Stress: No Stress Concern Present (07/24/2021)   [place]    Feeling of Stress : Not at all  Social Connections: Moderately Integrated (07/24/2021)   Social Connection and Isolation Panel [NHANES]    Frequency of Communication with Friends and Family: More than three times a week    Frequency of Social Gatherings with Friends and Family: More than three times a week    Attends Religious Services: More than 4 times per year    Active Member of [employer] or Organizations: No    Attends Archivist Meetings: Never    Marital Status: Married  Human resources officer Violence: Not At Risk (07/24/2021)   Humiliation, Afraid, Rape, and Kick questionnaire    Fear of Current or Ex-Partner: No    Emotionally Abused: No    Physically Abused: No    Sexually Abused: No      Review of Systems All other ros negative  Objective:    BP (!) 115/58   Pulse 77   Temp 97.6 F (36.4 C) (Oral)   Ht '5\' 4"'$  (1.626 m)   Wt 156 lb (70.8 kg)   SpO2 97%   BMI 26.78 kg/m  Nursing note and vital signs reviewed.  Physical Exam General/constitutional: no distress, pleasant HEENT: Normocephalic, PER, Conj Clear, EOMI, Oropharynx clear Neck supple CV: rrr no mrg Lungs: clear to auscultation, normal respiratory effort Abd: Soft, Nontender Ext: no edema Skin: No Rash   MSK: stable exam -- right shoulder no swelling, discoloration, tenderness, or fluctuance or warmth. Active ROM internal/external is about 1/2 of normal range, and abduction is at 45-60 degree   Labs: Lab Results  Component Value Date   WBC 7.0 04/10/2022   HGB 10.9 (L) 04/10/2022   HCT 32.3 (L) 04/10/2022   MCV 98.3 04/10/2022   PLT 156.0 43/15/4008   Last metabolic panel Lab Results  Component Value Date   GLUCOSE 128 (H) 04/10/2022   NA 142 04/10/2022   K 4.9 04/10/2022   CL 106 04/10/2022   CO2 25 04/10/2022  BUN 33 (H) 04/10/2022   CREATININE 1.83 (H) 04/10/2022   GFRNONAA 46 (L) 04/24/2021   CALCIUM 9.6 04/10/2022    PROT 6.6 04/10/2022   ALBUMIN 4.2 04/10/2022   BILITOT 0.6 04/10/2022   ALKPHOS 63 04/10/2022   AST 15 04/10/2022   ALT 11 04/10/2022   ANIONGAP 10 04/20/2021    Micro:  Serology:  Imaging:  Assessment & Plan:   Problem List Items Addressed This Visit   None Visit Diagnoses     Infection of prosthetic joint, subsequent encounter    -  Primary   Relevant Orders   C-reactive protein   CBC w/Diff   COMPLETE METABOLIC PANEL WITH GFR     No orders of the defined types were placed in this encounter.  Abx: 11/22-c Doxycycline (brief cefadroxil)  11/14-22 ceftriaxone   88 year old male with complicated history of right shoulder interventions started in the 1990s,  underwent right shoulder reverse arthroplasty 8841 complicated by right should pain which was worked up in 2015 admitted for right shoulder PJI.  He had presented to orthopedic clinic for a 3 cm mass over the right shoulder enlarging over the past couple of days.  s/p right shoulder PJI SP hardware removal and cement placement on 04/20/21     11/09 cx p-acnes but one plate with gpc/gpr. Suspect contaminant gpc/gpr (not worked up but no staph/strep). Aspirate cx only p-acnes  His surgeon did mention there was some retained cement that couldn't be removed and we had agreed on continue indefinite antibiotics or at least 6-12 months. At this time there is no plan for a new shoulder surgery as there is no bone stock for that   Has hx severe pcn allergy. Tolerated cefadroxil though  09/26/21 assessment His surgeon is comfortable discussing 2nd stage but patient at this time doesn't want a new joint as he is almost pain free and has the range of motion to do his normal activities. He is also tolerating doxycycline very well without further nausea  12/19/21 assessment Doing well on doxy Plan to go 12 months until end of 04/2022 with abx and stop He still doesn't want a new joint  He is to discuss the above 2 issues with  his surgeon especially trial off abx on 04/2022  Follow up with me 05/2022 Continue doxy for now   05/17/22 id assessment Doing very well. Want to stop abx and see Baseline 5/10 pain with the abx spacer in place. Still do not want to do a revision 2nd stage arthroplasty Exam without swelling/tenderness/redness/warm; limited active rom but relatively full  Labs today Due to p-acnes slow indolent nature, will repeat labs/visit in 3 months (or sooner if s/s if infection relapse)  Follow-up: No follow-ups on file.      Jabier Mutton, [place] [place] [phone]  pager   [phone] cell 05/17/2022, 9:18 AM

## 2022-05-18 LAB — COMPLETE METABOLIC PANEL WITH GFR
AG Ratio: 1.7 (calc) (ref 1.0–2.5)
ALT: 6 U/L — ABNORMAL LOW (ref 9–46)
AST: 11 U/L (ref 10–35)
Albumin: 4.1 g/dL (ref 3.6–5.1)
Alkaline phosphatase (APISO): 47 U/L (ref 35–144)
BUN/Creatinine Ratio: 15 (calc) (ref 6–22)
BUN: 26 mg/dL — ABNORMAL HIGH (ref 7–25)
CO2: 24 mmol/L (ref 20–32)
Calcium: 9.5 mg/dL (ref 8.6–10.3)
Chloride: 110 mmol/L (ref 98–110)
Creat: 1.76 mg/dL — ABNORMAL HIGH (ref 0.70–1.22)
Globulin: 2.4 g/dL (calc) (ref 1.9–3.7)
Glucose, Bld: 195 mg/dL — ABNORMAL HIGH (ref 65–99)
Potassium: 4.8 mmol/L (ref 3.5–5.3)
Sodium: 142 mmol/L (ref 135–146)
Total Bilirubin: 0.3 mg/dL (ref 0.2–1.2)
Total Protein: 6.5 g/dL (ref 6.1–8.1)
eGFR: 37 mL/min/{1.73_m2} — ABNORMAL LOW (ref >=60)

## 2022-05-18 LAB — CBC WITH DIFFERENTIAL/PLATELET
Absolute Monocytes: 689 cells/uL (ref 200–950)
Basophils Absolute: 84 cells/uL (ref 0–200)
Basophils Relative: 1 %
Eosinophils Absolute: 328 cells/uL (ref 15–500)
Eosinophils Relative: 3.9 %
HCT: 31.3 % — ABNORMAL LOW (ref 38.5–50.0)
Hemoglobin: 10.7 g/dL — ABNORMAL LOW (ref 13.2–17.1)
Lymphs Abs: 2293 cells/uL (ref 850–3900)
MCH: 33.3 pg — ABNORMAL HIGH (ref 27.0–33.0)
MCHC: 34.2 g/dL (ref 32.0–36.0)
MCV: 97.5 fL (ref 80.0–100.0)
MPV: 11 fL (ref 7.5–12.5)
Monocytes Relative: 8.2 %
Neutro Abs: 5006 cells/uL (ref 1500–7800)
Neutrophils Relative %: 59.6 %
Platelets: 175 10*3/uL (ref 140–400)
RBC: 3.21 10*6/uL — ABNORMAL LOW (ref 4.20–5.80)
RDW: 12.5 % (ref 11.0–15.0)
Total Lymphocyte: 27.3 %
WBC: 8.4 10*3/uL (ref 3.8–10.8)

## 2022-05-18 LAB — C-REACTIVE PROTEIN: CRP: 0.5 mg/L (ref <8.0)

## 2022-05-30 DIAGNOSIS — Z0100 Encounter for examination of eyes and vision without abnormal findings: Secondary | ICD-10-CM | POA: Diagnosis not present

## 2022-05-30 DIAGNOSIS — E119 Type 2 diabetes mellitus without complications: Secondary | ICD-10-CM | POA: Diagnosis not present

## 2022-07-11 ENCOUNTER — Other Ambulatory Visit: Payer: Self-pay | Admitting: Family Medicine

## 2022-07-12 NOTE — Telephone Encounter (Signed)
Requesting: tramadol '50mg'$   Contract: 04/10/22 UDS: 04/11/22 Last Visit: 04/10/22 Next Visit: 10/15/22 Last Refill: 01/04/22 #10 and 0RF   Please Advise

## 2022-07-18 DIAGNOSIS — H524 Presbyopia: Secondary | ICD-10-CM | POA: Diagnosis not present

## 2022-07-18 DIAGNOSIS — H35363 Drusen (degenerative) of macula, bilateral: Secondary | ICD-10-CM | POA: Diagnosis not present

## 2022-07-18 DIAGNOSIS — Z961 Presence of intraocular lens: Secondary | ICD-10-CM | POA: Diagnosis not present

## 2022-07-18 DIAGNOSIS — E119 Type 2 diabetes mellitus without complications: Secondary | ICD-10-CM | POA: Diagnosis not present

## 2022-07-18 DIAGNOSIS — H40013 Open angle with borderline findings, low risk, bilateral: Secondary | ICD-10-CM | POA: Diagnosis not present

## 2022-07-18 DIAGNOSIS — H18513 Endothelial corneal dystrophy, bilateral: Secondary | ICD-10-CM | POA: Diagnosis not present

## 2022-07-18 DIAGNOSIS — H40023 Open angle with borderline findings, high risk, bilateral: Secondary | ICD-10-CM | POA: Diagnosis not present

## 2022-07-18 DIAGNOSIS — D3132 Benign neoplasm of left choroid: Secondary | ICD-10-CM | POA: Diagnosis not present

## 2022-07-25 ENCOUNTER — Ambulatory Visit (INDEPENDENT_AMBULATORY_CARE_PROVIDER_SITE_OTHER): Payer: Medicare HMO | Admitting: *Deleted

## 2022-07-25 DIAGNOSIS — Z0000 Encounter for general adult medical examination without abnormal findings: Secondary | ICD-10-CM | POA: Diagnosis not present

## 2022-07-25 NOTE — Progress Notes (Signed)
Subjective:   Hayden Garza is a 89 y.o. male who presents for Medicare Annual/Subsequent preventive examination.  I connected with  Hayden Garza on 07/25/22 by a audio enabled telemedicine application and verified that I am speaking with the correct person using two identifiers.  Patient Location: Home  Provider Location: Office/Clinic  I discussed the limitations of evaluation and management by telemedicine. The patient expressed understanding and agreed to proceed.   Review of Systems    Defer to PCP Cardiac Risk Factors include: advanced age (>66mn, >>65women);male gender;dyslipidemia;diabetes mellitus;hypertension     Objective:    There were no vitals filed for this visit. There is no height or weight on file to calculate BMI.     07/25/2022    8:53 AM 07/24/2021   11:45 AM 04/20/2021   11:00 PM 04/30/2015   11:25 AM 11/30/2011   12:53 PM  Advanced Directives  Does Patient Have a Medical Advance Directive? Yes Yes Yes No Patient has advance directive, copy not in chart  Type of Advance [place]ive [place] will  Hayden Garza Does patient want to make changes to medical advance directive? No - Patient declined  No - Patient declined    Copy of Hayden Garza Chart? Yes - validated most recent copy scanned in chart (See row information) No - copy requested No - copy requested    Would patient like information on creating a medical advance directive?    No - patient declined information   Pre-existing out of facility DNR order (yellow form or pink MOST form)     No    Current Medications (verified) Outpatient Encounter Medications as of 07/25/2022  Medication Sig   aspirin EC 81 MG tablet Take 4 tablets (325 mg total) by mouth daily. (Patient taking differently: Take 81 mg by mouth daily.)   chlorhexidine (PERIDEX) 0.12 % solution  After brushing, rinse with one capful for one minute twice a day then spit out   Cholecalciferol (VITAMIN D) 50 MCG (2000 UT) tablet Take 2,000 Units by mouth daily.   Coenzyme Q10 (COQ10) 100 MG CAPS Take 100 mg by mouth daily.   doxazosin (CARDURA) 8 MG tablet TAKE 1 TABLET AT BEDTIME   ferrous gluconate (FERGON) 240 (27 FE) MG tablet Take 240 mg by mouth daily.   finasteride (PROSCAR) 5 MG tablet TAKE 1 TABLET EVERY DAY   glimepiride (AMARYL) 4 MG tablet TAKE 1 TABLET EVERY DAY BEFORE BREAKFAST   imiquimod (ALDARA) 5 % cream Apply topically every day as tolerated for 6 weeks. (Patient not taking: Reported on 05/17/2022)   metFORMIN (GLUCOPHAGE) 500 MG tablet Take 2 tablets (1,000 mg total) by mouth 2 (two) times daily with a meal.   metoprolol tartrate (LOPRESSOR) 25 MG tablet Take 0.5 tablets (12.5 mg total) by mouth 2 (two) times daily.   Multiple Vitamin (MULTIVITAMIN WITH MINERALS) TABS tablet Take 1 tablet by mouth daily.   nitroGLYCERIN (NITROSTAT) 0.4 MG SL tablet PLACE 1 TABLET UNDER THE TONGUE EVERY 5 MINUTES AS NEEDED FOR CHESTPAIN (Patient not taking: Reported on 05/17/2022)   omeprazole (PRILOSEC) 40 MG capsule Take 1 capsule (40 mg total) by mouth daily.   pioglitazone (ACTOS) 15 MG tablet TAKE 1 TABLET EVERY DAY   rosuvastatin (CRESTOR) 5 MG tablet Take 1 tablet (5 mg total) by mouth daily.   sodium polystyrene (KAYEXALATE) 15 GM/60ML suspension Take 60  milliliters (15 grams total) by mouth daily for 4 days. (Patient not taking: Reported on 05/17/2022)   traMADol (ULTRAM) 50 MG tablet TAKE 1 TABLET EVERY 12 HOURS AS NEEDED FOR PAIN   zolpidem (AMBIEN) 10 MG tablet TAKE 1 TABLET AT BEDTIME AS NEEDED FOR SLEEP   No facility-administered encounter medications on file as of 07/25/2022.    Allergies (verified) Lipitor [atorvastatin] and Penicillins   History: Past Medical History:  Diagnosis Date   Allergic rhinitis    Anemia    BPH (benign prostatic hyperplasia)    With  obstruction/lower urinary tract symptoms. Takes cardura for prostate.    CAD (coronary artery disease)    a. 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   Past Surgical History:  Procedure Laterality Date   APPENDECTOMY     BACK SURGERY     vertebral collapse in lower back had insturment, reports no metal   CARDIAC CATHETERIZATION N/A 05/02/2015   Procedure: Left Heart Cath and Coronary Angiography;  Surgeon: Lorretta Harp, MD;  Location: [facility];  Service: Cardiovascular;  Laterality: N/A;   CARDIAC CATHETERIZATION N/A 05/02/2015   Procedure: Coronary Stent Intervention;  Surgeon: Lorretta Harp, MD;  Location: [facility];  Service: Cardiovascular;  Laterality: N/A;   CORONARY ANGIOPLASTY WITH STENT PLACEMENT     JOINT REPLACEMENT Right    6 surgeries including total shoulder replacement roughly in [place] REVISION Right 04/20/2021   Procedure: Removal of infected right shoulder arthroplasty, implantation of cement spacer;  Surgeon: Justice Britain, MD;  Location: [facility];  Service: Orthopedics;  Laterality: Right;   Family History  Problem Relation Age of Onset   Heart disease Mother        chf   Heart attack Father    CAD Father    Colitis Sister    Other Brother        head trauma   Heart disease Brother    Hyperlipidemia Brother    Hypertension Brother    Diabetes Brother    Kidney failure Brother    Diabetes Son    Kidney failure Maternal Aunt    Social History   Socioeconomic History   Marital status: Married    Spouse name: Not on file   Number of children: 1   Years of education: Not on file   Highest education  level: Not on file  Occupational History   Occupation: Retired  Tobacco Use   Smoking status: Never   Smokeless tobacco: Never  Substance and Sexual Activity   Alcohol use: No   Drug use: No   Sexual activity: Not on file  Other Topics Concern   Not on file  Social History Narrative   Lives in [place], [place] with wife. No pets. No dietary restrictions      Retired from truck driving, long haul   No cigarettes, alcohol to excess   Seat belts every time.    Social Determinants of Health   Financial Resource Strain: Low Risk  (07/24/2021)   Overall Financial  Resource Strain (CARDIA)    Difficulty of Paying Living Expenses: Not hard at all  Food Insecurity: No Food Insecurity (07/25/2022)   Hunger Vital Sign    Worried About Running Out of Food in the Last Year: Never true    Ran Out of Food in the Last Year: Never true  Transportation Needs: No Transportation Needs (07/25/2022)   PRAPARE - Hydrologist (Medical): No    Lack of Transportation (Non-Medical): No  Physical Activity: Inactive (07/24/2021)   Exercise Vital Sign    Days of Exercise per Week: 0 days    Minutes of Exercise per Session: 0 min  Stress: No Stress Concern Present (07/24/2021)   [place]    Feeling of Stress : Not at all  Social Connections: Moderately Integrated (07/24/2021)   Social Connection and Isolation Panel [NHANES]    Frequency of Communication with Friends and Family: More than three times a week    Frequency of Social Gatherings with Friends and Family: More than three times a week    Attends Religious Services: More than 4 times per year    Active Member of [employer] or Organizations: No    Attends Music therapist: Never    Marital Status: Married    Tobacco Counseling Counseling given: Not Answered   Clinical Intake:  Pre-visit preparation completed: Yes  Pain : No/denies  pain  Nutritional Risks: None Diabetes: Yes CBG done?: No Did pt. bring in CBG monitor from home?: No (audio visit)  How often do you need to have someone help you when you read instructions, pamphlets, or other written materials from your doctor or pharmacy?: 1 - Never   Activities of Daily Living    07/25/2022    9:03 AM  In your present state of health, do you have any difficulty performing the following activities:  Hearing? 0  Vision? 0  Difficulty concentrating or making decisions? 0  Walking or climbing stairs? 0  Dressing or bathing? 0  Doing errands, shopping? 0  Preparing Food and eating ? N  Comment wife cooks meals  Using the Toilet? N  In the past six months, have you accidently leaked urine? N  Do you have problems with loss of bowel control? N  Managing your Medications? N  Managing your Finances? N  Housekeeping or managing your Housekeeping? N    Patient Care Team: Mosie Lukes, MD as PCP - General (Family Medicine)  Indicate any recent Medical Services you may have received from other than [facility] providers in the past year (date may be approximate).     Assessment:   This is a routine wellness examination for [place].  Hearing/Vision screen No results found.  Dietary issues and exercise activities discussed: Current Exercise Habits: Home exercise routine, Type of exercise: walking, Time (Minutes): 20, Frequency (Times/Week): 3, Weekly Exercise (Minutes/Week): 60, Intensity: Mild, Exercise limited by: None identified   Goals Addressed             This Visit's Progress    Patient Stated   On track    Maintain health & independence       Depression Screen    07/25/2022    9:03 AM 05/17/2022    9:04 AM 04/10/2022    9:24 AM 09/28/2021    8:59 AM 09/26/2021    8:49 AM 07/24/2021   11:48 AM 05/16/2021   11:09 AM  PHQ 2/9 Scores  PHQ -  2 Score 0 0 0 0 0 0 1  PHQ- 9 Score   0        Fall Risk    07/25/2022    9:01 AM 05/17/2022    9:03 AM  04/10/2022    9:24 AM 09/28/2021    8:58 AM 09/26/2021    8:49 AM  Fall Risk   Falls in the past year? 0 0 0 0 0  Number falls in past yr: 0  0 0 0  Injury with Fall? 0  0 0 0  Risk for fall due to : No Fall Risks No Fall Risks  No Fall Risks   Follow up Falls evaluation completed Falls evaluation completed Falls evaluation completed Falls evaluation completed     [place]:  Any stairs in or around the home? Yes  If so, are there any without handrails? No  Home free of loose throw rugs in walkways, pet beds, electrical cords, etc? Yes  Adequate lighting in your home to reduce risk of falls? Yes   ASSISTIVE DEVICES UTILIZED TO PREVENT FALLS:  Life alert? No  Use of a cane, walker or w/c? No  Grab bars in the bathroom? No  Shower chair or bench in shower? No  Elevated toilet seat or a handicapped toilet? No   TIMED UP AND GO:  Was the test performed?  No, audio visit .    Cognitive Function:        07/25/2022    9:09 AM  6CIT Screen  What Year? 0 points  What month? 0 points  What time? 0 points  Count back from 20 0 points  Months in reverse 2 points  Repeat phrase 8 points  Total Score 10 points    Immunizations Immunization History  Administered Date(s) Administered   Fluad Quad(high Dose 65+) 03/29/2021, 04/10/2022   H1N1 06/08/2008   Influenza Split 03/08/2008, 03/02/2009, 02/22/2010, 03/01/2011   Influenza, High Dose Seasonal PF 03/28/2012, 04/02/2013, 03/05/2014, 03/08/2015, 02/27/2016, 03/13/2017, 03/07/2018, 02/19/2019, 03/16/2020   PFIZER(Purple Top)SARS-COV-2 Vaccination 07/01/2019, 07/22/2019, 03/16/2020   Pneumococcal Conjugate-13 03/05/2014   Pneumococcal Polysaccharide-23 04/21/2001, 11/09/2016   Respiratory Syncytial Virus Vaccine,Recomb Aduvanted(Arexvy) 05/09/2022   Td 01/26/2005   Tdap 12/22/2020   Zoster Recombinat (Shingrix) 04/11/2022   Zoster, Live 03/26/2007    TDAP status: Up to date  Flu Vaccine  status: Up to date  Pneumococcal vaccine status: Up to date  Covid-19 vaccine status: Information provided on how to obtain vaccines.   Qualifies for Shingles Vaccine? Yes   Zostavax completed Yes   Shingrix Completed?: No.    Education has been provided regarding the importance of this vaccine. Patient has been advised to call insurance company to determine out of pocket expense if they have not yet received this vaccine. Advised may also receive vaccine at local pharmacy or Health Dept. Verbalized acceptance and understanding.  Screening Tests Health Maintenance  Topic Date Due   FOOT EXAM  Never done   OPHTHALMOLOGY EXAM  Never done   COVID-19 Vaccine (4 - 2023-24 season) 02/09/2022   Zoster Vaccines- Shingrix (2 of 2) 06/06/2022   Medicare Annual Wellness (AWV)  07/24/2022   HEMOGLOBIN A1C  10/09/2022   DTaP/Tdap/Td (3 - Td or Tdap) 12/23/2030   Pneumonia Vaccine 51+ Years old  Completed   INFLUENZA VACCINE  Completed   HPV VACCINES  Aged Out    Health Maintenance  Health Maintenance Due  Topic Date Due   FOOT EXAM  Never done   OPHTHALMOLOGY EXAM  Never done   COVID-19 Vaccine (4 - 2023-24 season) 02/09/2022   Zoster Vaccines- Shingrix (2 of 2) 06/06/2022   Medicare Annual Wellness (AWV)  07/24/2022    Colorectal cancer screening: No longer required.   Lung Cancer Screening: (Low Dose CT Chest recommended if Age 35-80 years, 30 pack-year currently smoking OR have quit w/in 15years.) does not qualify.   Additional Screening:  Hepatitis C Screening: does not qualify  Vision Screening: Recommended annual ophthalmology exams for early detection of glaucoma and other disorders of the eye. Is the patient up to date with their annual eye exam?  Yes  Who is the provider or what is the name of the office in which the patient attends annual eye exams? [facility] in [place] If pt is not established with a provider, would they like to be referred to a provider to  establish care? No .   Dental Screening: Recommended annual dental exams for proper oral hygiene  Community Resource Referral / Chronic Care Management: CRR required this visit?  No   CCM required this visit?  No      Plan:     I have personally reviewed and noted the following in the patient's chart:   Medical and social history Use of alcohol, tobacco or illicit drugs  Current medications and supplements including opioid prescriptions. Patient is currently taking opioid prescriptions. Information provided to patient regarding non-opioid alternatives. Patient advised to discuss non-opioid treatment plan with their provider. Functional ability and status Nutritional status Physical activity Advanced directives List of other physicians Hospitalizations, surgeries, and ER visits in previous 12 months Vitals Screenings to include cognitive, depression, and falls Referrals and appointments  In addition, I have reviewed and discussed with patient certain preventive protocols, quality metrics, and best practice recommendations. A written personalized care plan for preventive services as well as general preventive health recommendations were provided to patient.   Due to this being a telephonic visit, the after visit summary with patients personalized plan was offered to patient via mail or my-chart. Patient declined at this time.   Beatris Ship, [place]   07/25/2022   Nurse Notes: None

## 2022-07-25 NOTE — Patient Instructions (Signed)
Hayden Garza , Thank you for taking time to come for your Medicare Wellness Visit. I appreciate your ongoing commitment to your health goals. Please review the following plan we discussed and let me know if I can assist you in the future.   These are the goals we discussed:  Goals      Hayden Garza        This is a list of the screening recommended for you and due dates:  Health Maintenance  Topic Date Due   Complete foot exam   Never done   Eye exam for diabetics  Never done   COVID-19 Vaccine (4 - 2023-24 season) 02/09/2022   Zoster (Shingles) Vaccine (2 of 2) 06/06/2022   Hemoglobin A1C  10/09/2022   Medicare Annual Wellness Visit  07/26/2023   DTaP/Tdap/Td vaccine (3 - Td or Tdap) 12/23/2030   Pneumonia Vaccine  Completed   Flu Shot  Completed   HPV Vaccine  Aged Out     Next appointment: Follow up in one year for your annual wellness visit.   Preventive Care 57 Years and Older, Male Preventive care refers to lifestyle choices and visits with your health care provider that can promote health and wellness. What does preventive care include? A yearly physical exam. This is also called an annual well check. Dental exams once or twice a year. Routine eye exams. Ask your health care provider how often you should have your eyes checked. Personal lifestyle choices, including: Daily care of your teeth and gums. Regular physical activity. Eating a healthy diet. Avoiding tobacco and drug use. Limiting alcohol use. Practicing safe sex. Taking low doses of aspirin every day. Taking vitamin and mineral supplements as recommended by your health care provider. What happens during an annual well check? The services and screenings done by your health care provider during your annual well check will depend on your age, overall health, lifestyle risk factors, and family history of disease. Counseling  Your health care provider may ask you questions  about your: Alcohol use. Tobacco use. Drug use. Emotional well-being. Home and relationship well-being. Sexual activity. Eating habits. History of falls. Memory and ability to understand (cognition). Work and work Statistician. Screening  You may have the following tests or measurements: Height, weight, and BMI. Blood pressure. Lipid and cholesterol levels. These may be checked every 5 years, or more frequently if you are over 19 years old. Skin check. Lung cancer screening. You may have this screening every year starting at age 61 if you have a 30-pack-year history of smoking and currently smoke or have quit within the past 15 years. Fecal occult blood test (FOBT) of the stool. You may have this test every year starting at age 70. Flexible sigmoidoscopy or colonoscopy. You may have a sigmoidoscopy every 5 years or a colonoscopy every 10 years starting at age 20. Prostate cancer screening. Recommendations will vary depending on your family history and other risks. Hepatitis C blood test. Hepatitis B blood test. Sexually transmitted disease (STD) testing. Diabetes screening. This is done by checking your blood sugar (glucose) after you have not eaten for a while (fasting). You may have this done every 1-3 years. Abdominal aortic aneurysm (AAA) screening. You may need this if you are a current or former smoker. Osteoporosis. You may be screened starting at age 36 if you are at high risk. Talk with your health care provider about your test results, treatment options, and if necessary,  the need for more tests. Vaccines  Your health care provider may recommend certain vaccines, such as: Influenza vaccine. This is recommended every year. Tetanus, diphtheria, and acellular pertussis (Tdap, Td) vaccine. You may need a Td booster every 10 years. Zoster vaccine. You may need this after age 67. Pneumococcal 13-valent conjugate (PCV13) vaccine. One dose is recommended after age 45. Pneumococcal  polysaccharide (PPSV23) vaccine. One dose is recommended after age 42. Talk to your health care provider about which screenings and vaccines you need and how often you need them. This information is not intended to replace advice given to you by your health care provider. Make sure you discuss any questions you have with your health care provider. Document Released: 06/24/2015 Document Revised: 02/15/2016 Document Reviewed: 03/29/2015 Elsevier Interactive [employer]  2017 [place] Prevention in the Home Falls can cause injuries. They can happen to people of all ages. There are many things you can do to make your home safe and to help prevent falls. What can I do on the outside of my home? Regularly fix the edges of walkways and driveways and fix any cracks. Remove anything that might make you trip as you walk through a door, such as a raised step or threshold. Trim any bushes or trees on the path to your home. Use bright outdoor lighting. Clear any walking paths of anything that might make someone trip, such as rocks or tools. Regularly check to see if handrails are loose or broken. Make sure that both sides of any steps have handrails. Any raised decks and porches should have guardrails on the edges. Have any leaves, snow, or ice cleared regularly. Use sand or salt on walking paths during winter. Clean up any spills in your garage right away. This includes oil or grease spills. What can I do in the bathroom? Use night lights. Install grab bars by the toilet and in the tub and shower. Do not use towel bars as grab bars. Use non-skid mats or decals in the tub or shower. If you need to sit down in the shower, use a plastic, non-slip stool. Keep the floor dry. Clean up any water that spills on the floor as soon as it happens. Remove soap buildup in the tub or shower regularly. Attach bath mats securely with double-sided non-slip rug tape. Do not have throw rugs and other  things on the floor that can make you trip. What can I do in the bedroom? Use night lights. Make sure that you have a light by your bed that is easy to reach. Do not use any sheets or blankets that are too big for your bed. They should not hang down onto the floor. Have a firm chair that has side arms. You can use this for support while you get dressed. Do not have throw rugs and other things on the floor that can make you trip. What can I do in the kitchen? Clean up any spills right away. Avoid walking on wet floors. Keep items that you use a lot in easy-to-reach places. If you need to reach something above you, use a strong step stool that has a grab bar. Keep electrical cords out of the way. Do not use floor polish or wax that makes floors slippery. If you must use wax, use non-skid floor wax. Do not have throw rugs and other things on the floor that can make you trip. What can I do with my stairs? Do not leave any items on  the stairs. Make sure that there are handrails on both sides of the stairs and use them. Fix handrails that are broken or loose. Make sure that handrails are as long as the stairways. Check any carpeting to make sure that it is firmly attached to the stairs. Fix any carpet that is loose or worn. Avoid having throw rugs at the top or bottom of the stairs. If you do have throw rugs, attach them to the floor with carpet tape. Make sure that you have a light switch at the top of the stairs and the bottom of the stairs. If you do not have them, ask someone to add them for you. What else can I do to help prevent falls? Wear shoes that: Do not have high heels. Have rubber bottoms. Are comfortable and fit you well. Are closed at the toe. Do not wear sandals. If you use a stepladder: Make sure that it is fully opened. Do not climb a closed stepladder. Make sure that both sides of the stepladder are locked into place. Ask someone to hold it for you, if possible. Clearly  mark and make sure that you can see: Any grab bars or handrails. First and last steps. Where the edge of each step is. Use tools that help you move around (mobility aids) if they are needed. These include: Canes. Walkers. Scooters. Crutches. Turn on the lights when you go into a dark area. Replace any light bulbs as soon as they burn out. Set up your furniture so you have a clear path. Avoid moving your furniture around. If any of your floors are uneven, fix them. If there are any pets around you, be aware of where they are. Review your medicines with your doctor. Some medicines can make you feel dizzy. This can increase your chance of falling. Ask your doctor what other things that you can do to help prevent falls. This information is not intended to replace advice given to you by your health care provider. Make sure you discuss any questions you have with your health care provider. Document Released: 03/24/2009 Document Revised: 11/03/2015 Document Reviewed: 07/02/2014 Elsevier Interactive [employer]  2017 [employer].

## 2022-08-20 ENCOUNTER — Other Ambulatory Visit: Payer: Self-pay | Admitting: Family Medicine

## 2022-08-22 ENCOUNTER — Other Ambulatory Visit: Payer: Self-pay | Admitting: Family Medicine

## 2022-08-24 DIAGNOSIS — E875 Hyperkalemia: Secondary | ICD-10-CM | POA: Diagnosis not present

## 2022-08-24 DIAGNOSIS — E1122 Type 2 diabetes mellitus with diabetic chronic kidney disease: Secondary | ICD-10-CM | POA: Diagnosis not present

## 2022-08-24 DIAGNOSIS — N400 Enlarged prostate without lower urinary tract symptoms: Secondary | ICD-10-CM | POA: Diagnosis not present

## 2022-08-24 DIAGNOSIS — N1832 Chronic kidney disease, stage 3b: Secondary | ICD-10-CM | POA: Diagnosis not present

## 2022-08-24 DIAGNOSIS — D631 Anemia in chronic kidney disease: Secondary | ICD-10-CM | POA: Diagnosis not present

## 2022-08-24 DIAGNOSIS — N189 Chronic kidney disease, unspecified: Secondary | ICD-10-CM | POA: Diagnosis not present

## 2022-08-24 DIAGNOSIS — I129 Hypertensive chronic kidney disease with stage 1 through stage 4 chronic kidney disease, or unspecified chronic kidney disease: Secondary | ICD-10-CM | POA: Diagnosis not present

## 2022-09-04 ENCOUNTER — Ambulatory Visit: Payer: Medicare HMO | Admitting: Internal Medicine

## 2022-09-04 ENCOUNTER — Encounter: Payer: Self-pay | Admitting: Internal Medicine

## 2022-09-04 ENCOUNTER — Other Ambulatory Visit: Payer: Self-pay

## 2022-09-04 VITALS — BP 135/75 | HR 66 | Resp 16 | Ht 64.0 in | Wt 157.0 lb

## 2022-09-04 DIAGNOSIS — Z96611 Presence of right artificial shoulder joint: Secondary | ICD-10-CM

## 2022-09-04 DIAGNOSIS — T8450XD Infection and inflammatory reaction due to unspecified internal joint prosthesis, subsequent encounter: Secondary | ICD-10-CM | POA: Diagnosis not present

## 2022-09-04 NOTE — Progress Notes (Signed)
[facility] for Infectious Disease  Patient Active Problem List   Diagnosis Date Noted   Preventative health care 09/28/2021   Septic joint of right shoulder region [facility]) 04/20/2021   Insomnia 12/24/2020   Renal insufficiency 06/26/2020   BCC (basal cell carcinoma of skin) 06/26/2020   Right shoulder pain 06/26/2020   CAD (coronary artery disease) 06/15/2015   Essential hypertension 06/15/2015   Anemia 06/15/2015   NSTEMI (non-ST elevated myocardial infarction) ([place]) 04/30/2015   Dyspnea 05/22/2013   Hyperlipidemia 05/22/2013   Diabetes mellitus type 2, noninsulin dependent ([place]) 10/17/2009   ALLERGIC RHINITIS 10/17/2009   PULMONARY NODULE, RIGHT LOWER LOBE 10/17/2009      Subjective:    Patient ID: [facility], male    DOB: [birth year], 89 y.o.   MRN: [id number]  Cc: f/u prosthetic shoulder infection  HPI:  [facility] is a 89 y.o. male here for ongoing id care for prosthetic shoulder infection  I saw him last on 04/24/21 in the hospital At that time cx not growing yet so d/c'ed on empiric doxy/ceftriaxone He underwent hardware removal with placement abx spacer on 11/10 that ultimately grew p-acnes (2 out of 3 samples; 1 of 3 samles stain gpc/gpr but no growth)  05/02/21 I called lab. The tissue culture "normal flora" has p acnes growing with mixed flora (no strep group a/staph). The other 2 samples only grew p-acnes and was collected 1700. The one with mixed flora was collected 1730   Patient reported that his surgeon has no plan at this time to replace a new joint as his bone stock is minimal  No diarrhea, n/v He is actually constipated  He does report a little irritation at the end of his urination; no urgency. Has chronic frequency every few hours. No blood  12/06 id clinic f/u I had patient continuing on doxycycline only and stopped cefadroxil for gram negative coverage. The aspirate of his joint only grew p-acnes while the operative cx grew  both p-acnes and some other gram positive flora (only 1 sample) which I suspect is either lab contaminant No f/c Pain improving  06/21/21 id clinic f/u Moderate pain stable Nausea but able to eat Yellowish/red discoloration anterior of the right shoulder No f/c No diarrhea No other rash Last good bowel movement a week ago. Just a small one the morning of this visit No abdominal pain   08/08/21 id f/u Patient was given zofran to take as needed for nausea due to doxy. He takes zofran once day Had severe allergy to penicillin in his 51's and haven't taken any pcn analogue since then. Took cefadroxil in 04/2021 though and tolerated He is ok with keeping on doxy/zofran for now Shoulder pain right side stable 5-6/10 since after surgery No fever, chill No diarrhea, but constipation   09/26/21 id fu Patient very happy today. Despite having only abx spacer in his right shoulder, he has minimal pain (compared to what he had the past several years even with the replacement joint). No f/c No further nausea. Not taking zofran His surgeon is offering him to proceed with the second stage replacement joint, but patient is at this time not thinking to get one   12/19/21 id f/u He is doing well He wants to avoid replacing his shoulder joint and just keep with the abx spacer No f/c No n/v/diarrhea No sun burn   05/17/22 id f/u No complaint Stable pain No duskiness or rash around joint;  no swelling No n/v/diarrhea Wants to keep going with abx spacer and not doing any further surgery Want to go off abx and see   09/04/22 id clinic f/u Doing very well Mild ache in shoulder. Nothing worse since stopping abx 3 months ago No fever/chill   Allergies  Allergen Reactions   Lipitor [Atorvastatin]     Joint and muscle pain   Penicillins     swelling in joints      Outpatient Medications Prior to Visit  Medication Sig Dispense Refill   aspirin EC 81 MG tablet Take 4 tablets (325 mg total)  by mouth daily. (Patient taking differently: Take 81 mg by mouth daily.)     chlorhexidine (PERIDEX) 0.12 % solution After brushing, rinse with one capful for one minute twice a day then spit out 473 mL 0   Cholecalciferol (VITAMIN D) 50 MCG (2000 UT) tablet Take 2,000 Units by mouth daily.     Coenzyme Q10 (COQ10) 100 MG CAPS Take 100 mg by mouth daily.     doxazosin (CARDURA) 8 MG tablet TAKE 1 TABLET AT BEDTIME 90 tablet 1   ferrous gluconate (FERGON) 240 (27 FE) MG tablet Take 240 mg by mouth daily.     finasteride (PROSCAR) 5 MG tablet TAKE 1 TABLET EVERY DAY 90 tablet 3   glimepiride (AMARYL) 4 MG tablet TAKE 1 TABLET EVERY DAY BEFORE BREAKFAST 90 tablet 3   metFORMIN (GLUCOPHAGE) 500 MG tablet Take 2 tablets (1,000 mg total) by mouth 2 (two) times daily with a meal. 360 tablet 1   metoprolol tartrate (LOPRESSOR) 25 MG tablet Take 0.5 tablets (12.5 mg total) by mouth 2 (two) times daily. 90 tablet 1   Multiple Vitamin (MULTIVITAMIN WITH MINERALS) TABS tablet Take 1 tablet by mouth daily.     omeprazole (PRILOSEC) 40 MG capsule Take 1 capsule (40 mg total) by mouth daily. 90 capsule 1   pioglitazone (ACTOS) 15 MG tablet TAKE 1 TABLET EVERY DAY 90 tablet 1   rosuvastatin (CRESTOR) 5 MG tablet Take 1 tablet (5 mg total) by mouth daily. 90 tablet 1   traMADol (ULTRAM) 50 MG tablet TAKE 1 TABLET EVERY 12 HOURS AS NEEDED FOR PAIN 20 tablet 0   zolpidem (AMBIEN) 10 MG tablet TAKE 1 TABLET AT BEDTIME AS NEEDED FOR SLEEP 90 tablet 1   imiquimod (ALDARA) 5 % cream Apply topically every day as tolerated for 6 weeks. (Patient not taking: Reported on 05/17/2022) 12 each 3   nitroGLYCERIN (NITROSTAT) 0.4 MG SL tablet PLACE 1 TABLET UNDER THE TONGUE EVERY 5 MINUTES AS NEEDED FOR CHESTPAIN (Patient not taking: Reported on 05/17/2022) 25 tablet 3   sodium polystyrene (KAYEXALATE) 15 GM/60ML suspension Take 60 milliliters (15 grams total) by mouth daily for 4 days. (Patient not taking: Reported on 05/17/2022) 240  mL 0   No facility-administered medications prior to visit.     Social History   Socioeconomic History   Marital status: Married    Spouse name: Not on file   Number of children: 1   Years of education: Not on file   Highest education level: Not on file  Occupational History   Occupation: Retired  Tobacco Use   Smoking status: Never    Passive exposure: Never   Smokeless tobacco: Never  Vaping Use   Vaping Use: Never used  Substance and Sexual Activity   Alcohol use: No   Drug use: No   Sexual activity: Not on file  Other Topics Concern  Not on file  Social History Narrative   Lives in [place], [place] with wife. No pets. No dietary restrictions      Retired from truck driving, long haul   No cigarettes, alcohol to excess   Seat belts every time.    Social Determinants of Health   Financial Resource Strain: Low Risk  (07/24/2021)   Overall Financial Resource Strain (CARDIA)    Difficulty of Paying Living Expenses: Not hard at all  Food Insecurity: No Food Insecurity (07/25/2022)   Hunger Vital Sign    Worried About Running Out of Food in the Last Year: Never true    Ran Out of Food in the Last Year: Never true  Transportation Needs: No Transportation Needs (07/25/2022)   PRAPARE - Hydrologist (Medical): No    Lack of Transportation (Non-Medical): No  Physical Activity: Inactive (07/24/2021)   Exercise Vital Sign    Days of Exercise per Week: 0 days    Minutes of Exercise per Session: 0 min  Stress: No Stress Concern Present (07/24/2021)   [place]    Feeling of Stress : Not at all  Social Connections: Moderately Integrated (07/24/2021)   Social Connection and Isolation Panel [NHANES]    Frequency of Communication with Friends and Family: More than three times a week    Frequency of Social Gatherings with Friends and Family: More than three times a week    Attends  Religious Services: More than 4 times per year    Active Member of [employer] or Organizations: No    Attends Archivist Meetings: Never    Marital Status: Married  Human resources officer Violence: Not At Risk (07/25/2022)   Humiliation, Afraid, Rape, and Kick questionnaire    Fear of Current or Ex-Partner: No    Emotionally Abused: No    Physically Abused: No    Sexually Abused: No      Review of Systems All other ros negative  Objective:    BP 135/75   Pulse 66   Resp 16   Ht 5\' 4"  (1.626 m)   Wt 157 lb (71.2 kg)   SpO2 100%   BMI 26.95 kg/m  Nursing note and vital signs reviewed.  Physical Exam General/constitutional: no distress, pleasant HEENT: Normocephalic, PER, Conj Clear, EOMI, Oropharynx clear Neck supple CV: rrr no mrg Lungs: clear to auscultation, normal respiratory effort Abd: Soft, Nontender    MSK: stable exam -- right shoulder no swelling, discoloration, tenderness, or fluctuance or warmth. Active ROM internal/external is about 1/2 of normal range, and abduction is at 45-60 degree   Labs: Lab Results  Component Value Date   WBC 8.4 05/17/2022   HGB 10.7 (L) 05/17/2022   HCT 31.3 (L) 05/17/2022   MCV 97.5 05/17/2022   PLT 175 [id number]   Last metabolic panel Lab Results  Component Value Date   GLUCOSE 195 (H) 05/17/2022   NA 142 05/17/2022   K 4.8 05/17/2022   CL 110 05/17/2022   CO2 24 05/17/2022   BUN 26 (H) 05/17/2022   CREATININE 1.76 (H) 05/17/2022   GFRNONAA 46 (L) 04/24/2021   CALCIUM 9.5 05/17/2022   PROT 6.5 05/17/2022   ALBUMIN 4.2 04/10/2022   BILITOT 0.3 05/17/2022   ALKPHOS 63 04/10/2022   AST 11 05/17/2022   ALT 6 (L) 05/17/2022   ANIONGAP 10 04/20/2021    Micro:  Serology:  Imaging:  Assessment & Plan:  Problem List Items Addressed This Visit   None Visit Diagnoses     Infection of prosthetic joint, subsequent encounter    -  Primary   Relevant Orders   C-reactive protein     No orders of the  defined types were placed in this encounter.  Abx: 05/02/21-05/2022 Doxycycline (brief cefadroxil)  11/14-22 ceftriaxone   89 year old male with complicated history of right shoulder interventions started in the 1990s,  underwent right shoulder reverse arthroplasty [id number] complicated by right should pain which was worked up in 2015 admitted for right shoulder PJI.  He had presented to orthopedic clinic for a 3 cm mass over the right shoulder enlarging over the past couple of days.  s/p right shoulder PJI SP hardware removal and cement placement on 04/20/21     11/09 cx p-acnes but one plate with gpc/gpr. Suspect contaminant gpc/gpr (not worked up but no staph/strep). Aspirate cx only p-acnes  His surgeon did mention there was some retained cement that couldn't be removed and we had agreed on continue indefinite antibiotics or at least 6-12 months. At this time there is no plan for a new shoulder surgery as there is no bone stock for that   Has hx severe pcn allergy. Tolerated cefadroxil though  09/26/21 assessment His surgeon is comfortable discussing 2nd stage but patient at this time doesn't want a new joint as he is almost pain free and has the range of motion to do his normal activities. He is also tolerating doxycycline very well without further nausea  12/19/21 assessment Doing well on doxy Plan to go 12 months until end of 04/2022 with abx and stop He still doesn't want a new joint  He is to discuss the above 2 issues with his surgeon especially trial off abx on 04/2022  Follow up with me 05/2022 Continue doxy for now   05/17/22 id assessment Doing very well. Want to stop abx and see Baseline 5/10 pain with the abx spacer in place. Still do not want to do a revision 2nd stage arthroplasty Exam without swelling/tenderness/redness/warm; limited active rom but relatively full  Labs today Due to p-acnes slow indolent nature, will repeat labs/visit in 3 months (or sooner if s/s if  infection relapse)   09/04/22 assessment He has been off antibiotics since 05/2022 and still clinically doing well Will get another set of lab today In 3 more months if things continue to look well like this including labs we can dicharge from id clinic    Follow-up: Return in about 3 months (around 12/05/2022).      Jabier Mutton, [facility] [place] [phone]  pager   [phone] cell 09/04/2022, 9:44 AM

## 2022-09-04 NOTE — Patient Instructions (Signed)
You are doing very well  Will get inflammatory lab today   In 3 months if things continue to look like this and lab continues to remain normal will let you go from our clinic

## 2022-09-05 LAB — C-REACTIVE PROTEIN: CRP: 0.7 mg/L (ref <8.0)

## 2022-09-10 DIAGNOSIS — L570 Actinic keratosis: Secondary | ICD-10-CM | POA: Diagnosis not present

## 2022-09-10 DIAGNOSIS — L821 Other seborrheic keratosis: Secondary | ICD-10-CM | POA: Diagnosis not present

## 2022-09-10 DIAGNOSIS — Z85828 Personal history of other malignant neoplasm of skin: Secondary | ICD-10-CM | POA: Diagnosis not present

## 2022-09-10 DIAGNOSIS — Z86008 Personal history of in-situ neoplasm of other site: Secondary | ICD-10-CM | POA: Diagnosis not present

## 2022-09-10 DIAGNOSIS — C44319 Basal cell carcinoma of skin of other parts of face: Secondary | ICD-10-CM | POA: Diagnosis not present

## 2022-09-10 DIAGNOSIS — D485 Neoplasm of uncertain behavior of skin: Secondary | ICD-10-CM | POA: Diagnosis not present

## 2022-09-10 DIAGNOSIS — D1801 Hemangioma of skin and subcutaneous tissue: Secondary | ICD-10-CM | POA: Diagnosis not present

## 2022-09-18 ENCOUNTER — Other Ambulatory Visit: Payer: Self-pay | Admitting: Family Medicine

## 2022-10-13 DIAGNOSIS — N189 Chronic kidney disease, unspecified: Secondary | ICD-10-CM | POA: Insufficient documentation

## 2022-10-13 NOTE — Assessment & Plan Note (Signed)
Multifactorial will continue to monitor

## 2022-10-13 NOTE — Assessment & Plan Note (Signed)
Encouraged good sleep hygiene such as dark, quiet room. No blue/green glowing lights such as computer screens in bedroom. No alcohol or stimulants in evening. Cut down on caffeine as able. Regular exercise is helpful but not just prior to bed time. Ambien prn 

## 2022-10-13 NOTE — Assessment & Plan Note (Signed)
Encourage heart healthy diet such as MIND or DASH diet, increase exercise, avoid trans fats, simple carbohydrates and processed foods, consider a krill or fish or flaxseed oil cap daily. Tolerating Rosuvastatin 

## 2022-10-13 NOTE — Assessment & Plan Note (Signed)
Hydrate and monitor 

## 2022-10-13 NOTE — Assessment & Plan Note (Signed)
hgba1c acceptable, minimize simple carbs. Increase exercise as tolerated. Continue current meds 

## 2022-10-13 NOTE — Assessment & Plan Note (Signed)
Well controlled, no changes to meds. Encouraged heart healthy diet such as the DASH diet and exercise as tolerated.  °

## 2022-10-15 ENCOUNTER — Ambulatory Visit (INDEPENDENT_AMBULATORY_CARE_PROVIDER_SITE_OTHER): Payer: Medicare HMO | Admitting: Family Medicine

## 2022-10-15 ENCOUNTER — Encounter: Payer: Self-pay | Admitting: Family Medicine

## 2022-10-15 ENCOUNTER — Other Ambulatory Visit (HOSPITAL_BASED_OUTPATIENT_CLINIC_OR_DEPARTMENT_OTHER): Payer: Self-pay

## 2022-10-15 ENCOUNTER — Other Ambulatory Visit (HOSPITAL_COMMUNITY)
Admission: RE | Admit: 2022-10-15 | Discharge: 2022-10-15 | Disposition: A | Discharge status: Home / Self Care | Payer: Medicare HMO | Source: Ambulatory Visit | Attending: Family Medicine | Admitting: Family Medicine

## 2022-10-15 VITALS — BP 126/72 | HR 56 | Temp 97.5°F | Resp 16 | Ht 65.0 in | Wt 157.0 lb

## 2022-10-15 DIAGNOSIS — Z124 Encounter for screening for malignant neoplasm of cervix: Secondary | ICD-10-CM | POA: Insufficient documentation

## 2022-10-15 DIAGNOSIS — R5381 Other malaise: Secondary | ICD-10-CM | POA: Diagnosis not present

## 2022-10-15 DIAGNOSIS — D649 Anemia, unspecified: Secondary | ICD-10-CM | POA: Diagnosis not present

## 2022-10-15 DIAGNOSIS — I10 Essential (primary) hypertension: Secondary | ICD-10-CM

## 2022-10-15 DIAGNOSIS — E119 Type 2 diabetes mellitus without complications: Secondary | ICD-10-CM

## 2022-10-15 DIAGNOSIS — G4700 Insomnia, unspecified: Secondary | ICD-10-CM

## 2022-10-15 DIAGNOSIS — N189 Chronic kidney disease, unspecified: Secondary | ICD-10-CM

## 2022-10-15 DIAGNOSIS — E7800 Pure hypercholesterolemia, unspecified: Secondary | ICD-10-CM | POA: Diagnosis not present

## 2022-10-15 LAB — TSH: TSH: 3.93 u[IU]/mL (ref 0.35–5.50)

## 2022-10-15 LAB — COMPREHENSIVE METABOLIC PANEL
ALT: 8 U/L (ref 0–53)
AST: 12 U/L (ref 0–37)
Albumin: 4 g/dL (ref 3.5–5.2)
Alkaline Phosphatase: 52 U/L (ref 39–117)
BUN: 33 mg/dL — ABNORMAL HIGH (ref 6–23)
CO2: 25 mEq/L (ref 19–32)
Calcium: 9.3 mg/dL (ref 8.4–10.5)
Chloride: 108 mEq/L (ref 96–112)
Creatinine, Ser: 2.08 mg/dL — ABNORMAL HIGH (ref 0.40–1.50)
GFR: 28.04 mL/min — ABNORMAL LOW (ref >60.00)
Glucose, Bld: 140 mg/dL — ABNORMAL HIGH (ref 70–99)
Potassium: 5 mEq/L (ref 3.5–5.1)
Sodium: 142 mEq/L (ref 135–145)
Total Bilirubin: 0.4 mg/dL (ref 0.2–1.2)
Total Protein: 6.4 g/dL (ref 6.0–8.3)

## 2022-10-15 LAB — CBC WITH DIFFERENTIAL/PLATELET
Basophils Absolute: 0.1 10*3/uL (ref 0.0–0.1)
Basophils Relative: 0.6 % (ref 0.0–3.0)
Eosinophils Absolute: 0.3 10*3/uL (ref 0.0–0.7)
Eosinophils Relative: 3.3 % (ref 0.0–5.0)
HCT: 33.6 % — ABNORMAL LOW (ref 39.0–52.0)
Hemoglobin: 11.6 g/dL — ABNORMAL LOW (ref 13.0–17.0)
Lymphocytes Relative: 25.2 % (ref 12.0–46.0)
Lymphs Abs: 2.2 10*3/uL (ref 0.7–4.0)
MCHC: 34.6 g/dL (ref 30.0–36.0)
MCV: 97.9 fl (ref 78.0–100.0)
Monocytes Absolute: 0.7 10*3/uL (ref 0.1–1.0)
Monocytes Relative: 7.9 % (ref 3.0–12.0)
Neutro Abs: 5.4 10*3/uL (ref 1.4–7.7)
Neutrophils Relative %: 63 % (ref 43.0–77.0)
Platelets: 171 10*3/uL (ref 150.0–400.0)
RBC: 3.43 Mil/uL — ABNORMAL LOW (ref 4.22–5.81)
RDW: 13.1 % (ref 11.5–15.5)
WBC: 8.5 10*3/uL (ref 4.0–10.5)

## 2022-10-15 LAB — LIPID PANEL
Cholesterol: 111 mg/dL (ref 0–200)
HDL: 37.9 mg/dL — ABNORMAL LOW (ref >39.00)
LDL Cholesterol: 43 mg/dL (ref 0–99)
NonHDL: 73
Total CHOL/HDL Ratio: 3
Triglycerides: 149 mg/dL (ref 0.0–149.0)
VLDL: 29.8 mg/dL (ref 0.0–40.0)

## 2022-10-15 LAB — HEMOGLOBIN A1C: Hgb A1c MFr Bld: 7.2 % — ABNORMAL HIGH (ref 4.6–6.5)

## 2022-10-15 LAB — VITAMIN B12: Vitamin B-12: 280 pg/mL (ref 211–911)

## 2022-10-15 MED ORDER — SHINGRIX 50 MCG/0.5ML IM SUSR
0.5000 mL | Freq: Once | INTRAMUSCULAR | 0 refills | Status: AC
  Filled 2022-10-15: qty 0.5, 1d supply, fill #0

## 2022-10-15 NOTE — Assessment & Plan Note (Signed)
He continues to be frustrated with feeling week without pain in his legs when he tries to walk a distance. Once again offered PT and declines

## 2022-10-15 NOTE — Progress Notes (Addendum)
Subjective:   By signing my name below, I, Hayden [facility], attest that this documentation has been prepared under the direction and in the presence of [name], MD. 10/15/2022   Patient ID: Hayden Garza, male    DOB: [birth year], 89 y.o.   MRN: [id number]  Chief Complaint  Patient presents with   Follow-up    Follow up    HPI Patient is in today for a follow-up appointment.  Acute He denies any recent surgeries or illnesses, SOB, chest pain, or stomach issues.   Leg weakness He complains of weakness in his legs when walking around. He is not interested in physical therapy at this time.   Immunizations  He is UTD on the RSV immunization. He has not yet received the 2nd dose of the shingles immunization.  Past Medical History:  Diagnosis Date   Allergic rhinitis    Anemia    BPH (benign prostatic hyperplasia)    With obstruction/lower urinary tract symptoms. Takes cardura for prostate.    CAD (coronary artery disease)    a. 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    Past Surgical History:  Procedure Laterality Date   APPENDECTOMY     BACK SURGERY     vertebral collapse in lower back had insturment, reports no metal   CARDIAC CATHETERIZATION N/A 05/02/2015   Procedure: Left Heart Cath and Coronary Angiography;  Surgeon: Runell Gess, MD;  Location: [facility] INVASIVE CV LAB;  Service: Cardiovascular;  Laterality: N/A;   CARDIAC CATHETERIZATION N/A 05/02/2015   Procedure: Coronary Stent Intervention;  Surgeon: Runell Gess, MD;  Location: [facility];  Service: Cardiovascular;  Laterality: N/A;   CORONARY ANGIOPLASTY WITH STENT PLACEMENT     JOINT REPLACEMENT Right    6 surgeries including  total shoulder replacement roughly in 2000   MOHS SURGERY     TOTAL SHOULDER REPLACEMENT     TOTAL SHOULDER REVISION Right 04/20/2021   Procedure: Removal of infected right shoulder arthroplasty, implantation of cement spacer;  Surgeon: Francena Hanly, MD;  Location: [facility] ORS;  Service: Orthopedics;  Laterality: Right;    Family History  Problem Relation Age of Onset   Heart disease Mother        chf   Heart attack Father    CAD Father    Colitis Sister    Other Brother        head trauma   Heart disease Brother    Hyperlipidemia Brother    Hypertension Brother    Diabetes Brother    Kidney failure Brother    Diabetes Son    Kidney failure Maternal Aunt     Social History   Socioeconomic History   Marital status: Married    Spouse name: Not on file   Number of children: 1   Years of education: Not on file   Highest education level: Not on file  Occupational History   Occupation: Retired  Tobacco Use   Smoking status: Never    Passive exposure: Never   Smokeless tobacco: Never  Vaping Use   Vaping Use: Never used  Substance and Sexual Activity  Alcohol use: No   Drug use: No   Sexual activity: Not on file  Other Topics Concern   Not on file  Social History Narrative   Lives in [place], [place] with wife. No pets. No dietary restrictions      Retired from truck driving, long haul   No cigarettes, alcohol to excess   Seat belts every time.    Social Determinants of Health   Financial Resource Strain: Low Risk  (07/24/2021)   Overall Financial Resource Strain (CARDIA)    Difficulty of Paying Living Expenses: Not hard at all  Food Insecurity: No Food Insecurity (07/25/2022)   Hunger Vital Sign    Worried About Running Out of Food in the Last Year: Never true    Ran Out of Food in the Last Year: Never true  Transportation Needs: No Transportation Needs (07/25/2022)   PRAPARE - Administrator, Civil Service (Medical): No    Lack of Transportation  (Non-Medical): No  Physical Activity: Inactive (07/24/2021)   Exercise Vital Sign    Days of Exercise per Week: 0 days    Minutes of Exercise per Session: 0 min  Stress: No Stress Concern Present (07/24/2021)   [employer] of Occupational Health - Occupational Stress Questionnaire    Feeling of Stress : Not at all  Social Connections: Moderately Integrated (07/24/2021)   Social Connection and Isolation Panel [NHANES]    Frequency of Communication with Friends and Family: More than three times a week    Frequency of Social Gatherings with Friends and Family: More than three times a week    Attends Religious Services: More than 4 times per year    Active Member of [employer] or Organizations: No    Attends Banker Meetings: Never    Marital Status: Married  Catering manager Violence: Not At Risk (07/25/2022)   Humiliation, Afraid, Rape, and Kick questionnaire    Fear of Current or Ex-Partner: No    Emotionally Abused: No    Physically Abused: No    Sexually Abused: No    Outpatient Medications Prior to Visit  Medication Sig Dispense Refill   aspirin EC 81 MG tablet Take 4 tablets (325 mg total) by mouth daily. (Patient taking differently: Take 81 mg by mouth daily.)     chlorhexidine (PERIDEX) 0.12 % solution After brushing, rinse with one capful for one minute twice a day then spit out 473 mL 0   Cholecalciferol (VITAMIN D) 50 MCG (2000 UT) tablet Take 2,000 Units by mouth daily.     Coenzyme Q10 (COQ10) 100 MG CAPS Take 100 mg by mouth daily.     doxazosin (CARDURA) 8 MG tablet TAKE 1 TABLET AT BEDTIME 90 tablet 1   ferrous gluconate (FERGON) 240 (27 FE) MG tablet Take 240 mg by mouth daily.     finasteride (PROSCAR) 5 MG tablet TAKE 1 TABLET EVERY DAY 90 tablet 3   glimepiride (AMARYL) 4 MG tablet TAKE 1 TABLET EVERY DAY BEFORE BREAKFAST 90 tablet 3   imiquimod (ALDARA) 5 % cream Apply topically every day as tolerated for 6 weeks. 12 each 3   metoprolol tartrate  (LOPRESSOR) 25 MG tablet Take 0.5 tablets (12.5 mg total) by mouth 2 (two) times daily. 90 tablet 1   Multiple Vitamin (MULTIVITAMIN WITH MINERALS) TABS tablet Take 1 tablet by mouth daily.     omeprazole (PRILOSEC) 40 MG capsule Take 1 capsule (40 mg total) by mouth daily. 90 capsule 1  rosuvastatin (CRESTOR) 5 MG tablet Take 1 tablet (5 mg total) by mouth daily. 90 tablet 1   sodium polystyrene (KAYEXALATE) 15 GM/60ML suspension Take 60 milliliters (15 grams total) by mouth daily for 4 days. 240 mL 0   traMADol (ULTRAM) 50 MG tablet TAKE 1 TABLET EVERY 12 HOURS AS NEEDED FOR PAIN 20 tablet 0   zolpidem (AMBIEN) 10 MG tablet TAKE 1 TABLET AT BEDTIME AS NEEDED FOR SLEEP 90 tablet 1   metFORMIN (GLUCOPHAGE) 500 MG tablet Take 2 tablets (1,000 mg total) by mouth 2 (two) times daily with a meal. 360 tablet 1   pioglitazone (ACTOS) 15 MG tablet TAKE 1 TABLET EVERY DAY 90 tablet 1   nitroGLYCERIN (NITROSTAT) 0.4 MG SL tablet PLACE 1 TABLET UNDER THE TONGUE EVERY 5 MINUTES AS NEEDED FOR CHESTPAIN (Patient not taking: Reported on 05/17/2022) 25 tablet 3   No facility-administered medications prior to visit.    Allergies  Allergen Reactions   Lipitor [Atorvastatin]     Joint and muscle pain   Penicillins     swelling in joints    Review of Systems  Respiratory:  Negative for shortness of breath.   Cardiovascular:  Negative for chest pain.       Objective:    Physical Exam Constitutional:      General: He is not in acute distress.    Appearance: Normal appearance.  HENT:     Head: Normocephalic and atraumatic.     Right Ear: External ear normal.     Left Ear: External ear normal.  Eyes:     Extraocular Movements: Extraocular movements intact.     Pupils: Pupils are equal, round, and reactive to light.  Cardiovascular:     Rate and Rhythm: Normal rate and regular rhythm.     Heart sounds: Normal heart sounds. No murmur heard.    No gallop.  Pulmonary:     Effort: Pulmonary effort  is normal. No respiratory distress.     Breath sounds: Normal breath sounds. No wheezing or rales.  Skin:    General: Skin is warm.  Neurological:     Mental Status: He is alert and oriented to person, place, and time.  Psychiatric:        Judgment: Judgment normal.     BP 126/72 (BP Location: Right Arm, Patient Position: Sitting, Cuff Size: Normal)   Pulse (!) 56   Temp (!) 97.5 F (36.4 C) (Oral)   Resp 16   Ht 5\' 5"  (1.651 m)   Wt 157 lb (71.2 kg)   SpO2 98%   BMI 26.13 kg/m  Wt Readings from Last 3 Encounters:  10/15/22 157 lb (71.2 kg)  09/04/22 157 lb (71.2 kg)  05/17/22 156 lb (70.8 kg)       Assessment & Plan:   Diabetes mellitus type 2, noninsulin dependent (HCC) hgba1c acceptable, minimize simple carbs. Increase exercise as tolerated. Continue current meds.  Hyperlipidemia Encourage heart healthy diet such as MIND or DASH diet, increase exercise, avoid trans fats, simple carbohydrates and processed foods, consider a krill or fish or flaxseed oil cap daily. Tolerating Rosuvastatin  Essential hypertension Well controlled, no changes to meds. Encouraged heart healthy diet such as the DASH diet and exercise as tolerated.   Insomnia Encouraged good sleep hygiene such as dark, quiet room. No blue/green glowing lights such as computer screens in bedroom. No alcohol or stimulants in evening. Cut down on caffeine as able. Regular exercise is helpful but not just prior to  bed time. Ambien prn  Anemia Multifactorial will continue to monitor  CRI (chronic renal insufficiency) Hydrate and monitor   Physical debility He continues to be frustrated with feeling week without pain in his legs when he tries to walk a distance. Once again offered PT and declines  I, Danise Edge, MD, personally preformed the services described in this documentation.  All medical record entries made by the scribe were at my direction and in my presence.  I have reviewed the chart and discharge  instructions (if applicable) and agree that the record reflects my personal performance and is accurate and complete. 10/15/2022  Danise Edge, MD   [facility] as a scribe for Danise Edge, MD.,have documented all relevant documentation on the behalf of Danise Edge, MD,as directed by  Danise Edge, MD while in the presence of Danise Edge, MD.

## 2022-10-15 NOTE — Patient Instructions (Signed)
Go downstairs and get your second Shingrix shot   Anemia  Anemia is a condition in which there are not enough red blood cells or hemoglobin in the blood. Hemoglobin is a substance in red blood cells that carries oxygen. When you do not have enough red blood cells or hemoglobin (are anemic), your body cannot get enough oxygen, and your organs may not work properly. As a result, you may feel very tired or have other problems. What are the causes? Common causes of anemia include: Excessive bleeding. Anemia can be caused by excessive bleeding inside or outside the body, including bleeding from the intestines or from heavy menstrual periods in females. Poor nutrition. Long-lasting (chronic) kidney, thyroid, and liver disease. Bone marrow disorders, spleen problems, and blood disorders. Cancer and treatments for cancer. Human immunodeficiency virus (HIV) and acquired immunodeficiency syndrome (AIDS). Infections, medicines, and autoimmune disorders that destroy red blood cells. What are the signs or symptoms? Symptoms of this condition include: Minor weakness. Dizziness. Headache, or difficulties concentrating and sleeping. Heartbeats that feel irregular or faster than normal (palpitations). Shortness of breath, especially with exercise. Pale skin, lips, and nails, or cold hands and feet. Upset stomach (indigestion) and nausea. Symptoms may occur suddenly or develop slowly. If your anemia is mild, you may not have symptoms. How is this diagnosed? This condition is diagnosed based on blood tests, your medical history, and a physical exam. In some cases, a test may be needed in which cells are removed from the soft tissue inside of a bone and looked at under a microscope (bone marrow biopsy). Your health care provider may also check your stool (feces) for blood and may do more testing to look for the cause of your bleeding. Other tests may include: Imaging tests, such as a CT scan or MRI. A  procedure to see inside your esophagus and stomach (endoscopy). The esophagus is the part of the body that moves food from your mouth to your stomach. A procedure to see inside your colon and rectum (colonoscopy). How is this treated? Treatment for this condition depends on the cause. If you continue to lose a lot of blood, you may need to be treated at a hospital. Treatment may include: Taking supplements of iron, vitamin B12, or folic acid. Taking a hormone medicine (erythropoietin) that can help to stimulate red blood cell growth. Receiving donated blood through an IV (blood transfusion). This may be needed if you lose a lot of blood. Making changes to your diet. Having surgery to remove your spleen. Follow these instructions at home: Take over-the-counter and prescription medicines only as told by your health care provider. Take supplements only as told by your health care provider. Follow any diet instructions that you were given by your health care provider. Keep all follow-up visits. Your health care provider will want to recheck your blood tests. Contact a health care provider if: You develop new bleeding anywhere in the body. You are very weak. Get help right away if: You are short of breath. You have pain in your abdomen or chest. You are dizzy or feel faint. You have trouble concentrating. You have bloody stools, black stools, or tarry stools. You vomit repeatedly or you vomit up blood. These symptoms may be an emergency. Get help right away. Call 911. Do not wait to see if the symptoms will go away. Do not drive yourself to the hospital. Summary Anemia is a condition in which you do not have enough red blood cells or enough of  a substance in your red blood cells that carries oxygen. Symptoms may occur suddenly or develop slowly. If your anemia is mild, you may not have symptoms. This condition is diagnosed with blood tests, a medical history, and a physical exam. Other  tests may be needed. Treatment for this condition depends on the cause of the anemia. This information is not intended to replace advice given to you by your health care provider. Make sure you discuss any questions you have with your health care provider. Document Revised: 08/21/2021 Document Reviewed: 08/21/2021 Elsevier Patient Education  2023 [employer].

## 2022-10-16 ENCOUNTER — Other Ambulatory Visit: Payer: Self-pay

## 2022-10-16 ENCOUNTER — Telehealth: Payer: Self-pay | Admitting: Family Medicine

## 2022-10-16 DIAGNOSIS — I10 Essential (primary) hypertension: Secondary | ICD-10-CM

## 2022-10-16 LAB — IRON,TIBC AND FERRITIN PANEL
%SAT: 29 % (calc) (ref 20–48)
Ferritin: 30 ng/mL (ref 24–380)
Iron: 85 ug/dL (ref 50–180)
TIBC: 296 mcg/dL (calc) (ref 250–425)

## 2022-10-16 LAB — CYTOLOGY - PAP: Diagnosis: NEGATIVE

## 2022-10-16 MED ORDER — PIOGLITAZONE HCL 30 MG PO TABS: 30.0000 mg | ORAL_TABLET | Freq: Every day | ORAL | 2 refills | Status: DC

## 2022-10-16 NOTE — Telephone Encounter (Signed)
Patient called after receiving a phone call regarding lab results.

## 2022-10-17 DIAGNOSIS — D3132 Benign neoplasm of left choroid: Secondary | ICD-10-CM | POA: Diagnosis not present

## 2022-10-17 DIAGNOSIS — H18513 Endothelial corneal dystrophy, bilateral: Secondary | ICD-10-CM | POA: Diagnosis not present

## 2022-10-17 DIAGNOSIS — H524 Presbyopia: Secondary | ICD-10-CM | POA: Diagnosis not present

## 2022-10-17 DIAGNOSIS — E119 Type 2 diabetes mellitus without complications: Secondary | ICD-10-CM | POA: Diagnosis not present

## 2022-10-17 DIAGNOSIS — H40013 Open angle with borderline findings, low risk, bilateral: Secondary | ICD-10-CM | POA: Diagnosis not present

## 2022-10-17 DIAGNOSIS — H35363 Drusen (degenerative) of macula, bilateral: Secondary | ICD-10-CM | POA: Diagnosis not present

## 2022-10-17 DIAGNOSIS — Z961 Presence of intraocular lens: Secondary | ICD-10-CM | POA: Diagnosis not present

## 2022-10-18 NOTE — Progress Notes (Signed)
Order(s) created erroneously. Erroneous order ID: [id number]  Order moved by: CHART CORRECTION ANALYST SEVEN, IDENTITY  Order move date/time: 10/18/2022 2:46 PM  Source Patient: [id number]  Source Contact: 10/15/2022  Destination Patient: [id number]  Destination Contact: 10/15/2022 

## 2022-10-23 DIAGNOSIS — C44319 Basal cell carcinoma of skin of other parts of face: Secondary | ICD-10-CM | POA: Diagnosis not present

## 2022-10-24 ENCOUNTER — Other Ambulatory Visit (INDEPENDENT_AMBULATORY_CARE_PROVIDER_SITE_OTHER): Payer: Medicare HMO

## 2022-10-24 DIAGNOSIS — I10 Essential (primary) hypertension: Secondary | ICD-10-CM | POA: Diagnosis not present

## 2022-10-24 LAB — COMPREHENSIVE METABOLIC PANEL
ALT: 8 U/L (ref 0–53)
AST: 12 U/L (ref 0–37)
Albumin: 3.9 g/dL (ref 3.5–5.2)
Alkaline Phosphatase: 53 U/L (ref 39–117)
BUN: 32 mg/dL — ABNORMAL HIGH (ref 6–23)
CO2: 26 mEq/L (ref 19–32)
Calcium: 9.1 mg/dL (ref 8.4–10.5)
Chloride: 108 mEq/L (ref 96–112)
Creatinine, Ser: 2.09 mg/dL — ABNORMAL HIGH (ref 0.40–1.50)
GFR: 27.88 mL/min — ABNORMAL LOW (ref >60.00)
Glucose, Bld: 178 mg/dL — ABNORMAL HIGH (ref 70–99)
Potassium: 5.3 mEq/L — ABNORMAL HIGH (ref 3.5–5.1)
Sodium: 142 mEq/L (ref 135–145)
Total Bilirubin: 0.4 mg/dL (ref 0.2–1.2)
Total Protein: 6.4 g/dL (ref 6.0–8.3)

## 2022-10-25 ENCOUNTER — Other Ambulatory Visit: Payer: Self-pay

## 2022-10-25 DIAGNOSIS — I10 Essential (primary) hypertension: Secondary | ICD-10-CM

## 2022-10-31 ENCOUNTER — Other Ambulatory Visit: Payer: Self-pay

## 2022-10-31 ENCOUNTER — Telehealth: Payer: Self-pay | Admitting: Family Medicine

## 2022-10-31 ENCOUNTER — Other Ambulatory Visit (HOSPITAL_BASED_OUTPATIENT_CLINIC_OR_DEPARTMENT_OTHER): Payer: Self-pay

## 2022-10-31 MED ORDER — PIOGLITAZONE HCL 30 MG PO TABS
30.0000 mg | ORAL_TABLET | Freq: Every day | ORAL | 2 refills | Status: DC
  Filled 2022-10-31 – 2022-11-06 (×2): qty 90, 90d supply, fill #0

## 2022-10-31 NOTE — Telephone Encounter (Signed)
Pt spouse came in office stating provider had sent in rx for pioglitazone (ACTOS) 30 MG tablet  to [facility] on 10-16-2022 but pt still has not received the rx, pt would like rx to be sent to [facility]. Please advise. Pt tel [phone].

## 2022-10-31 NOTE — Telephone Encounter (Signed)
pioglitazone (ACTOS) 30 MG table  refill has been sent

## 2022-11-06 ENCOUNTER — Other Ambulatory Visit: Payer: Self-pay

## 2022-11-06 ENCOUNTER — Other Ambulatory Visit (HOSPITAL_BASED_OUTPATIENT_CLINIC_OR_DEPARTMENT_OTHER): Payer: Self-pay

## 2022-11-06 ENCOUNTER — Telehealth: Payer: Self-pay | Admitting: Family Medicine

## 2022-11-06 ENCOUNTER — Other Ambulatory Visit: Payer: Medicare HMO

## 2022-11-06 DIAGNOSIS — E119 Type 2 diabetes mellitus without complications: Secondary | ICD-10-CM

## 2022-11-06 MED ORDER — METFORMIN HCL 500 MG PO TABS
500.0000 mg | ORAL_TABLET | Freq: Every day | ORAL | 1 refills | Status: DC
Start: 2022-11-06 — End: 2023-04-25

## 2022-11-06 NOTE — Telephone Encounter (Signed)
Pt called back and he stated that he picked up the wrong medication at the pharmacy.  I advised him that was what he suppose to get because it was an increase.  He stated that he has already been taking that dose.   He got it from mail order.  So he did receive the mail order for the 30mg  pioglitazone at the beginning of the month.  Advised pt to just keep what he got and take it.  He has 6 months worth now.

## 2022-11-06 NOTE — Telephone Encounter (Signed)
Called [facility] to check on RX, as [facility] can not fill RX since this was already shipped. [facility] has a confirmation that this was delivered to address on file: [address] - on 10/20/2022 at 4:08 pm.   Called patient's number to let them know. LVM to call back with questions.

## 2022-11-06 NOTE — Telephone Encounter (Signed)
Called and spoke with [facility] pharmacy and they stated that medication was delivered on 10/20/22 at 4:08pm.  Advised that patient did not get medication.  She stated that pt can call customer service at [phone] and let them know that he did not get medication and ask for a reshipment.    Also Phil at [facility] stated that patient got a 90 day supply from them but he still needs to call customer service to let them know what happened.

## 2022-11-06 NOTE — Telephone Encounter (Signed)
Pt states he has not been able to start pioglitazone (ACTOS) 30 MG tablet because pharmacy stated it was already filled at another pharmacy. Shamaine made aware and stated he will call pt once rx is filled. Pt r/s labs to next week.

## 2022-11-13 ENCOUNTER — Other Ambulatory Visit (HOSPITAL_BASED_OUTPATIENT_CLINIC_OR_DEPARTMENT_OTHER): Payer: Self-pay

## 2022-11-15 ENCOUNTER — Other Ambulatory Visit (INDEPENDENT_AMBULATORY_CARE_PROVIDER_SITE_OTHER): Payer: Medicare HMO

## 2022-11-15 ENCOUNTER — Other Ambulatory Visit (HOSPITAL_BASED_OUTPATIENT_CLINIC_OR_DEPARTMENT_OTHER): Payer: Self-pay

## 2022-11-15 DIAGNOSIS — I10 Essential (primary) hypertension: Secondary | ICD-10-CM | POA: Diagnosis not present

## 2022-11-15 LAB — COMPREHENSIVE METABOLIC PANEL
ALT: 9 U/L (ref 0–53)
AST: 12 U/L (ref 0–37)
Albumin: 4.2 g/dL (ref 3.5–5.2)
Alkaline Phosphatase: 53 U/L (ref 39–117)
BUN: 37 mg/dL — ABNORMAL HIGH (ref 6–23)
CO2: 24 mEq/L (ref 19–32)
Calcium: 9.5 mg/dL (ref 8.4–10.5)
Chloride: 109 mEq/L (ref 96–112)
Creatinine, Ser: 2.05 mg/dL — ABNORMAL HIGH (ref 0.40–1.50)
GFR: 28.52 mL/min — ABNORMAL LOW (ref >60.00)
Glucose, Bld: 161 mg/dL — ABNORMAL HIGH (ref 70–99)
Potassium: 5.3 mEq/L — ABNORMAL HIGH (ref 3.5–5.1)
Sodium: 142 mEq/L (ref 135–145)
Total Bilirubin: 0.4 mg/dL (ref 0.2–1.2)
Total Protein: 6.7 g/dL (ref 6.0–8.3)

## 2022-11-16 ENCOUNTER — Other Ambulatory Visit (HOSPITAL_BASED_OUTPATIENT_CLINIC_OR_DEPARTMENT_OTHER): Payer: Self-pay

## 2022-11-16 ENCOUNTER — Telehealth: Payer: Self-pay | Admitting: Family Medicine

## 2022-11-16 NOTE — Telephone Encounter (Signed)
Pt came in the office to schedule his labs. Scheduled pt for the labs in 3 weeks, per the comments under the results. Please make sure orders are in and also does not look like rx has been sent in just yet. He was going downstairs to double check after he left.

## 2022-11-16 NOTE — Telephone Encounter (Signed)
Labs are order

## 2022-11-19 ENCOUNTER — Other Ambulatory Visit: Payer: Self-pay

## 2022-11-19 ENCOUNTER — Other Ambulatory Visit (HOSPITAL_BASED_OUTPATIENT_CLINIC_OR_DEPARTMENT_OTHER): Payer: Self-pay

## 2022-11-19 DIAGNOSIS — E875 Hyperkalemia: Secondary | ICD-10-CM

## 2022-11-19 MED ORDER — SODIUM POLYSTYRENE SULFONATE 15 GM/60ML PO SUSP
15.0000 g | Freq: Every day | ORAL | 0 refills | Status: DC
Start: 2022-11-19 — End: 2023-04-01
  Filled 2022-11-19: qty 240, 4d supply, fill #0

## 2022-11-19 MED ORDER — SODIUM POLYSTYRENE SULFONATE PO POWD
Freq: Once | ORAL | 0 refills | Status: AC
  Filled 2022-11-19: qty 454, 90d supply, fill #0

## 2022-11-20 ENCOUNTER — Other Ambulatory Visit (HOSPITAL_COMMUNITY): Payer: Self-pay

## 2022-11-20 ENCOUNTER — Other Ambulatory Visit (HOSPITAL_BASED_OUTPATIENT_CLINIC_OR_DEPARTMENT_OTHER): Payer: Self-pay

## 2022-11-21 ENCOUNTER — Other Ambulatory Visit (HOSPITAL_BASED_OUTPATIENT_CLINIC_OR_DEPARTMENT_OTHER): Payer: Self-pay

## 2022-11-22 ENCOUNTER — Ambulatory Visit: Payer: Medicare HMO | Admitting: Internal Medicine

## 2022-11-27 ENCOUNTER — Other Ambulatory Visit: Payer: Self-pay | Admitting: Family Medicine

## 2022-11-27 ENCOUNTER — Telehealth: Payer: Self-pay | Admitting: Family Medicine

## 2022-11-27 MED ORDER — ZOLPIDEM TARTRATE 10 MG PO TABS: 10.0000 mg | ORAL_TABLET | Freq: Every evening | ORAL | 1 refills | Status: DC | PRN

## 2022-11-27 NOTE — Telephone Encounter (Signed)
Pt stated is needing refill for zolpidem (AMBIEN) 10 MG tablet  for 6 months sent to  [facility] - [place], [place] - [address] [place] [address] Phone: [phone]  Fax: [phone]    Please advise.

## 2022-12-03 ENCOUNTER — Other Ambulatory Visit (HOSPITAL_BASED_OUTPATIENT_CLINIC_OR_DEPARTMENT_OTHER): Payer: Self-pay

## 2022-12-04 ENCOUNTER — Other Ambulatory Visit: Payer: Self-pay | Admitting: Family Medicine

## 2022-12-07 ENCOUNTER — Other Ambulatory Visit: Payer: Medicare HMO

## 2022-12-20 ENCOUNTER — Other Ambulatory Visit: Payer: Medicare HMO

## 2023-01-14 ENCOUNTER — Other Ambulatory Visit (INDEPENDENT_AMBULATORY_CARE_PROVIDER_SITE_OTHER): Payer: Medicare HMO

## 2023-01-14 DIAGNOSIS — E875 Hyperkalemia: Secondary | ICD-10-CM | POA: Diagnosis not present

## 2023-01-14 LAB — COMPREHENSIVE METABOLIC PANEL
ALT: 10 U/L (ref 0–53)
AST: 12 U/L (ref 0–37)
Albumin: 4.1 g/dL (ref 3.5–5.2)
Alkaline Phosphatase: 58 U/L (ref 39–117)
BUN: 36 mg/dL — ABNORMAL HIGH (ref 6–23)
CO2: 24 mEq/L (ref 19–32)
Calcium: 9.6 mg/dL (ref 8.4–10.5)
Chloride: 106 mEq/L (ref 96–112)
Creatinine, Ser: 2.11 mg/dL — ABNORMAL HIGH (ref 0.40–1.50)
GFR: 27.52 mL/min — ABNORMAL LOW (ref >60.00)
Glucose, Bld: 184 mg/dL — ABNORMAL HIGH (ref 70–99)
Potassium: 5.1 mEq/L (ref 3.5–5.1)
Sodium: 140 mEq/L (ref 135–145)
Total Bilirubin: 0.4 mg/dL (ref 0.2–1.2)
Total Protein: 6.6 g/dL (ref 6.0–8.3)

## 2023-01-14 NOTE — Progress Notes (Signed)
No labs ordered for this lab visit, but placed order for Cmp per Dr. Mariel Aloe lab result note on 11/15/2022. Potassium is up again. He should take Kayexylate 15 ml daily send him in a 30 day supply with 1 ref. Recheck cmp in 2-3 weeks

## 2023-01-22 NOTE — Progress Notes (Signed)
HPI: FU CAD. Admitted 11/16 with NSTEMI; cath revealed 40 RCA, 90 D1, 90 mild LAD, 95 distal LAD, 90 OM 2; had PCI of mid LAD with DES. Echocardiogram January 2022 showed normal LV function, mild left ventricular hypertrophy, grade 1 diastolic dysfunction, mild mitral regurgitation.  Since last seen patient denies dyspnea, chest pain, palpitations or syncope.  Current Outpatient Medications  Medication Sig Dispense Refill   aspirin EC 81 MG tablet Take 4 tablets (325 mg total) by mouth daily. (Patient taking differently: Take 81 mg by mouth daily.)     Cholecalciferol (VITAMIN D) 50 MCG (2000 UT) tablet Take 2,000 Units by mouth daily.     Coenzyme Q10 (COQ10) 100 MG CAPS Take 100 mg by mouth daily.     doxazosin (CARDURA) 8 MG tablet TAKE 1 TABLET AT BEDTIME 90 tablet 3   ferrous gluconate (FERGON) 240 (27 FE) MG tablet Take 240 mg by mouth daily.     finasteride (PROSCAR) 5 MG tablet TAKE 1 TABLET EVERY DAY 90 tablet 3   glimepiride (AMARYL) 4 MG tablet TAKE 1 TABLET EVERY DAY BEFORE BREAKFAST 90 tablet 3   metFORMIN (GLUCOPHAGE) 500 MG tablet Take 1 tablet (500 mg total) by mouth daily with breakfast. 90 tablet 1   metoprolol tartrate (LOPRESSOR) 25 MG tablet Take 0.5 tablets (12.5 mg total) by mouth 2 (two) times daily. 90 tablet 1   omeprazole (PRILOSEC) 40 MG capsule TAKE 1 CAPSULE EVERY DAY 90 capsule 3   pioglitazone (ACTOS) 30 MG tablet Take 1 tablet (30 mg total) by mouth daily. 90 tablet 2   rosuvastatin (CRESTOR) 5 MG tablet TAKE 1 TABLET EVERY DAY 90 tablet 3   traMADol (ULTRAM) 50 MG tablet TAKE 1 TABLET EVERY 12 HOURS AS NEEDED FOR PAIN 20 tablet 0   zolpidem (AMBIEN) 10 MG tablet Take 1 tablet (10 mg total) by mouth at bedtime as needed. for sleep 90 tablet 1   chlorhexidine (PERIDEX) 0.12 % solution After brushing, rinse with one capful for one minute twice a day then spit out (Patient not taking: Reported on 01/30/2023) 473 mL 0   imiquimod (ALDARA) 5 % cream Apply  topically every day as tolerated for 6 weeks. (Patient not taking: Reported on 01/30/2023) 12 each 3   Multiple Vitamin (MULTIVITAMIN WITH MINERALS) TABS tablet Take 1 tablet by mouth daily. (Patient not taking: Reported on 01/30/2023)     nitroGLYCERIN (NITROSTAT) 0.4 MG SL tablet PLACE 1 TABLET UNDER THE TONGUE EVERY 5 MINUTES AS NEEDED FOR CHESTPAIN (Patient not taking: Reported on 05/17/2022) 25 tablet 3   sodium polystyrene (KAYEXALATE) 15 GM/60ML suspension Take 60 milliliters (15 grams total) by mouth daily for 4 days. (Patient not taking: Reported on 01/30/2023) 240 mL 0   No current facility-administered medications for this visit.     Past Medical History:  Diagnosis Date   Allergic rhinitis    Anemia    BPH (benign prostatic hyperplasia)    With obstruction/lower urinary tract symptoms. Takes cardura for prostate.    CAD (coronary artery disease)    a. 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    Past  Surgical History:  Procedure Laterality Date   APPENDECTOMY     BACK SURGERY     vertebral collapse in lower back had insturment, reports no metal   CARDIAC CATHETERIZATION N/A 05/02/2015   Procedure: Left Heart Cath and Coronary Angiography;  Surgeon: Runell Gess, MD;  Location: [facility] INVASIVE CV LAB;  Service: Cardiovascular;  Laterality: N/A;   CARDIAC CATHETERIZATION N/A 05/02/2015   Procedure: Coronary Stent Intervention;  Surgeon: Runell Gess, MD;  Location: [facility] INVASIVE CV LAB;  Service: Cardiovascular;  Laterality: N/A;   CORONARY ANGIOPLASTY WITH STENT PLACEMENT     JOINT REPLACEMENT Right    6 surgeries including total shoulder replacement roughly in 2000   MOHS SURGERY     TOTAL SHOULDER  REPLACEMENT     TOTAL SHOULDER REVISION Right 04/20/2021   Procedure: Removal of infected right shoulder arthroplasty, implantation of cement spacer;  Surgeon: Francena Hanly, MD;  Location: [facility] ORS;  Service: Orthopedics;  Laterality: Right;    Social History   Socioeconomic History   Marital status: Married    Spouse name: Not on file   Number of children: 1   Years of education: Not on file   Highest education level: Not on file  Occupational History   Occupation: Retired  Tobacco Use   Smoking status: Never    Passive exposure: Never   Smokeless tobacco: Never  Vaping Use   Vaping status: Never Used  Substance and Sexual Activity   Alcohol use: No   Drug use: No   Sexual activity: Not on file  Other Topics Concern   Not on file  Social History Narrative   Lives in [place], [place] with wife. No pets. No dietary restrictions      Retired from truck driving, long haul   No cigarettes, alcohol to excess   Seat belts every time.    Social Determinants of Health   Financial Resource Strain: Low Risk  (07/24/2021)   Overall Financial Resource Strain (CARDIA)    Difficulty of Paying Living Expenses: Not hard at all  Food Insecurity: No Food Insecurity (07/25/2022)   Hunger Vital Sign    Worried About Running Out of Food in the Last Year: Never true    Ran Out of Food in the Last Year: Never true  Transportation Needs: No Transportation Needs (07/25/2022)   PRAPARE - Administrator, Civil Service (Medical): No    Lack of Transportation (Non-Medical): No  Physical Activity: Inactive (07/24/2021)   Exercise Vital Sign    Days of Exercise per Week: 0 days    Minutes of Exercise per Session: 0 min  Stress: No Stress Concern Present (07/24/2021)   [employer] of Occupational Health - Occupational Stress Questionnaire    Feeling of Stress : Not at all  Social Connections: Moderately Integrated (07/24/2021)   Social Connection and Isolation Panel [NHANES]     Frequency of Communication with Friends and Family: More than three times a week    Frequency of Social Gatherings with Friends and Family: More than three times a week    Attends Religious Services: More than 4 times per year    Active Member of [employer] or Organizations: No    Attends Banker Meetings: Never    Marital Status: Married  Catering manager Violence: Not At Risk (07/25/2022)   Humiliation, Afraid, Rape, and Kick questionnaire    Fear of Current or Ex-Partner: No    Emotionally Abused: No    Physically  Abused: No    Sexually Abused: No    Family History  Problem Relation Age of Onset   Heart disease Mother        chf   Heart attack Father    CAD Father    Colitis Sister    Other Brother        head trauma   Heart disease Brother    Hyperlipidemia Brother    Hypertension Brother    Diabetes Brother    Kidney failure Brother    Diabetes Son    Kidney failure Maternal Aunt     ROS: no fevers or chills, productive cough, hemoptysis, dysphasia, odynophagia, melena, hematochezia, dysuria, hematuria, rash, seizure activity, orthopnea, PND, pedal edema, claudication. Remaining systems are negative.  Physical Exam: Well-developed well-nourished in no acute distress.  Skin is warm and dry.  HEENT is normal.  Neck is supple.  Chest is clear to auscultation with normal expansion.  Cardiovascular exam is regular rate and rhythm.  Abdominal exam nontender or distended. No masses palpated. Extremities show no edema. neuro grossly intact  EKG Interpretation Date/Time:  Wednesday January 30 2023 09:54:51 EDT Ventricular Rate:  52 PR Interval:  224 QRS Duration:  132 QT Interval:  418 QTC Calculation: 388 R Axis:   -40  Text Interpretation: Sinus bradycardia with 1st degree A-V block Left axis deviation Right bundle branch block Inferior infarct , age undetermined When compared with ECG of 03-May-2015 04:17, PR interval has increased QRS axis Shifted left  Criteria for Anteroseptal infarct are no longer Present Inferior infarct is now Present T wave inversion no longer evident in Anterolateral leads Confirmed by Olga Millers ([id number]) on 01/30/2023 9:56:52 AM    A/P  1 coronary artery disease-patient denies chest pain.  Continue aspirin and statin.  2 hypertension-blood pressure controlled.  Continue present medical regimen.  3 hyperlipidemia-continue Crestor.  Note he did not tolerate higher doses previously.  Olga Millers, MD

## 2023-01-30 ENCOUNTER — Encounter: Payer: Self-pay | Admitting: Cardiology

## 2023-01-30 ENCOUNTER — Ambulatory Visit: Payer: Medicare HMO | Attending: Cardiology | Admitting: Cardiology

## 2023-01-30 VITALS — BP 128/75 | HR 52 | Ht 65.0 in | Wt 155.0 lb

## 2023-01-30 DIAGNOSIS — I2510 Atherosclerotic heart disease of native coronary artery without angina pectoris: Secondary | ICD-10-CM | POA: Diagnosis not present

## 2023-01-30 DIAGNOSIS — I10 Essential (primary) hypertension: Secondary | ICD-10-CM

## 2023-01-30 DIAGNOSIS — E7800 Pure hypercholesterolemia, unspecified: Secondary | ICD-10-CM | POA: Diagnosis not present

## 2023-01-30 NOTE — Patient Instructions (Signed)

## 2023-02-08 ENCOUNTER — Other Ambulatory Visit (HOSPITAL_BASED_OUTPATIENT_CLINIC_OR_DEPARTMENT_OTHER): Payer: Self-pay

## 2023-02-13 ENCOUNTER — Other Ambulatory Visit (HOSPITAL_BASED_OUTPATIENT_CLINIC_OR_DEPARTMENT_OTHER): Payer: Self-pay

## 2023-03-18 DIAGNOSIS — D692 Other nonthrombocytopenic purpura: Secondary | ICD-10-CM | POA: Diagnosis not present

## 2023-03-18 DIAGNOSIS — L821 Other seborrheic keratosis: Secondary | ICD-10-CM | POA: Diagnosis not present

## 2023-03-18 DIAGNOSIS — D1801 Hemangioma of skin and subcutaneous tissue: Secondary | ICD-10-CM | POA: Diagnosis not present

## 2023-03-18 DIAGNOSIS — Z85828 Personal history of other malignant neoplasm of skin: Secondary | ICD-10-CM | POA: Diagnosis not present

## 2023-03-18 DIAGNOSIS — L570 Actinic keratosis: Secondary | ICD-10-CM | POA: Diagnosis not present

## 2023-03-22 DIAGNOSIS — N1832 Chronic kidney disease, stage 3b: Secondary | ICD-10-CM | POA: Diagnosis not present

## 2023-03-27 ENCOUNTER — Other Ambulatory Visit (HOSPITAL_BASED_OUTPATIENT_CLINIC_OR_DEPARTMENT_OTHER): Payer: Self-pay

## 2023-03-27 MED ORDER — INFLUENZA VAC A&B SURF ANT ADJ 0.5 ML IM SUSY
0.5000 mL | PREFILLED_SYRINGE | Freq: Once | INTRAMUSCULAR | 0 refills | Status: AC
  Filled 2023-03-27: qty 0.5, 1d supply, fill #0

## 2023-04-01 ENCOUNTER — Other Ambulatory Visit (HOSPITAL_BASED_OUTPATIENT_CLINIC_OR_DEPARTMENT_OTHER): Payer: Self-pay

## 2023-04-01 ENCOUNTER — Telehealth: Payer: Self-pay | Admitting: Family Medicine

## 2023-04-01 ENCOUNTER — Other Ambulatory Visit: Payer: Self-pay

## 2023-04-01 DIAGNOSIS — E875 Hyperkalemia: Secondary | ICD-10-CM

## 2023-04-01 MED ORDER — SPS (SODIUM POLYSTYRENE SULF) 15 GM/60ML CO SUSP
15.0000 g | Freq: Every day | 0 refills | Status: DC
Start: 2023-04-01 — End: 2023-09-16
  Filled 2023-04-01: qty 240, 4d supply, fill #0

## 2023-04-01 NOTE — Telephone Encounter (Signed)
Pt said his kidney doctor said his potassium is low and he wanted to know if Dr. Abner Greenspan can call him something in. He uses [facility].

## 2023-04-01 NOTE — Telephone Encounter (Signed)
Refill sent.

## 2023-04-01 NOTE — Telephone Encounter (Signed)
[facility] Night - Client Client Site [facility] - Night Provider Danise Edge - MD Contact Type Call Who Is Calling Patient / Member / Family / Caregiver Caller Name Myrene Galas Phone Number [phone] Patient Name Hayden Garza Patient DOB 1933-03-14 Call Type Message Only Information Provided Reason for Call Medication Question / Request Initial Comment Caller states they want medication called in for high potassium and has an appointment Thursday. Disp. Time Disposition Final User 04/01/2023 7:46:24 AM General Information Provided Yes Lois Huxley Call Closed By: Lois Huxley Transaction Date/Time: 04/01/2023 7:44:20 AM (ET)

## 2023-04-02 ENCOUNTER — Other Ambulatory Visit (HOSPITAL_BASED_OUTPATIENT_CLINIC_OR_DEPARTMENT_OTHER): Payer: Self-pay

## 2023-04-04 DIAGNOSIS — D631 Anemia in chronic kidney disease: Secondary | ICD-10-CM | POA: Diagnosis not present

## 2023-04-04 DIAGNOSIS — I129 Hypertensive chronic kidney disease with stage 1 through stage 4 chronic kidney disease, or unspecified chronic kidney disease: Secondary | ICD-10-CM | POA: Diagnosis not present

## 2023-04-04 DIAGNOSIS — E1122 Type 2 diabetes mellitus with diabetic chronic kidney disease: Secondary | ICD-10-CM | POA: Diagnosis not present

## 2023-04-04 DIAGNOSIS — N1832 Chronic kidney disease, stage 3b: Secondary | ICD-10-CM | POA: Diagnosis not present

## 2023-04-04 DIAGNOSIS — E875 Hyperkalemia: Secondary | ICD-10-CM | POA: Diagnosis not present

## 2023-04-22 DIAGNOSIS — M9904 Segmental and somatic dysfunction of sacral region: Secondary | ICD-10-CM | POA: Diagnosis not present

## 2023-04-22 DIAGNOSIS — M9903 Segmental and somatic dysfunction of lumbar region: Secondary | ICD-10-CM | POA: Diagnosis not present

## 2023-04-22 DIAGNOSIS — M4726 Other spondylosis with radiculopathy, lumbar region: Secondary | ICD-10-CM | POA: Diagnosis not present

## 2023-04-22 DIAGNOSIS — M4728 Other spondylosis with radiculopathy, sacral and sacrococcygeal region: Secondary | ICD-10-CM | POA: Diagnosis not present

## 2023-04-23 DIAGNOSIS — M4726 Other spondylosis with radiculopathy, lumbar region: Secondary | ICD-10-CM | POA: Diagnosis not present

## 2023-04-23 DIAGNOSIS — M4728 Other spondylosis with radiculopathy, sacral and sacrococcygeal region: Secondary | ICD-10-CM | POA: Diagnosis not present

## 2023-04-23 DIAGNOSIS — M9903 Segmental and somatic dysfunction of lumbar region: Secondary | ICD-10-CM | POA: Diagnosis not present

## 2023-04-23 DIAGNOSIS — M9904 Segmental and somatic dysfunction of sacral region: Secondary | ICD-10-CM | POA: Diagnosis not present

## 2023-04-24 DIAGNOSIS — M4726 Other spondylosis with radiculopathy, lumbar region: Secondary | ICD-10-CM | POA: Diagnosis not present

## 2023-04-24 DIAGNOSIS — M9904 Segmental and somatic dysfunction of sacral region: Secondary | ICD-10-CM | POA: Diagnosis not present

## 2023-04-24 DIAGNOSIS — M4728 Other spondylosis with radiculopathy, sacral and sacrococcygeal region: Secondary | ICD-10-CM | POA: Diagnosis not present

## 2023-04-24 DIAGNOSIS — M9903 Segmental and somatic dysfunction of lumbar region: Secondary | ICD-10-CM | POA: Diagnosis not present

## 2023-04-25 ENCOUNTER — Other Ambulatory Visit: Payer: Self-pay | Admitting: Family Medicine

## 2023-04-25 DIAGNOSIS — E119 Type 2 diabetes mellitus without complications: Secondary | ICD-10-CM

## 2023-04-29 DIAGNOSIS — M9903 Segmental and somatic dysfunction of lumbar region: Secondary | ICD-10-CM | POA: Diagnosis not present

## 2023-04-29 DIAGNOSIS — M4726 Other spondylosis with radiculopathy, lumbar region: Secondary | ICD-10-CM | POA: Diagnosis not present

## 2023-04-29 DIAGNOSIS — M4728 Other spondylosis with radiculopathy, sacral and sacrococcygeal region: Secondary | ICD-10-CM | POA: Diagnosis not present

## 2023-04-29 DIAGNOSIS — M9904 Segmental and somatic dysfunction of sacral region: Secondary | ICD-10-CM | POA: Diagnosis not present

## 2023-04-30 DIAGNOSIS — M9903 Segmental and somatic dysfunction of lumbar region: Secondary | ICD-10-CM | POA: Diagnosis not present

## 2023-04-30 DIAGNOSIS — M4726 Other spondylosis with radiculopathy, lumbar region: Secondary | ICD-10-CM | POA: Diagnosis not present

## 2023-04-30 DIAGNOSIS — M9904 Segmental and somatic dysfunction of sacral region: Secondary | ICD-10-CM | POA: Diagnosis not present

## 2023-04-30 DIAGNOSIS — M4728 Other spondylosis with radiculopathy, sacral and sacrococcygeal region: Secondary | ICD-10-CM | POA: Diagnosis not present

## 2023-05-01 DIAGNOSIS — M4726 Other spondylosis with radiculopathy, lumbar region: Secondary | ICD-10-CM | POA: Diagnosis not present

## 2023-05-01 DIAGNOSIS — M9903 Segmental and somatic dysfunction of lumbar region: Secondary | ICD-10-CM | POA: Diagnosis not present

## 2023-05-01 DIAGNOSIS — M9904 Segmental and somatic dysfunction of sacral region: Secondary | ICD-10-CM | POA: Diagnosis not present

## 2023-05-01 DIAGNOSIS — M4728 Other spondylosis with radiculopathy, sacral and sacrococcygeal region: Secondary | ICD-10-CM | POA: Diagnosis not present

## 2023-05-06 DIAGNOSIS — M4726 Other spondylosis with radiculopathy, lumbar region: Secondary | ICD-10-CM | POA: Diagnosis not present

## 2023-05-06 DIAGNOSIS — M9903 Segmental and somatic dysfunction of lumbar region: Secondary | ICD-10-CM | POA: Diagnosis not present

## 2023-05-06 DIAGNOSIS — M4728 Other spondylosis with radiculopathy, sacral and sacrococcygeal region: Secondary | ICD-10-CM | POA: Diagnosis not present

## 2023-05-06 DIAGNOSIS — M9904 Segmental and somatic dysfunction of sacral region: Secondary | ICD-10-CM | POA: Diagnosis not present

## 2023-05-08 DIAGNOSIS — M9904 Segmental and somatic dysfunction of sacral region: Secondary | ICD-10-CM | POA: Diagnosis not present

## 2023-05-08 DIAGNOSIS — M9903 Segmental and somatic dysfunction of lumbar region: Secondary | ICD-10-CM | POA: Diagnosis not present

## 2023-05-08 DIAGNOSIS — M4726 Other spondylosis with radiculopathy, lumbar region: Secondary | ICD-10-CM | POA: Diagnosis not present

## 2023-05-08 DIAGNOSIS — M4728 Other spondylosis with radiculopathy, sacral and sacrococcygeal region: Secondary | ICD-10-CM | POA: Diagnosis not present

## 2023-05-13 DIAGNOSIS — M9903 Segmental and somatic dysfunction of lumbar region: Secondary | ICD-10-CM | POA: Diagnosis not present

## 2023-05-13 DIAGNOSIS — M9904 Segmental and somatic dysfunction of sacral region: Secondary | ICD-10-CM | POA: Diagnosis not present

## 2023-05-13 DIAGNOSIS — M4728 Other spondylosis with radiculopathy, sacral and sacrococcygeal region: Secondary | ICD-10-CM | POA: Diagnosis not present

## 2023-05-13 DIAGNOSIS — M4726 Other spondylosis with radiculopathy, lumbar region: Secondary | ICD-10-CM | POA: Diagnosis not present

## 2023-05-14 DIAGNOSIS — M9903 Segmental and somatic dysfunction of lumbar region: Secondary | ICD-10-CM | POA: Diagnosis not present

## 2023-05-14 DIAGNOSIS — M4728 Other spondylosis with radiculopathy, sacral and sacrococcygeal region: Secondary | ICD-10-CM | POA: Diagnosis not present

## 2023-05-14 DIAGNOSIS — M9904 Segmental and somatic dysfunction of sacral region: Secondary | ICD-10-CM | POA: Diagnosis not present

## 2023-05-14 DIAGNOSIS — M4726 Other spondylosis with radiculopathy, lumbar region: Secondary | ICD-10-CM | POA: Diagnosis not present

## 2023-05-15 DIAGNOSIS — M4726 Other spondylosis with radiculopathy, lumbar region: Secondary | ICD-10-CM | POA: Diagnosis not present

## 2023-05-15 DIAGNOSIS — M9904 Segmental and somatic dysfunction of sacral region: Secondary | ICD-10-CM | POA: Diagnosis not present

## 2023-05-15 DIAGNOSIS — M4728 Other spondylosis with radiculopathy, sacral and sacrococcygeal region: Secondary | ICD-10-CM | POA: Diagnosis not present

## 2023-05-15 DIAGNOSIS — M9903 Segmental and somatic dysfunction of lumbar region: Secondary | ICD-10-CM | POA: Diagnosis not present

## 2023-05-16 DIAGNOSIS — M9903 Segmental and somatic dysfunction of lumbar region: Secondary | ICD-10-CM | POA: Diagnosis not present

## 2023-05-16 DIAGNOSIS — M4728 Other spondylosis with radiculopathy, sacral and sacrococcygeal region: Secondary | ICD-10-CM | POA: Diagnosis not present

## 2023-05-16 DIAGNOSIS — M9904 Segmental and somatic dysfunction of sacral region: Secondary | ICD-10-CM | POA: Diagnosis not present

## 2023-05-16 DIAGNOSIS — M4726 Other spondylosis with radiculopathy, lumbar region: Secondary | ICD-10-CM | POA: Diagnosis not present

## 2023-05-30 DIAGNOSIS — Z0100 Encounter for examination of eyes and vision without abnormal findings: Secondary | ICD-10-CM | POA: Diagnosis not present

## 2023-05-30 LAB — HM DIABETES EYE EXAM

## 2023-06-08 ENCOUNTER — Other Ambulatory Visit: Payer: Self-pay | Admitting: Family Medicine

## 2023-06-10 DIAGNOSIS — M25562 Pain in left knee: Secondary | ICD-10-CM | POA: Diagnosis not present

## 2023-06-17 ENCOUNTER — Other Ambulatory Visit: Payer: Self-pay | Admitting: Family Medicine

## 2023-06-26 ENCOUNTER — Other Ambulatory Visit: Payer: Self-pay | Admitting: Family Medicine

## 2023-06-26 NOTE — Telephone Encounter (Signed)
 Requesting: Ambien  10mg   Contract: 04/11/22 UDS: 04/11/22 Last Visit: 10/15/22 Next Visit: None Last Refill: 11/27/22 #90 and 1RF  Please Advise

## 2023-07-01 ENCOUNTER — Telehealth (INDEPENDENT_AMBULATORY_CARE_PROVIDER_SITE_OTHER): Payer: Medicare HMO | Admitting: Family Medicine

## 2023-07-01 ENCOUNTER — Encounter: Payer: Self-pay | Admitting: Family Medicine

## 2023-07-01 VITALS — Ht 65.0 in | Wt 158.0 lb

## 2023-07-01 DIAGNOSIS — D649 Anemia, unspecified: Secondary | ICD-10-CM

## 2023-07-01 DIAGNOSIS — E611 Iron deficiency: Secondary | ICD-10-CM

## 2023-07-01 DIAGNOSIS — Z79899 Other long term (current) drug therapy: Secondary | ICD-10-CM | POA: Diagnosis not present

## 2023-07-01 DIAGNOSIS — R7989 Other specified abnormal findings of blood chemistry: Secondary | ICD-10-CM | POA: Diagnosis not present

## 2023-07-01 DIAGNOSIS — N189 Chronic kidney disease, unspecified: Secondary | ICD-10-CM

## 2023-07-01 DIAGNOSIS — E7800 Pure hypercholesterolemia, unspecified: Secondary | ICD-10-CM | POA: Diagnosis not present

## 2023-07-01 DIAGNOSIS — E119 Type 2 diabetes mellitus without complications: Secondary | ICD-10-CM

## 2023-07-01 DIAGNOSIS — G4700 Insomnia, unspecified: Secondary | ICD-10-CM

## 2023-07-01 DIAGNOSIS — I10 Essential (primary) hypertension: Secondary | ICD-10-CM | POA: Diagnosis not present

## 2023-07-01 DIAGNOSIS — Z0000 Encounter for general adult medical examination without abnormal findings: Secondary | ICD-10-CM

## 2023-07-01 NOTE — Assessment & Plan Note (Signed)
Taking iron supplement daily. -Check iron levels.

## 2023-07-01 NOTE — Progress Notes (Signed)
Does not have mychart - will need call  Needs refill of Ambien - no other issues  Is due for contract/UDS  Labs pended.

## 2023-07-01 NOTE — Assessment & Plan Note (Signed)
Daily use of Ambien (Zolpidem) with no reported side effects. Request for refill - PCP sending.

## 2023-07-01 NOTE — Assessment & Plan Note (Signed)
Blood glucose levels are well-controlled with current regimen, with readings around 100 per patient. No reported side effects from medications. -Continue current medication regimen. -Order labs Lab Results  Component Value Date   HGBA1C 7.2 (H) 10/15/2022

## 2023-07-01 NOTE — Progress Notes (Signed)
Virtual Video Visit via MyChart Note - changed to audio only  I connected with  Hayden Garza on 07/01/23 at  9:00 AM EST by the video enabled telemedicine application for MyChart, and verified that I am speaking with the correct person using two identifiers.   I introduced myself as a Publishing rights manager with the practice. We discussed the limitations of evaluation and management by telemedicine and the availability of in person appointments. The patient expressed understanding and agreed to proceed.  Participating parties in this visit include: The patient and the nurse practitioner listed.   The patient is: At home I am: at home   Provider with audio/video capabilities, but patient unable to get video. We will do an audio-only visit.   Subjective:    CC:  Chief Complaint  Patient presents with   Medical Management of Chronic Issues    HPI: Hayden Garza is a 89 y.o. year old male presenting today via MyChart today for routine follow-up.     Discussed the use of AI scribe software for clinical note transcription with the patient, who gave verbal consent to proceed.  History of Present Illness   The patient, with a history of diabetes, hypertension, and hyperlipidemia, has been managing well with his current medication regimen, reporting no side effects. He has been regularly monitoring his blood glucose levels, which are consistently around 100. He denies any symptoms of high or low blood pressure such as dizziness, lightheadedness, headaches, or chest pain.  The patient is also on medication for gastroesophageal reflux disease and insomnia, requiring nightly use of Ambien. He reports no side effects from this medication and requests a refill.  He has been under the care of a cardiologist and a nephrologist, with an upcoming appointment with the latter in May.  The patient is also on iron supplementation, taken once daily. He reports no issues with this regimen.              Past medical history, Surgical history, Family history not pertinant except as noted below, Social history, Allergies, and medications have been entered into the medical record, reviewed, and corrections made.   Review of Systems:  All review of systems negative except what is listed in the HPI   Objective:    General:  Speaking clearly in complete sentences. Absent shortness of breath noted.   Alert and oriented x3.   Normal judgment.  Absent acute distress.   Impression and Recommendations:    Problem List Items Addressed This Visit       Active Problems   Diabetes mellitus type 2, noninsulin dependent (HCC) - Primary   Blood glucose levels are well-controlled with current regimen, with readings around 100 per patient. No reported side effects from medications. -Continue current medication regimen. -Order labs Lab Results  Component Value Date   HGBA1C 7.2 (H) 10/15/2022         Relevant Orders   Hemoglobin A1c   Comprehensive metabolic panel   Microalbumin / creatinine urine ratio   Hyperlipidemia   -Continue current medication regimen and healthy lifestyle choices.       Relevant Orders   Lipid panel   Essential hypertension   No reported symptoms of high or low blood pressure. Recommend occasional home monitoring. -Continue current medication regimen.      Relevant Orders   Comprehensive metabolic panel   Anemia   Taking iron supplement daily. -Check iron levels.      Relevant Orders   CBC with  Differential/Platelet   Insomnia   Daily use of Ambien (Zolpidem) with no reported side effects. Request for refill - PCP sending.      Preventative health care   Relevant Orders   Hemoglobin A1c   CBC with Differential/Platelet   Comprehensive metabolic panel   Lipid panel   TSH   Microalbumin / creatinine urine ratio   CRI (chronic renal insufficiency)   Relevant Orders   Comprehensive metabolic panel   Other Visit Diagnoses        High risk medication use       Relevant Orders   Drug Monitoring Panel [phone] , Urine     Iron deficiency       Relevant Orders   IBC + Ferritin        Routine PCP follow-up in 3 months.   Follow-up if symptoms worsen or fail to improve.    I discussed the assessment and treatment plan with the patient. The patient was provided an opportunity to ask questions and all were answered. The patient agreed with the plan and demonstrated an understanding of the instructions.   The patient was advised to call back or seek an in-person evaluation if the symptoms worsen or if the condition fails to improve as anticipated.   Hayden Dana, NP

## 2023-07-01 NOTE — Assessment & Plan Note (Signed)
No reported symptoms of high or low blood pressure. Recommend occasional home monitoring. -Continue current medication regimen.

## 2023-07-01 NOTE — Assessment & Plan Note (Signed)
-  Continue current medication regimen and healthy lifestyle choices.

## 2023-07-02 ENCOUNTER — Other Ambulatory Visit (INDEPENDENT_AMBULATORY_CARE_PROVIDER_SITE_OTHER): Payer: Medicare HMO

## 2023-07-02 ENCOUNTER — Other Ambulatory Visit: Payer: Medicare HMO

## 2023-07-02 ENCOUNTER — Ambulatory Visit (INDEPENDENT_AMBULATORY_CARE_PROVIDER_SITE_OTHER): Payer: Medicare HMO

## 2023-07-02 DIAGNOSIS — N189 Chronic kidney disease, unspecified: Secondary | ICD-10-CM | POA: Diagnosis not present

## 2023-07-02 DIAGNOSIS — I10 Essential (primary) hypertension: Secondary | ICD-10-CM

## 2023-07-02 DIAGNOSIS — D649 Anemia, unspecified: Secondary | ICD-10-CM | POA: Diagnosis not present

## 2023-07-02 DIAGNOSIS — Z0000 Encounter for general adult medical examination without abnormal findings: Secondary | ICD-10-CM | POA: Diagnosis not present

## 2023-07-02 DIAGNOSIS — R7989 Other specified abnormal findings of blood chemistry: Secondary | ICD-10-CM

## 2023-07-02 DIAGNOSIS — E7800 Pure hypercholesterolemia, unspecified: Secondary | ICD-10-CM | POA: Diagnosis not present

## 2023-07-02 DIAGNOSIS — E119 Type 2 diabetes mellitus without complications: Secondary | ICD-10-CM | POA: Diagnosis not present

## 2023-07-02 DIAGNOSIS — E611 Iron deficiency: Secondary | ICD-10-CM | POA: Diagnosis not present

## 2023-07-02 DIAGNOSIS — Z79899 Other long term (current) drug therapy: Secondary | ICD-10-CM

## 2023-07-02 LAB — CBC WITH DIFFERENTIAL/PLATELET
Basophils Absolute: 0.1 10*3/uL (ref 0.0–0.1)
Basophils Relative: 0.5 % (ref 0.0–3.0)
Eosinophils Absolute: 0.3 10*3/uL (ref 0.0–0.7)
Eosinophils Relative: 2.9 % (ref 0.0–5.0)
HCT: 35.6 % — ABNORMAL LOW (ref 39.0–52.0)
Hemoglobin: 12 g/dL — ABNORMAL LOW (ref 13.0–17.0)
Lymphocytes Relative: 32.8 % (ref 12.0–46.0)
Lymphs Abs: 3.4 10*3/uL (ref 0.7–4.0)
MCHC: 33.7 g/dL (ref 30.0–36.0)
MCV: 98.1 fL (ref 78.0–100.0)
Monocytes Absolute: 1 10*3/uL (ref 0.1–1.0)
Monocytes Relative: 9.3 % (ref 3.0–12.0)
Neutro Abs: 5.6 10*3/uL (ref 1.4–7.7)
Neutrophils Relative %: 54.5 % (ref 43.0–77.0)
Platelets: 218 10*3/uL (ref 150.0–400.0)
RBC: 3.63 Mil/uL — ABNORMAL LOW (ref 4.22–5.81)
RDW: 13.2 % (ref 11.5–15.5)
WBC: 10.3 10*3/uL (ref 4.0–10.5)

## 2023-07-02 LAB — IBC + FERRITIN
Ferritin: 104.4 ng/mL (ref 22.0–322.0)
Iron: 80 ug/dL (ref 42–165)
Saturation Ratios: 29 % (ref 20.0–50.0)
TIBC: 275.8 ug/dL (ref 250.0–450.0)
Transferrin: 197 mg/dL — ABNORMAL LOW (ref 212.0–360.0)

## 2023-07-02 LAB — TSH: TSH: 6.37 u[IU]/mL — ABNORMAL HIGH (ref 0.35–5.50)

## 2023-07-02 LAB — HEMOGLOBIN A1C: Hgb A1c MFr Bld: 8.8 % — ABNORMAL HIGH (ref 4.6–6.5)

## 2023-07-02 LAB — MICROALBUMIN / CREATININE URINE RATIO
Creatinine,U: 161.7 mg/dL
Microalb Creat Ratio: 14.7 mg/g (ref 0.0–30.0)
Microalb, Ur: 23.7 mg/dL — ABNORMAL HIGH (ref 0.0–1.9)

## 2023-07-02 LAB — COMPREHENSIVE METABOLIC PANEL
ALT: 9 U/L (ref 0–53)
AST: 11 U/L (ref 0–37)
Albumin: 4.4 g/dL (ref 3.5–5.2)
Alkaline Phosphatase: 86 U/L (ref 39–117)
BUN: 29 mg/dL — ABNORMAL HIGH (ref 6–23)
CO2: 25 meq/L (ref 19–32)
Calcium: 9.4 mg/dL (ref 8.4–10.5)
Chloride: 105 meq/L (ref 96–112)
Creatinine, Ser: 2.1 mg/dL — ABNORMAL HIGH (ref 0.40–1.50)
GFR: 27.59 mL/min — ABNORMAL LOW (ref >60.00)
Glucose, Bld: 193 mg/dL — ABNORMAL HIGH (ref 70–99)
Potassium: 5 meq/L (ref 3.5–5.1)
Sodium: 142 meq/L (ref 135–145)
Total Bilirubin: 0.4 mg/dL (ref 0.2–1.2)
Total Protein: 7.3 g/dL (ref 6.0–8.3)

## 2023-07-02 LAB — LIPID PANEL
Cholesterol: 147 mg/dL (ref 0–200)
HDL: 42.3 mg/dL (ref >39.00)
LDL Cholesterol: 62 mg/dL (ref 0–99)
NonHDL: 105.05
Total CHOL/HDL Ratio: 3
Triglycerides: 213 mg/dL — ABNORMAL HIGH (ref 0.0–149.0)
VLDL: 42.6 mg/dL — ABNORMAL HIGH (ref 0.0–40.0)

## 2023-07-02 LAB — T4, FREE: Free T4: 0.92 ng/dL (ref 0.60–1.60)

## 2023-07-02 MED ORDER — PIOGLITAZONE HCL 45 MG PO TABS: 45.0000 mg | ORAL_TABLET | Freq: Every day | ORAL | 1 refills | Status: AC

## 2023-07-02 NOTE — Addendum Note (Signed)
Addended by: Hyman Hopes B on: 07/02/2023 01:52 PM   Modules accepted: Orders

## 2023-07-03 ENCOUNTER — Telehealth: Payer: Self-pay | Admitting: Neurology

## 2023-07-03 NOTE — Telephone Encounter (Signed)
Patient needs 3 month follow up appt scheduled with Dr. Abner Greenspan. He wanted to get an appt for him and his wife together and I could not find anything. Please call patient to schedule.

## 2023-07-05 LAB — DRUG MONITORING PANEL 376104, URINE
Amphetamines: NEGATIVE ng/mL (ref <500)
Barbiturates: NEGATIVE ng/mL (ref <300)
Benzodiazepines: NEGATIVE ng/mL (ref <100)
Cocaine Metabolite: NEGATIVE ng/mL (ref <150)
Desmethyltramadol: NEGATIVE ng/mL (ref <100)
Opiates: NEGATIVE ng/mL (ref <100)
Oxycodone: NEGATIVE ng/mL (ref <100)
Tramadol: NEGATIVE ng/mL (ref <100)

## 2023-07-05 LAB — DM TEMPLATE

## 2023-07-10 ENCOUNTER — Telehealth: Payer: Self-pay | Admitting: Family Medicine

## 2023-07-10 NOTE — Telephone Encounter (Signed)
Copied from CRM [id number]. Topic: Medicare AWV >> Jul 10, 2023 10:28 AM Payton Doughty wrote: Reason for CRM: Called LVM 07/10/2023 to schedule AWV. Please schedule Virtual or Telehealth visits ONLY.   Verlee Rossetti; Care Guide Ambulatory Clinical Support [place] [facility] Direct Dial: [phone]

## 2023-07-12 ENCOUNTER — Other Ambulatory Visit: Payer: Self-pay | Admitting: Family Medicine

## 2023-07-12 DIAGNOSIS — E119 Type 2 diabetes mellitus without complications: Secondary | ICD-10-CM

## 2023-08-02 ENCOUNTER — Ambulatory Visit: Payer: Medicare HMO

## 2023-08-02 DIAGNOSIS — Z0000 Encounter for general adult medical examination without abnormal findings: Secondary | ICD-10-CM | POA: Diagnosis not present

## 2023-08-02 NOTE — Progress Notes (Signed)
 Subjective:   [facility]den [facility] is a 89 y.o. male who presents for Medicare Annual/Subsequent preventive examination.  Visit Complete: Virtual I connected with  Hayden [facility] on 08/02/23 by a audio enabled telemedicine application and verified that I am speaking with the correct person using two identifiers.  Patient Location: Home  Provider Location: Office/Clinic  I discussed the limitations of evaluation and management by telemedicine. The patient expressed understanding and agreed to proceed.  Vital Signs: Because this visit was a virtual/telehealth visit, some criteria may be missing or patient reported. Any vitals not documented were not able to be obtained and vitals that have been documented are patient reported.   Cardiac Risk Factors include: advanced age (>105men, >14 women);diabetes mellitus;dyslipidemia;hypertension;male gender     Objective:    Today's Vitals   08/02/23 1343  PainSc: 6    There is no height or weight on file to calculate BMI.     08/02/2023    1:43 PM 07/25/2022    8:53 AM 07/24/2021   11:45 AM 04/20/2021   11:00 PM 04/30/2015   11:25 AM 11/30/2011   12:53 PM  Advanced Directives  Does Patient Have a Medical Advance Directive? Yes Yes Yes Yes No Patient has advance directive, copy not in chart  Type of Advance Directive Healthcare Power of [place];Living will Healthcare Power of [place];Living will Healthcare Power of [place];Living will Healthcare Power of [place];Living will  Healthcare Power of Attorney  Does patient want to make changes to medical advance directive? No - Patient declined No - Patient declined  No - Patient declined    Copy of Healthcare Power of Attorney in Chart? Yes - validated most recent copy scanned in chart (See row information) Yes - validated most recent copy scanned in chart (See row information) No - copy requested No - copy requested    Would patient like information on creating a medical advance directive?     No -  patient declined information   Pre-existing out of facility DNR order (yellow form or pink MOST form)      No    Current Medications (verified) Outpatient Encounter Medications as of 08/02/2023  Medication Sig   aspirin EC 81 MG tablet Take 4 tablets (325 mg total) by mouth daily. (Patient taking differently: Take 81 mg by mouth daily.)   Cholecalciferol (VITAMIN D) 50 MCG (2000 UT) tablet Take 2,000 Units by mouth daily.   Coenzyme Q10 (COQ10) 100 MG CAPS Take 100 mg by mouth daily.   doxazosin (CARDURA) 8 MG tablet TAKE 1 TABLET AT BEDTIME   ferrous gluconate (FERGON) 240 (27 FE) MG tablet Take 240 mg by mouth daily.   finasteride (PROSCAR) 5 MG tablet Take 1 tablet (5 mg total) by mouth daily.   glimepiride (AMARYL) 4 MG tablet Take 1 tablet (4 mg total) by mouth daily with breakfast.   imiquimod (ALDARA) 5 % cream Apply topically every day as tolerated for 6 weeks.   metFORMIN (GLUCOPHAGE) 500 MG tablet Take 1 tablet (500 mg total) by mouth daily with breakfast.   metoprolol tartrate (LOPRESSOR) 25 MG tablet Take 0.5 tablets (12.5 mg total) by mouth 2 (two) times daily. Needs appt   Multiple Vitamin (MULTIVITAMIN WITH MINERALS) TABS tablet Take 1 tablet by mouth daily.   nitroGLYCERIN (NITROSTAT) 0.4 MG SL tablet PLACE 1 TABLET UNDER THE TONGUE EVERY 5 MINUTES AS NEEDED FOR CHESTPAIN (Patient not taking: Reported on 05/17/2022)   omeprazole (PRILOSEC) 40 MG capsule TAKE 1 CAPSULE EVERY  DAY   pioglitazone (ACTOS) 45 MG tablet Take 1 tablet (45 mg total) by mouth daily.   rosuvastatin (CRESTOR) 5 MG tablet TAKE 1 TABLET EVERY DAY   sodium polystyrene (SPS, SODIUM POLYSTYRENE SULF,) 15 GM/60ML suspension Take 60 milliliters (15 grams total) by mouth daily for 4 days.   traMADol (ULTRAM) 50 MG tablet TAKE 1 TABLET EVERY 12 HOURS AS NEEDED FOR PAIN   zolpidem (AMBIEN) 10 MG tablet TAKE 1 TABLET AT BEDTIME AS NEEDED FOR SLEEP   No facility-administered encounter medications on file as of  08/02/2023.    Allergies (verified) Lipitor [atorvastatin] and Penicillins   History: Past Medical History:  Diagnosis Date   Allergic rhinitis    Anemia    BPH (benign prostatic hyperplasia)    With obstruction/lower urinary tract symptoms. Takes cardura for prostate.    CAD (coronary artery disease)    a. 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   Past Surgical History:  Procedure Laterality Date   APPENDECTOMY     BACK SURGERY     vertebral collapse in lower back had insturment, reports no metal   CARDIAC CATHETERIZATION N/A 05/02/2015   Procedure: Left Heart Cath and Coronary Angiography;  Surgeon: Runell Gess, MD;  Location: [facility] INVASIVE CV LAB;  Service: Cardiovascular;  Laterality: N/A;   CARDIAC CATHETERIZATION N/A 05/02/2015   Procedure: Coronary Stent Intervention;  Surgeon: Runell Gess, MD;  Location: [facility];  Service: Cardiovascular;  Laterality: N/A;   CORONARY ANGIOPLASTY WITH STENT PLACEMENT     JOINT REPLACEMENT Right    6 surgeries including total shoulder replacement roughly in 2000   MOHS SURGERY     TOTAL SHOULDER REPLACEMENT     TOTAL SHOULDER REVISION Right 04/20/2021   Procedure: Removal of infected right shoulder arthroplasty, implantation of cement spacer;  Surgeon: Francena Hanly, MD;  Location: [facility] ORS;  Service: Orthopedics;  Laterality: Right;   Family History  Problem Relation Age of Onset   Heart disease Mother        chf   Heart attack Father    CAD Father    Colitis Sister    Other Brother        head trauma   Heart disease Brother    Hyperlipidemia Brother    Hypertension Brother    Diabetes Brother    Kidney failure Brother    Diabetes Son     Kidney failure Maternal Aunt    Social History   Socioeconomic History   Marital status: Married    Spouse name: Not on file   Number of children: 1   Years of education: Not on file   Highest education level: Not on file  Occupational History   Occupation: Retired  Tobacco Use   Smoking status: Never    Passive exposure: Never   Smokeless tobacco: Never  Vaping Use   Vaping status: Never Used  Substance and Sexual Activity   Alcohol use: No   Drug use: No   Sexual activity: Not on file  Other Topics Concern   Not on file  Social History Narrative   Lives in [place], [place]  with wife. No pets. No dietary restrictions      Retired from truck driving, long haul   No cigarettes, alcohol to excess   Seat belts every time.    Social Drivers of Corporate investment banker Strain: Low Risk  (08/02/2023)   Overall Financial Resource Strain (CARDIA)    Difficulty of Paying Living Expenses: Not hard at all  Food Insecurity: No Food Insecurity (08/02/2023)   Hunger Vital Sign    Worried About Running Out of Food in the Last Year: Never true    Ran Out of Food in the Last Year: Never true  Transportation Needs: No Transportation Needs (08/02/2023)   PRAPARE - Administrator, Civil Service (Medical): No    Lack of Transportation (Non-Medical): No  Physical Activity: Inactive (08/02/2023)   Exercise Vital Sign    Days of Exercise per Week: 0 days    Minutes of Exercise per Session: 0 min  Stress: No Stress Concern Present (08/02/2023)   [employer] of Occupational Health - Occupational Stress Questionnaire    Feeling of Stress : Not at all  Social Connections: Moderately Integrated (08/02/2023)   Social Connection and Isolation Panel [NHANES]    Frequency of Communication with Friends and Family: More than three times a week    Frequency of Social Gatherings with Friends and Family: Three times a week    Attends Religious Services: More than 4 times per year     Active Member of Clubs or Organizations: No    Attends Banker Meetings: Never    Marital Status: Married    Tobacco Counseling Counseling given: Not Answered   Clinical Intake:  Pre-visit preparation completed: Yes  Pain : 0-10 Pain Score: 6  Pain Type: Chronic pain Pain Location: Back Pain Orientation: Lower Pain Descriptors / Indicators: Constant, Sore Pain Onset: More than a month ago Pain Frequency: Constant  Diabetes: Yes CBG done?: No Did pt. bring in CBG monitor from home?: No  How often do you need to have someone help you when you read instructions, pamphlets, or other written materials from your doctor or pharmacy?: 1 - Never  Interpreter Needed?: No  Information entered by :: Donne Anon, CMA   Activities of Daily Living    08/02/2023    1:45 PM  In your present state of health, do you have any difficulty performing the following activities:  Hearing? 0  Vision? 0  Difficulty concentrating or making decisions? 0  Walking or climbing stairs? 0  Dressing or bathing? 0  Doing errands, shopping? 0  Preparing Food and eating ? N  Using the Toilet? N  In the past six months, have you accidently leaked urine? Y  Do you have problems with loss of bowel control? N  Managing your Medications? N  Managing your Finances? N  Housekeeping or managing your Housekeeping? N    Patient Care Team: [name], MD as PCP - General (Family Medicine)  Indicate any recent Medical Services you may have received from other than [facility] providers in the past year (date may be approximate).     Assessment:   This is a routine wellness examination for [place].  Hearing/Vision screen No results found.   Goals Addressed   None    Depression Screen    08/02/2023    1:55 PM 10/15/2022    8:16 AM 09/04/2022    9:17 AM 07/25/2022    9:03 AM 05/17/2022    9:04 AM 04/10/2022  9:24 AM 09/28/2021    8:59 AM  PHQ 2/9 Scores  PHQ - 2 Score 0 0 0 0 0 0 0   PHQ- 9 Score  0    0     Fall Risk    08/02/2023    1:51 PM 10/15/2022    8:15 AM 09/04/2022    9:17 AM 07/25/2022    9:01 AM 05/17/2022    9:03 AM  Fall Risk   Falls in the past year? 1 0 0 0 0  Number falls in past yr: 0 0 0 0   Injury with Fall? 0 0 0 0   Risk for fall due to : History of fall(s)   No Fall Risks No Fall Risks  Follow up Falls evaluation completed Falls evaluation completed  Falls evaluation completed Falls evaluation completed    MEDICARE RISK AT HOME: Medicare Risk at Home Any stairs in or around the home?: Yes If so, are there any without handrails?: No Home free of loose throw rugs in walkways, pet beds, electrical cords, etc?: Yes Adequate lighting in your home to reduce risk of falls?: Yes Life alert?: No Use of a cane, walker or w/c?: No Grab bars in the bathroom?: No Shower chair or bench in shower?: No Elevated toilet seat or a handicapped toilet?: No  TIMED UP AND GO:  Was the test performed?  No    Cognitive Function:        08/02/2023    1:56 PM 07/25/2022    9:09 AM  6CIT Screen  What Year? 0 points 0 points  What month? 0 points 0 points  What time? 0 points 0 points  Count back from 20 0 points 0 points  Months in reverse 2 points 2 points  Repeat phrase 0 points 8 points  Total Score 2 points 10 points    Immunizations Immunization History  Administered Date(s) Administered   Fluad Quad(high Dose 65+) 03/29/2021, 04/10/2022   Fluad Trivalent(High Dose 65+) 03/27/2023   H1N1 06/08/2008   Influenza Split 03/08/2008, 03/02/2009, 02/22/2010, 03/01/2011   Influenza, High Dose Seasonal PF 03/28/2012, 04/02/2013, 03/05/2014, 03/08/2015, 02/27/2016, 03/13/2017, 03/07/2018, 02/19/2019, 03/16/2020   PFIZER(Purple Top)SARS-COV-2 Vaccination 07/01/2019, 07/22/2019, 03/16/2020   Pneumococcal Conjugate-13 03/05/2014   Pneumococcal Polysaccharide-23 04/21/2001, 11/09/2016   Respiratory Syncytial Virus Vaccine,Recomb Aduvanted(Arexvy)  05/09/2022   Td 01/26/2005   Tdap 12/22/2020   Zoster Recombinant(Shingrix) 04/11/2022, 10/15/2022   Zoster, Live 03/26/2007    TDAP status: Up to date  Flu Vaccine status: Up to date  Pneumococcal vaccine status: Up to date  Covid-19 vaccine status: Information provided on how to obtain vaccines.   Qualifies for Shingles Vaccine? Yes   Zostavax completed Yes   Shingrix Completed?: Yes  Screening Tests Health Maintenance  Topic Date Due   FOOT EXAM  Never done   COVID-19 Vaccine (4 - 2024-25 season) 02/10/2023   Medicare Annual Wellness (AWV)  07/26/2023   HEMOGLOBIN A1C  12/30/2023   OPHTHALMOLOGY EXAM  05/29/2024   DTaP/Tdap/Td (3 - Td or Tdap) 12/23/2030   Pneumonia Vaccine 79+ Years old  Completed   INFLUENZA VACCINE  Completed   Zoster Vaccines- Shingrix  Completed   HPV VACCINES  Aged Out    Health Maintenance  Health Maintenance Due  Topic Date Due   FOOT EXAM  Never done   COVID-19 Vaccine (4 - 2024-25 season) 02/10/2023   Medicare Annual Wellness (AWV)  07/26/2023    Colorectal cancer screening: No longer required.   Lung  Cancer Screening: (Low Dose CT Chest recommended if Age 65-80 years, 20 pack-year currently smoking OR have quit w/in 15years.) does not qualify.   Additional Screening:  Hepatitis C Screening: does not qualify  Vision Screening: Recommended annual ophthalmology exams for early detection of glaucoma and other disorders of the eye. Is the patient up to date with their annual eye exam?  Yes  Who is the provider or what is the name of the office in which the patient attends annual eye exams? Can't remember the name at this time If pt is not established with a provider, would they like to be referred to a provider to establish care? No .   Dental Screening: Recommended annual dental exams for proper oral hygiene  Diabetic Foot Exam: Diabetic Foot Exam: Overdue, Pt has been advised about the importance in completing this exam. Pt is  scheduled for diabetic foot exam on N/a.  Community Resource Referral / Chronic Care Management: CRR required this visit?  No   CCM required this visit?  No     Plan:     I have personally reviewed and noted the following in the patient's chart:   Medical and social history Use of alcohol, tobacco or illicit drugs  Current medications and supplements including opioid prescriptions. Patient is not currently taking opioid prescriptions. Functional ability and status Nutritional status Physical activity Advanced directives List of other physicians Hospitalizations, surgeries, and ER visits in previous 12 months Vitals Screenings to include cognitive, depression, and falls Referrals and appointments  In addition, I have reviewed and discussed with patient certain preventive protocols, quality metrics, and best practice recommendations. A written personalized care plan for preventive services as well as general preventive health recommendations were provided to patient.     Donne Anon, CMA   08/02/2023   After Visit Summary: (Declined) Due to this being a telephonic visit, with patients personalized plan was offered to patient but patient Declined AVS at this time   Nurse Notes: None

## 2023-08-02 NOTE — Patient Instructions (Signed)
 [name] , Thank you for taking time to come for your Medicare Wellness Visit. I appreciate your ongoing commitment to your health goals. Please review the following plan we discussed and let me know if I can assist you in the future.   These are the goals we discussed:  Goals      Patient Stated     Maintain health & independence        This is a list of the screening recommended for you and due dates:  Health Maintenance  Topic Date Due   Complete foot exam   Never done   COVID-19 Vaccine (4 - 2024-25 season) 02/10/2023   Hemoglobin A1C  12/30/2023   Eye exam for diabetics  05/29/2024   Medicare Annual Wellness Visit  08/01/2024   DTaP/Tdap/Td vaccine (3 - Td or Tdap) 12/23/2030   Pneumonia Vaccine  Completed   Flu Shot  Completed   Zoster (Shingles) Vaccine  Completed   HPV Vaccine  Aged Out     Next appointment: Follow up in one year for your annual wellness visit.   Preventive Care 62 Years and Older, Male Preventive care refers to lifestyle choices and visits with your health care provider that can promote health and wellness. What does preventive care include? A yearly physical exam. This is also called an annual well check. Dental exams once or twice a year. Routine eye exams. Ask your health care provider how often you should have your eyes checked. Personal lifestyle choices, including: Daily care of your teeth and gums. Regular physical activity. Eating a healthy diet. Avoiding tobacco and drug use. Limiting alcohol use. Practicing safe sex. Taking low doses of aspirin every day. Taking vitamin and mineral supplements as recommended by your health care provider. What happens during an annual well check? The services and screenings done by your health care provider during your annual well check will depend on your age, overall health, lifestyle risk factors, and family history of disease. Counseling  Your health care provider may ask you questions about  your: Alcohol use. Tobacco use. Drug use. Emotional well-being. Home and relationship well-being. Sexual activity. Eating habits. History of falls. Memory and ability to understand (cognition). Work and work Astronomer. Screening  You may have the following tests or measurements: Height, weight, and BMI. Blood pressure. Lipid and cholesterol levels. These may be checked every 5 years, or more frequently if you are over 1 years old. Skin check. Lung cancer screening. You may have this screening every year starting at age 30 if you have a 30-pack-year history of smoking and currently smoke or have quit within the past 15 years. Fecal occult blood test (FOBT) of the stool. You may have this test every year starting at age 39. Flexible sigmoidoscopy or colonoscopy. You may have a sigmoidoscopy every 5 years or a colonoscopy every 10 years starting at age 49. Prostate cancer screening. Recommendations will vary depending on your family history and other risks. Hepatitis C blood test. Hepatitis B blood test. Sexually transmitted disease (STD) testing. Diabetes screening. This is done by checking your blood sugar (glucose) after you have not eaten for a while (fasting). You may have this done every 1-3 years. Abdominal aortic aneurysm (AAA) screening. You may need this if you are a current or former smoker. Osteoporosis. You may be screened starting at age 57 if you are at high risk. Talk with your health care provider about your test results, treatment options, and if necessary, the need for  more tests. Vaccines  Your health care provider may recommend certain vaccines, such as: Influenza vaccine. This is recommended every year. Tetanus, diphtheria, and acellular pertussis (Tdap, Td) vaccine. You may need a Td booster every 10 years. Zoster vaccine. You may need this after age 72. Pneumococcal 13-valent conjugate (PCV13) vaccine. One dose is recommended after age 65. Pneumococcal  polysaccharide (PPSV23) vaccine. One dose is recommended after age 28. Talk to your health care provider about which screenings and vaccines you need and how often you need them. This information is not intended to replace advice given to you by your health care provider. Make sure you discuss any questions you have with your health care provider. Document Released: 06/24/2015 Document Revised: 02/15/2016 Document Reviewed: 03/29/2015 Elsevier Interactive Patient Education  2017 [employer].  Fall Prevention in the Home Falls can cause injuries. They can happen to people of all ages. There are many things you can do to make your home safe and to help prevent falls. What can I do on the outside of my home? Regularly fix the edges of walkways and driveways and fix any cracks. Remove anything that might make you trip as you walk through a door, such as a raised step or threshold. Trim any bushes or trees on the path to your home. Use bright outdoor lighting. Clear any walking paths of anything that might make someone trip, such as rocks or tools. Regularly check to see if handrails are loose or broken. Make sure that both sides of any steps have handrails. Any raised decks and porches should have guardrails on the edges. Have any leaves, snow, or ice cleared regularly. Use sand or salt on walking paths during winter. Clean up any spills in your garage right away. This includes oil or grease spills. What can I do in the bathroom? Use night lights. Install grab bars by the toilet and in the tub and shower. Do not use towel bars as grab bars. Use non-skid mats or decals in the tub or shower. If you need to sit down in the shower, use a plastic, non-slip stool. Keep the floor dry. Clean up any water that spills on the floor as soon as it happens. Remove soap buildup in the tub or shower regularly. Attach bath mats securely with double-sided non-slip rug tape. Do not have throw rugs and other  things on the floor that can make you trip. What can I do in the bedroom? Use night lights. Make sure that you have a light by your bed that is easy to reach. Do not use any sheets or blankets that are too big for your bed. They should not hang down onto the floor. Have a firm chair that has side arms. You can use this for support while you get dressed. Do not have throw rugs and other things on the floor that can make you trip. What can I do in the kitchen? Clean up any spills right away. Avoid walking on wet floors. Keep items that you use a lot in easy-to-reach places. If you need to reach something above you, use a strong step stool that has a grab bar. Keep electrical cords out of the way. Do not use floor polish or wax that makes floors slippery. If you must use wax, use non-skid floor wax. Do not have throw rugs and other things on the floor that can make you trip. What can I do with my stairs? Do not leave any items on the stairs. Make  sure that there are handrails on both sides of the stairs and use them. Fix handrails that are broken or loose. Make sure that handrails are as long as the stairways. Check any carpeting to make sure that it is firmly attached to the stairs. Fix any carpet that is loose or worn. Avoid having throw rugs at the top or bottom of the stairs. If you do have throw rugs, attach them to the floor with carpet tape. Make sure that you have a light switch at the top of the stairs and the bottom of the stairs. If you do not have them, ask someone to add them for you. What else can I do to help prevent falls? Wear shoes that: Do not have high heels. Have rubber bottoms. Are comfortable and fit you well. Are closed at the toe. Do not wear sandals. If you use a stepladder: Make sure that it is fully opened. Do not climb a closed stepladder. Make sure that both sides of the stepladder are locked into place. Ask someone to hold it for you, if possible. Clearly  mark and make sure that you can see: Any grab bars or handrails. First and last steps. Where the edge of each step is. Use tools that help you move around (mobility aids) if they are needed. These include: Canes. Walkers. Scooters. Crutches. Turn on the lights when you go into a dark area. Replace any light bulbs as soon as they burn out. Set up your furniture so you have a clear path. Avoid moving your furniture around. If any of your floors are uneven, fix them. If there are any pets around you, be aware of where they are. Review your medicines with your doctor. Some medicines can make you feel dizzy. This can increase your chance of falling. Ask your doctor what other things that you can do to help prevent falls. This information is not intended to replace advice given to you by your health care provider. Make sure you discuss any questions you have with your health care provider. Document Released: 03/24/2009 Document Revised: 11/03/2015 Document Reviewed: 07/02/2014 Elsevier Interactive Patient Education  2017 [employer].

## 2023-08-15 DIAGNOSIS — G8929 Other chronic pain: Secondary | ICD-10-CM | POA: Diagnosis not present

## 2023-08-15 DIAGNOSIS — M5441 Lumbago with sciatica, right side: Secondary | ICD-10-CM | POA: Diagnosis not present

## 2023-08-15 DIAGNOSIS — Z981 Arthrodesis status: Secondary | ICD-10-CM | POA: Diagnosis not present

## 2023-08-20 ENCOUNTER — Other Ambulatory Visit (HOSPITAL_BASED_OUTPATIENT_CLINIC_OR_DEPARTMENT_OTHER): Payer: Self-pay

## 2023-08-21 ENCOUNTER — Other Ambulatory Visit: Payer: Self-pay

## 2023-08-21 ENCOUNTER — Other Ambulatory Visit (HOSPITAL_BASED_OUTPATIENT_CLINIC_OR_DEPARTMENT_OTHER): Payer: Self-pay

## 2023-08-21 MED ORDER — TRAMADOL HCL 50 MG PO TABS
50.0000 mg | ORAL_TABLET | Freq: Four times a day (QID) | ORAL | 0 refills | Status: AC | PRN
  Filled 2023-08-21 (×2): qty 20, 5d supply, fill #0

## 2023-08-26 ENCOUNTER — Other Ambulatory Visit: Payer: Self-pay | Admitting: Family Medicine

## 2023-09-02 ENCOUNTER — Other Ambulatory Visit: Payer: Self-pay | Admitting: Family Medicine

## 2023-09-12 DIAGNOSIS — M5441 Lumbago with sciatica, right side: Secondary | ICD-10-CM | POA: Diagnosis not present

## 2023-09-16 ENCOUNTER — Encounter: Payer: Self-pay | Admitting: Family Medicine

## 2023-09-16 ENCOUNTER — Other Ambulatory Visit (HOSPITAL_BASED_OUTPATIENT_CLINIC_OR_DEPARTMENT_OTHER): Payer: Self-pay

## 2023-09-16 ENCOUNTER — Ambulatory Visit (INDEPENDENT_AMBULATORY_CARE_PROVIDER_SITE_OTHER): Admitting: Family Medicine

## 2023-09-16 VITALS — BP 125/48 | HR 64 | Ht 65.0 in | Wt 156.0 lb

## 2023-09-16 DIAGNOSIS — L0390 Cellulitis, unspecified: Secondary | ICD-10-CM | POA: Diagnosis not present

## 2023-09-16 MED ORDER — DOXYCYCLINE HYCLATE 100 MG PO TABS
100.0000 mg | ORAL_TABLET | Freq: Two times a day (BID) | ORAL | 0 refills | Status: AC
  Filled 2023-09-16: qty 14, 7d supply, fill #0

## 2023-09-16 NOTE — Progress Notes (Signed)
 Acute Office Visit  Subjective:     Patient ID: Hayden Garza, male    DOB: [birth year], 89 y.o.   MRN: [id number]  Chief Complaint  Patient presents with   Skin Problem    HPI Patient is in today for wound.   Discussed the use of AI scribe software for clinical note transcription with the patient, who gave verbal consent to proceed.  History of Present Illness Hayden Garza is an 89 year old male who presents with a leg injury that is not healing properly. He is accompanied by his daughter-in-law.  He injured his leg approximately one and a half to two weeks ago when he hit it on the tailgate of a trailer. Initially, he managed the wound by dressing it and applying Neosporin. However, in the last couple of days, he noticed increased redness and some pus formation, indicating a possible infection. He describes the pain as not too severe, stating it hurts some but not terrible, and notes that he has experienced worse pain in the past. There is minimal drainage from the wound when changing the dressing. The wound is described as having a kind of a hole in it and is slightly swollen around the area. Despite these symptoms, he does not find the wound particularly tender. His tetanus vaccination is up to date, having been administered within the last five years.            ROS All review of systems negative except what is listed in the HPI      Objective:    BP (!) 125/48 (BP Location: Left Arm)   Pulse 64   Ht 5\' 5"  (1.651 m)   Wt 156 lb (70.8 kg)   SpO2 100%   BMI 25.96 kg/m      Physical Exam Vitals reviewed.  Constitutional:      Appearance: Normal appearance.  Musculoskeletal:       Legs:     Comments: 3 abrasaions with some scabbing, purulence, and spreading erythema, slight edema surrounding, no areas of induration or fluctuance, no abscess   Skin:    General: Skin is warm and dry.  Neurological:     Mental Status: He is alert and oriented to person,  place, and time.  Psychiatric:        Mood and Affect: Mood normal.        Behavior: Behavior normal.        Thought Content: Thought content normal.        Judgment: Judgment normal.         No results found for any visits on 09/16/23.      Assessment & Plan:   Problem List Items Addressed This Visit   None Visit Diagnoses       Wound cellulitis    -  Primary   Relevant Medications   doxycycline (VIBRA-TABS) 100 MG tablet      Assessment & Plan Leg wound with possible cellulitis Wound shows signs of early cellulitis.  - Prescribe doxycycline. Advise to take with food and water. - Recommend probiotic to prevent gastrointestinal side effects. - Instruct to clean wound with warm soapy water, apply Neosporin, and cover with gauze twice daily. - Advise sun protection while on doxycycline. - Wrap wound before leaving clinic. - Instruct to return if no improvement after antibiotics.      Meds ordered this encounter  Medications   doxycycline (VIBRA-TABS) 100 MG tablet    Sig: Take 1 tablet (100  mg total) by mouth 2 (two) times daily for 7 days.    Dispense:  14 tablet    Refill:  0    Supervising Provider:   Danise Edge A [[id number]]    Return if symptoms worsen or fail to improve.  Clayborne Dana, NP

## 2023-09-17 ENCOUNTER — Other Ambulatory Visit (HOSPITAL_BASED_OUTPATIENT_CLINIC_OR_DEPARTMENT_OTHER): Payer: Self-pay

## 2023-09-18 ENCOUNTER — Other Ambulatory Visit (HOSPITAL_BASED_OUTPATIENT_CLINIC_OR_DEPARTMENT_OTHER): Payer: Self-pay

## 2023-09-20 ENCOUNTER — Other Ambulatory Visit (HOSPITAL_BASED_OUTPATIENT_CLINIC_OR_DEPARTMENT_OTHER): Payer: Self-pay

## 2023-09-23 DIAGNOSIS — D1801 Hemangioma of skin and subcutaneous tissue: Secondary | ICD-10-CM | POA: Diagnosis not present

## 2023-09-23 DIAGNOSIS — L821 Other seborrheic keratosis: Secondary | ICD-10-CM | POA: Diagnosis not present

## 2023-09-23 DIAGNOSIS — D485 Neoplasm of uncertain behavior of skin: Secondary | ICD-10-CM | POA: Diagnosis not present

## 2023-09-23 DIAGNOSIS — L570 Actinic keratosis: Secondary | ICD-10-CM | POA: Diagnosis not present

## 2023-09-23 DIAGNOSIS — C44319 Basal cell carcinoma of skin of other parts of face: Secondary | ICD-10-CM | POA: Diagnosis not present

## 2023-09-23 DIAGNOSIS — Z85828 Personal history of other malignant neoplasm of skin: Secondary | ICD-10-CM | POA: Diagnosis not present

## 2023-09-30 DIAGNOSIS — N1832 Chronic kidney disease, stage 3b: Secondary | ICD-10-CM | POA: Diagnosis not present

## 2023-10-03 ENCOUNTER — Other Ambulatory Visit: Payer: Self-pay | Admitting: Family Medicine

## 2023-10-03 MED ORDER — ZOLPIDEM TARTRATE 10 MG PO TABS: 10.0000 mg | ORAL_TABLET | Freq: Every evening | ORAL | 0 refills | Status: DC | PRN

## 2023-10-03 NOTE — Telephone Encounter (Signed)
 Requesting: Ambien  10mg   Contract: 04/25/22 UDS: 07/02/23 Last Visit: 09/16/23 w/ Carolynne Citron Next Visit: 11/28/23 Last Refill: 06/26/23 #90 and 0RF  Please Advise

## 2023-10-03 NOTE — Telephone Encounter (Unsigned)
 Copied from CRM [id number]. Topic: Clinical - Medication Refill >> Oct 03, 2023 11:53 AM Allyne Areola wrote: Most Recent Primary Care Visit:  Provider: Everlina Hock  Department: [facility]  Visit Type: OFFICE VISIT  Date: 09/16/2023  Medication: zolpidem  (AMBIEN ) 10 MG tablet   Has the patient contacted their pharmacy? No (Agent: If no, request that the patient contact the pharmacy for the refill. If patient does not wish to contact the pharmacy document the reason why and proceed with request.) (Agent: If yes, when and what did the pharmacy advise?)  Is this the correct pharmacy for this prescription? Yes If no, delete pharmacy and type the correct one.  This is the patient's preferred pharmacy:  [facility] - [place], [place] - [address] [place] [address] Phone: [phone] Fax: [phone]     Has the prescription been filled recently? No  Is the patient out of the medication? No  Has the patient been seen for an appointment in the last year OR does the patient have an upcoming appointment? Yes  Can we respond through MyChart? No  Agent: Please be advised that Rx refills may take up to 3 business days. We ask that you follow-up with your pharmacy.

## 2023-10-10 DIAGNOSIS — D631 Anemia in chronic kidney disease: Secondary | ICD-10-CM | POA: Diagnosis not present

## 2023-10-10 DIAGNOSIS — E875 Hyperkalemia: Secondary | ICD-10-CM | POA: Diagnosis not present

## 2023-10-10 DIAGNOSIS — I129 Hypertensive chronic kidney disease with stage 1 through stage 4 chronic kidney disease, or unspecified chronic kidney disease: Secondary | ICD-10-CM | POA: Diagnosis not present

## 2023-10-10 DIAGNOSIS — E1122 Type 2 diabetes mellitus with diabetic chronic kidney disease: Secondary | ICD-10-CM | POA: Diagnosis not present

## 2023-10-10 DIAGNOSIS — N1832 Chronic kidney disease, stage 3b: Secondary | ICD-10-CM | POA: Diagnosis not present

## 2023-10-10 DIAGNOSIS — N400 Enlarged prostate without lower urinary tract symptoms: Secondary | ICD-10-CM | POA: Diagnosis not present

## 2023-11-18 DIAGNOSIS — Z961 Presence of intraocular lens: Secondary | ICD-10-CM | POA: Diagnosis not present

## 2023-11-18 DIAGNOSIS — E119 Type 2 diabetes mellitus without complications: Secondary | ICD-10-CM | POA: Diagnosis not present

## 2023-11-18 DIAGNOSIS — H18513 Endothelial corneal dystrophy, bilateral: Secondary | ICD-10-CM | POA: Diagnosis not present

## 2023-11-18 DIAGNOSIS — D3132 Benign neoplasm of left choroid: Secondary | ICD-10-CM | POA: Diagnosis not present

## 2023-11-18 DIAGNOSIS — H40013 Open angle with borderline findings, low risk, bilateral: Secondary | ICD-10-CM | POA: Diagnosis not present

## 2023-11-22 ENCOUNTER — Other Ambulatory Visit: Payer: Self-pay | Admitting: Family Medicine

## 2023-11-27 NOTE — Assessment & Plan Note (Signed)
 Well controlled, no changes to meds. Encouraged heart healthy diet such as the DASH diet and exercise as tolerated.

## 2023-11-27 NOTE — Assessment & Plan Note (Signed)
 Encourage heart healthy diet such as MIND or DASH diet, increase exercise, avoid trans fats, simple carbohydrates and processed foods, consider a krill or fish or flaxseed oil cap daily. Tolerating Rosuvastatin

## 2023-11-27 NOTE — Assessment & Plan Note (Signed)
 hgba1c acceptable, minimize simple carbs. Increase exercise as tolerated. Continue current meds

## 2023-11-27 NOTE — Assessment & Plan Note (Signed)
 Hydrate and monitor

## 2023-11-28 ENCOUNTER — Encounter: Payer: Self-pay | Admitting: Family Medicine

## 2023-11-28 ENCOUNTER — Ambulatory Visit: Payer: Medicare HMO | Admitting: Family Medicine

## 2023-11-28 VITALS — BP 120/72 | HR 66 | Resp 16 | Ht 65.0 in | Wt 150.8 lb

## 2023-11-28 DIAGNOSIS — N189 Chronic kidney disease, unspecified: Secondary | ICD-10-CM

## 2023-11-28 DIAGNOSIS — Z7984 Long term (current) use of oral hypoglycemic drugs: Secondary | ICD-10-CM | POA: Diagnosis not present

## 2023-11-28 DIAGNOSIS — E7800 Pure hypercholesterolemia, unspecified: Secondary | ICD-10-CM

## 2023-11-28 DIAGNOSIS — I10 Essential (primary) hypertension: Secondary | ICD-10-CM

## 2023-11-28 DIAGNOSIS — E119 Type 2 diabetes mellitus without complications: Secondary | ICD-10-CM

## 2023-11-28 LAB — TSH: TSH: 3.84 u[IU]/mL (ref 0.35–5.50)

## 2023-11-28 LAB — MICROALBUMIN / CREATININE URINE RATIO
Creatinine,U: 174.6 mg/dL
Microalb Creat Ratio: 22.2 mg/g (ref 0.0–30.0)
Microalb, Ur: 3.9 mg/dL — ABNORMAL HIGH (ref 0.0–1.9)

## 2023-11-28 LAB — LIPID PANEL
Cholesterol: 143 mg/dL (ref 0–200)
HDL: 43.2 mg/dL (ref >39.00)
LDL Cholesterol: 62 mg/dL (ref 0–99)
NonHDL: 99.54
Total CHOL/HDL Ratio: 3
Triglycerides: 186 mg/dL — ABNORMAL HIGH (ref 0.0–149.0)
VLDL: 37.2 mg/dL (ref 0.0–40.0)

## 2023-11-28 LAB — COMPREHENSIVE METABOLIC PANEL WITH GFR
ALT: 9 U/L (ref 0–53)
AST: 10 U/L (ref 0–37)
Albumin: 4.2 g/dL (ref 3.5–5.2)
Alkaline Phosphatase: 66 U/L (ref 39–117)
BUN: 31 mg/dL — ABNORMAL HIGH (ref 6–23)
CO2: 27 meq/L (ref 19–32)
Calcium: 9.6 mg/dL (ref 8.4–10.5)
Chloride: 105 meq/L (ref 96–112)
Creatinine, Ser: 2.07 mg/dL — ABNORMAL HIGH (ref 0.40–1.50)
GFR: 27.99 mL/min — ABNORMAL LOW (ref >60.00)
Glucose, Bld: 191 mg/dL — ABNORMAL HIGH (ref 70–99)
Potassium: 4.6 meq/L (ref 3.5–5.1)
Sodium: 140 meq/L (ref 135–145)
Total Bilirubin: 0.4 mg/dL (ref 0.2–1.2)
Total Protein: 6.8 g/dL (ref 6.0–8.3)

## 2023-11-28 LAB — CBC WITH DIFFERENTIAL/PLATELET
Basophils Absolute: 0.1 10*3/uL (ref 0.0–0.1)
Basophils Relative: 0.7 % (ref 0.0–3.0)
Eosinophils Absolute: 0.2 10*3/uL (ref 0.0–0.7)
Eosinophils Relative: 2.8 % (ref 0.0–5.0)
HCT: 32.5 % — ABNORMAL LOW (ref 39.0–52.0)
Hemoglobin: 11.2 g/dL — ABNORMAL LOW (ref 13.0–17.0)
Lymphocytes Relative: 22.6 % (ref 12.0–46.0)
Lymphs Abs: 1.6 10*3/uL (ref 0.7–4.0)
MCHC: 34.6 g/dL (ref 30.0–36.0)
MCV: 97.3 fl (ref 78.0–100.0)
Monocytes Absolute: 0.7 10*3/uL (ref 0.1–1.0)
Monocytes Relative: 9.5 % (ref 3.0–12.0)
Neutro Abs: 4.6 10*3/uL (ref 1.4–7.7)
Neutrophils Relative %: 64.4 % (ref 43.0–77.0)
Platelets: 183 10*3/uL (ref 150.0–400.0)
RBC: 3.34 Mil/uL — ABNORMAL LOW (ref 4.22–5.81)
RDW: 13.7 % (ref 11.5–15.5)
WBC: 7.2 10*3/uL (ref 4.0–10.5)

## 2023-11-28 LAB — HEMOGLOBIN A1C: Hgb A1c MFr Bld: 8.6 % — ABNORMAL HIGH (ref 4.6–6.5)

## 2023-11-28 NOTE — Progress Notes (Signed)
 Subjective:    Patient ID: Hayden Garza, male    DOB: Jun 11, 1933, 89 y.o.   MRN: [id number]  Chief Complaint  Patient presents with   Medical Management of Chronic Issues    Patient presents today for a 5 month follow-up.   Quality Metric Gaps    Foot exam    HPI Discussed the use of AI scribe software for clinical note transcription with the patient, who gave verbal consent to proceed.  History of Present Illness Hayden Garza is an 89 year old male who presents for a routine follow-up visit.  He continues to use Zolpidem  (Ambien ) for insomnia, which remains effective without causing confusion or falls. Occasionally, he experiences nightmares, but they are not concerning to him.  He monitors his blood sugar levels, which are mostly in the low 120s, combining fasting and postprandial measurements. He attempts to maintain a diet higher in protein and lower in carbohydrates, though he has some difficulty adhering to this diet. He regularly consumes [place] nuts to help manage his blood sugar levels.  He follows up with nephrology every six months for kidney monitoring, with no significant changes in renal function noted.  He stays hydrated by drinking water  regularly, especially during hot weather, consuming two thermoses of water  daily. No recent emergency room visits, chest pain, or abdominal issues.    Past Medical History:  Diagnosis Date   Allergic rhinitis    Anemia    BPH (benign prostatic hyperplasia)    With obstruction/lower urinary tract symptoms. Takes cardura  for prostate.    CAD (coronary artery disease)    a. 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    Past Surgical History:  Procedure  Laterality Date   APPENDECTOMY     BACK SURGERY     vertebral collapse in lower back had insturment, reports no metal   CARDIAC CATHETERIZATION N/A 05/02/2015   Procedure: Left Heart Cath and Coronary Angiography;  Surgeon: Avanell Leigh, MD;  Location: [facility] INVASIVE CV LAB;  Service: Cardiovascular;  Laterality: N/A;   CARDIAC CATHETERIZATION N/A 05/02/2015   Procedure: Coronary Stent Intervention;  Surgeon: Avanell Leigh, MD;  Location: [facility] INVASIVE CV LAB;  Service: Cardiovascular;  Laterality: N/A;   CORONARY ANGIOPLASTY WITH STENT PLACEMENT     JOINT REPLACEMENT Right    6 surgeries including total shoulder replacement roughly in 2000   MOHS SURGERY     TOTAL SHOULDER REPLACEMENT     TOTAL SHOULDER REVISION Right 04/20/2021   Procedure: Removal of infected right shoulder arthroplasty, implantation of cement spacer;  Surgeon: Ellard Gunning, MD;  Location: [facility];  Service: Orthopedics;  Laterality: Right;    Family History  Problem Relation Age of Onset   Heart disease Mother        chf   Heart attack Father    CAD Father    Colitis Sister    Other Brother        head trauma   Heart disease Brother    Hyperlipidemia Brother    Hypertension Brother    Diabetes Brother    Kidney failure Brother    Diabetes Son  Kidney failure Maternal Aunt     Social History   Socioeconomic History   Marital status: Married    Spouse name: Not on file   Number of children: 1   Years of education: Not on file   Highest education level: Not on file  Occupational History   Occupation: Retired  Tobacco Use   Smoking status: Never    Passive exposure: Never   Smokeless tobacco: Never  Vaping Use   Vaping status: Never Used  Substance and Sexual Activity   Alcohol use: No   Drug use: No   Sexual activity: Not on file  Other Topics Concern   Not on file  Social History Narrative   Lives in [place], [place] with wife. No pets. No dietary restrictions      Retired from truck  driving, long haul   No cigarettes, alcohol to excess   Seat belts every time.    Social Drivers of Corporate investment banker Strain: Low Risk  (08/02/2023)   Overall Financial Resource Strain (CARDIA)    Difficulty of Paying Living Expenses: Not hard at all  Food Insecurity: No Food Insecurity (08/02/2023)   Hunger Vital Sign    Worried About Running Out of Food in the Last Year: Never true    Ran Out of Food in the Last Year: Never true  Transportation Needs: No Transportation Needs (08/02/2023)   PRAPARE - Administrator, Civil Service (Medical): No    Lack of Transportation (Non-Medical): No  Physical Activity: Inactive (08/02/2023)   Exercise Vital Sign    Days of Exercise per Week: 0 days    Minutes of Exercise per Session: 0 min  Stress: No Stress Concern Present (08/02/2023)   [employer] of Occupational Health - Occupational Stress Questionnaire    Feeling of Stress : Not at all  Social Connections: Moderately Integrated (08/02/2023)   Social Connection and Isolation Panel    Frequency of Communication with Friends and Family: More than three times a week    Frequency of Social Gatherings with Friends and Family: Three times a week    Attends Religious Services: More than 4 times per year    Active Member of Clubs or Organizations: No    Attends Banker Meetings: Never    Marital Status: Married  Catering manager Violence: Not At Risk (08/02/2023)   Humiliation, Afraid, Rape, and Kick questionnaire    Fear of Current or Ex-Partner: No    Emotionally Abused: No    Physically Abused: No    Sexually Abused: No    Outpatient Medications Prior to Visit  Medication Sig Dispense Refill   aspirin  EC 81 MG tablet Take 4 tablets (325 mg total) by mouth daily. (Patient taking differently: Take 81 mg by mouth daily.)     Cholecalciferol  (VITAMIN D ) 50 MCG (2000 UT) tablet Take 2,000 Units by mouth daily.     Coenzyme Q10 (COQ10) 100 MG CAPS Take 100  mg by mouth daily.     doxazosin  (CARDURA ) 8 MG tablet TAKE 1 TABLET AT BEDTIME 90 tablet 3   ferrous gluconate (FERGON) 240 (27 FE) MG tablet Take 240 mg by mouth daily.     finasteride  (PROSCAR ) 5 MG tablet TAKE 1 TABLET EVERY DAY 90 tablet 3   glimepiride  (AMARYL ) 4 MG tablet TAKE 1 TABLET EVERY DAY WITH BREAKFAST 90 tablet 3   imiquimod  (ALDARA ) 5 % cream Apply topically every day as tolerated for 6 weeks. 12  each 3   metFORMIN  (GLUCOPHAGE ) 500 MG tablet Take 1 tablet (500 mg total) by mouth daily with breakfast. 90 tablet 1   metoprolol  tartrate (LOPRESSOR ) 25 MG tablet Take 0.5 tablets (12.5 mg total) by mouth 2 (two) times daily. 90 tablet 1   Multiple Vitamin (MULTIVITAMIN WITH MINERALS) TABS tablet Take 1 tablet by mouth daily.     nitroGLYCERIN  (NITROSTAT ) 0.4 MG SL tablet PLACE 1 TABLET UNDER THE TONGUE EVERY 5 MINUTES AS NEEDED FOR CHESTPAIN 25 tablet 3   omeprazole  (PRILOSEC) 40 MG capsule TAKE 1 CAPSULE EVERY DAY 90 capsule 3   pioglitazone  (ACTOS ) 45 MG tablet Take 1 tablet (45 mg total) by mouth daily. 90 tablet 1   rosuvastatin  (CRESTOR ) 5 MG tablet Take 1 tablet (5 mg total) by mouth daily. 90 tablet 0   traMADol  (ULTRAM ) 50 MG tablet TAKE 1 TABLET EVERY 12 HOURS AS NEEDED FOR PAIN 20 tablet 0   traMADol  (ULTRAM ) 50 MG tablet Take 1 tablet (50 mg total) by mouth every 6 (six) hours as needed. 20 tablet 0   zolpidem  (AMBIEN ) 10 MG tablet Take 1 tablet (10 mg total) by mouth at bedtime as needed. for sleep 90 tablet 0   No facility-administered medications prior to visit.    Allergies  Allergen Reactions   Lipitor [Atorvastatin ]     Joint and muscle pain   Penicillins     swelling in joints    Review of Systems  Constitutional:  Negative for fever and malaise/fatigue.  HENT:  Negative for congestion.   Eyes:  Negative for blurred vision.  Respiratory:  Negative for shortness of breath.   Cardiovascular:  Negative for chest pain, palpitations and leg swelling.   Gastrointestinal:  Negative for abdominal pain, blood in stool and nausea.  Genitourinary:  Negative for dysuria and frequency.  Musculoskeletal:  Negative for falls.  Skin:  Negative for rash.  Neurological:  Negative for dizziness, loss of consciousness and headaches.  Endo/Heme/Allergies:  Negative for environmental allergies.  Psychiatric/Behavioral:  Negative for depression. The patient is not nervous/anxious.        Objective:    Physical Exam Vitals reviewed.  Constitutional:      Appearance: Normal appearance. He is not ill-appearing.  HENT:     Head: Normocephalic and atraumatic.     Nose: Nose normal.   Eyes:     Conjunctiva/sclera: Conjunctivae normal.    Cardiovascular:     Rate and Rhythm: Normal rate.     Pulses: Normal pulses.     Heart sounds: Normal heart sounds. No murmur heard. Pulmonary:     Effort: Pulmonary effort is normal.     Breath sounds: Normal breath sounds. No wheezing.  Abdominal:     Palpations: Abdomen is soft. There is no mass.     Tenderness: There is no abdominal tenderness.   Musculoskeletal:     Cervical back: Normal range of motion.     Right lower leg: No edema.     Left lower leg: No edema.   Skin:    General: Skin is warm and dry.   Neurological:     General: No focal deficit present.     Mental Status: He is alert and oriented to person, place, and time.   Psychiatric:        Mood and Affect: Mood normal.     BP 120/72   Pulse 66   Resp 16   Ht 5' 5 (1.651 m)   Wt 150 lb  12.8 oz (68.4 kg)   SpO2 96%   BMI 25.09 kg/m  Wt Readings from Last 3 Encounters:  11/28/23 150 lb 12.8 oz (68.4 kg)  09/16/23 156 lb (70.8 kg)  07/01/23 158 lb (71.7 kg)    Diabetic Foot Exam - Simple   No data filed    Lab Results  Component Value Date   WBC 10.3 07/02/2023   HGB 12.0 (L) 07/02/2023   HCT 35.6 (L) 07/02/2023   PLT 218.0 07/02/2023   GLUCOSE 193 (H) 07/02/2023   CHOL 147 07/02/2023   TRIG 213.0 (H) 07/02/2023    HDL 42.30 07/02/2023   LDLCALC 62 07/02/2023   ALT 9 07/02/2023   AST 11 07/02/2023   NA 142 07/02/2023   K 5.0 07/02/2023   CL 105 07/02/2023   CREATININE 2.10 (H) 07/02/2023   BUN 29 (H) 07/02/2023   CO2 25 07/02/2023   TSH 6.37 (H) 07/02/2023   INR 1.13 05/01/2015   HGBA1C 8.8 (H) 07/02/2023   MICROALBUR 23.7 (H) 07/02/2023    Lab Results  Component Value Date   TSH 6.37 (H) 07/02/2023   Lab Results  Component Value Date   WBC 10.3 07/02/2023   HGB 12.0 (L) 07/02/2023   HCT 35.6 (L) 07/02/2023   MCV 98.1 07/02/2023   PLT 218.0 07/02/2023   Lab Results  Component Value Date   NA 142 07/02/2023   K 5.0 07/02/2023   CO2 25 07/02/2023   GLUCOSE 193 (H) 07/02/2023   BUN 29 (H) 07/02/2023   CREATININE 2.10 (H) 07/02/2023   BILITOT 0.4 07/02/2023   ALKPHOS 86 07/02/2023   AST 11 07/02/2023   ALT 9 07/02/2023   PROT 7.3 07/02/2023   ALBUMIN  4.4 07/02/2023   CALCIUM  9.4 07/02/2023   ANIONGAP 10 04/20/2021   EGFR 37 (L) 05/17/2022   GFR 27.59 (L) 07/02/2023   Lab Results  Component Value Date   CHOL 147 07/02/2023   Lab Results  Component Value Date   HDL 42.30 07/02/2023   Lab Results  Component Value Date   LDLCALC 62 07/02/2023   Lab Results  Component Value Date   TRIG 213.0 (H) 07/02/2023   Lab Results  Component Value Date   CHOLHDL 3 07/02/2023   Lab Results  Component Value Date   HGBA1C 8.8 (H) 07/02/2023       Assessment & Plan:  Diabetes mellitus type 2, noninsulin dependent (HCC) Assessment & Plan: hgba1c acceptable, minimize simple carbs. Increase exercise as tolerated. Continue current meds.   Chronic renal impairment, unspecified CKD stage Assessment & Plan: Hydrate and monitor    Essential hypertension Assessment & Plan: Well controlled, no changes to meds. Encouraged heart healthy diet such as the DASH diet and exercise as tolerated.    Pure hypercholesterolemia Assessment & Plan: Encourage heart healthy diet such  as MIND or DASH diet, increase exercise, avoid trans fats, simple carbohydrates and processed foods, consider a krill or fish or flaxseed oil cap daily. Tolerating Rosuvastatin      Assessment and Plan Assessment & Plan Diabetes Mellitus Type 2 Blood glucose levels well-managed. Advised protein intake every 3-4 hours to prevent hypoglycemia and support muscle maintenance. Emphasized protein and water  intake, especially on hot days, to prevent dehydration and maintain stable glucose levels. - Continue current diabetes management plan. - Encourage protein intake every 3-4 hours. - Regularly monitor blood glucose levels.  Renal Insufficiency Renal function well-managed with nephrology follow-up. Emphasized regular hydration to prevent dehydration and protect renal function. -  Continue nephrology follow-up every six months. - Encourage regular hydration, drinking 5-10 ounces of water  every 1-2 hours.  Insomnia Uses Zolpidem  effectively without significant side effects. Occasionally experiences nightmares. Reviewed controlled substance agreement. - Complete controlled substance agreement for Zolpidem .     Randie Bustle, MD

## 2023-11-28 NOTE — Patient Instructions (Signed)

## 2023-11-29 ENCOUNTER — Ambulatory Visit: Payer: Self-pay | Admitting: Family Medicine

## 2023-11-29 DIAGNOSIS — D649 Anemia, unspecified: Secondary | ICD-10-CM

## 2023-11-30 ENCOUNTER — Other Ambulatory Visit: Payer: Self-pay | Admitting: Family Medicine

## 2023-12-01 ENCOUNTER — Other Ambulatory Visit: Payer: Self-pay | Admitting: Family Medicine

## 2023-12-01 DIAGNOSIS — E119 Type 2 diabetes mellitus without complications: Secondary | ICD-10-CM

## 2023-12-10 DIAGNOSIS — C44319 Basal cell carcinoma of skin of other parts of face: Secondary | ICD-10-CM | POA: Diagnosis not present

## 2023-12-11 DIAGNOSIS — C44319 Basal cell carcinoma of skin of other parts of face: Secondary | ICD-10-CM | POA: Diagnosis not present

## 2023-12-17 DIAGNOSIS — E119 Type 2 diabetes mellitus without complications: Secondary | ICD-10-CM | POA: Diagnosis not present

## 2023-12-25 DIAGNOSIS — E1165 Type 2 diabetes mellitus with hyperglycemia: Secondary | ICD-10-CM | POA: Diagnosis not present

## 2023-12-27 ENCOUNTER — Other Ambulatory Visit: Payer: Self-pay | Admitting: Family Medicine

## 2023-12-27 ENCOUNTER — Other Ambulatory Visit (HOSPITAL_BASED_OUTPATIENT_CLINIC_OR_DEPARTMENT_OTHER): Payer: Self-pay

## 2023-12-27 ENCOUNTER — Other Ambulatory Visit: Payer: Self-pay

## 2023-12-27 ENCOUNTER — Other Ambulatory Visit (INDEPENDENT_AMBULATORY_CARE_PROVIDER_SITE_OTHER)

## 2023-12-27 DIAGNOSIS — D649 Anemia, unspecified: Secondary | ICD-10-CM

## 2023-12-27 LAB — CBC WITH DIFFERENTIAL/PLATELET
Basophils Absolute: 0.1 K/uL (ref 0.0–0.1)
Basophils Relative: 0.6 % (ref 0.0–3.0)
Eosinophils Absolute: 0.2 K/uL (ref 0.0–0.7)
Eosinophils Relative: 2.1 % (ref 0.0–5.0)
HCT: 32.5 % — ABNORMAL LOW (ref 39.0–52.0)
Hemoglobin: 10.9 g/dL — ABNORMAL LOW (ref 13.0–17.0)
Lymphocytes Relative: 21.6 % (ref 12.0–46.0)
Lymphs Abs: 1.9 K/uL (ref 0.7–4.0)
MCHC: 33.7 g/dL (ref 30.0–36.0)
MCV: 99.1 fl (ref 78.0–100.0)
Monocytes Absolute: 0.8 K/uL (ref 0.1–1.0)
Monocytes Relative: 9.4 % (ref 3.0–12.0)
Neutro Abs: 5.9 K/uL (ref 1.4–7.7)
Neutrophils Relative %: 66.3 % (ref 43.0–77.0)
Platelets: 170 K/uL (ref 150.0–400.0)
RBC: 3.28 Mil/uL — ABNORMAL LOW (ref 4.22–5.81)
RDW: 13.5 % (ref 11.5–15.5)
WBC: 8.8 K/uL (ref 4.0–10.5)

## 2023-12-27 LAB — FECAL OCCULT BLOOD, IMMUNOCHEMICAL: Fecal Occult Bld: NEGATIVE

## 2023-12-27 NOTE — Addendum Note (Signed)
 Addended by: Kawana Hegel D on: 12/27/2023 10:31 AM   Modules accepted: Orders

## 2023-12-27 NOTE — Telephone Encounter (Signed)
 Medication: zolpidem  (AMBIEN ) 10 MG tablet   Has the patient contacted their pharmacy? No.   Preferred Pharmacy:  [facility] - [place], [place] - [address] [place] [address] Phone: [phone]  Fax: [phone]

## 2023-12-27 NOTE — Telephone Encounter (Signed)
 Requesting: Ambien  10mg   Contract: 04/25/22 UDS: 07/02/23 Last Visit: 11/28/23 Next Visit: 06/01/24 Last Refill: 10/03/23 #90 and 0RF   Please Advise

## 2023-12-28 MED ORDER — ZOLPIDEM TARTRATE 10 MG PO TABS: 10.0000 mg | ORAL_TABLET | Freq: Every evening | ORAL | 0 refills | Status: DC | PRN

## 2023-12-29 ENCOUNTER — Ambulatory Visit: Payer: Self-pay | Admitting: Family Medicine

## 2023-12-29 DIAGNOSIS — D649 Anemia, unspecified: Secondary | ICD-10-CM

## 2024-01-10 ENCOUNTER — Other Ambulatory Visit

## 2024-01-16 DIAGNOSIS — E119 Type 2 diabetes mellitus without complications: Secondary | ICD-10-CM | POA: Diagnosis not present

## 2024-01-22 ENCOUNTER — Other Ambulatory Visit (HOSPITAL_BASED_OUTPATIENT_CLINIC_OR_DEPARTMENT_OTHER): Payer: Self-pay

## 2024-01-22 DIAGNOSIS — I2510 Atherosclerotic heart disease of native coronary artery without angina pectoris: Secondary | ICD-10-CM | POA: Diagnosis not present

## 2024-01-22 DIAGNOSIS — E1165 Type 2 diabetes mellitus with hyperglycemia: Secondary | ICD-10-CM | POA: Diagnosis not present

## 2024-01-22 DIAGNOSIS — E785 Hyperlipidemia, unspecified: Secondary | ICD-10-CM | POA: Diagnosis not present

## 2024-01-22 MED ORDER — OZEMPIC (0.25 OR 0.5 MG/DOSE) 2 MG/3ML ~~LOC~~ SOPN
0.2500 mg | PEN_INJECTOR | SUBCUTANEOUS | 3 refills | Status: DC
  Filled 2024-01-22: qty 3, 56d supply, fill #0
  Filled 2024-03-25: qty 3, 56d supply, fill #1
  Filled 2024-05-11: qty 3, 56d supply, fill #2

## 2024-01-23 ENCOUNTER — Other Ambulatory Visit (HOSPITAL_BASED_OUTPATIENT_CLINIC_OR_DEPARTMENT_OTHER): Payer: Self-pay

## 2024-02-07 ENCOUNTER — Other Ambulatory Visit: Payer: Self-pay | Admitting: Family Medicine

## 2024-02-27 ENCOUNTER — Other Ambulatory Visit: Payer: Self-pay | Admitting: Family Medicine

## 2024-02-27 MED ORDER — ZOLPIDEM TARTRATE 10 MG PO TABS: 10.0000 mg | ORAL_TABLET | Freq: Every evening | ORAL | 0 refills | Status: DC | PRN

## 2024-02-27 NOTE — Telephone Encounter (Unsigned)
 Copied from CRM [id number]. Topic: Clinical - Medication Refill >> Feb 27, 2024  8:13 AM Berwyn MATSU wrote: Medication: zolpidem  (AMBIEN ) 10 MG tablet   Has the patient contacted their pharmacy? Yes (Agent: If no, request that the patient contact the pharmacy for the refill. If patient does not wish to contact the pharmacy document the reason why and proceed with request.) (Agent: If yes, when and what did the pharmacy advise?)  This is the patient's preferred pharmacy:  [facility] - [place], [place] - [address] [place] [address] Phone: [phone] Fax: [phone]    Is this the correct pharmacy for this prescription? Yes If no, delete pharmacy and type the correct one.   Has the prescription been filled recently? Yes  Is the patient out of the medication? Yes  Has the patient been seen for an appointment in the last year OR does the patient have an upcoming appointment? Yes  Can we respond through MyChart? No  Agent: Please be advised that Rx refills may take up to 3 business days. We ask that you follow-up with your pharmacy.

## 2024-02-27 NOTE — Telephone Encounter (Signed)
 Requesting: Ambien  10mg  Contract: 04/25/22 UDS: 07/02/23 Last Visit: 11/28/23  Next Visit: 06/01/24  Last Refill: 12/28/23 #90 and 0RF  Please Advise

## 2024-03-09 ENCOUNTER — Other Ambulatory Visit (HOSPITAL_BASED_OUTPATIENT_CLINIC_OR_DEPARTMENT_OTHER): Payer: Self-pay

## 2024-03-09 MED ORDER — FLUZONE HIGH-DOSE 0.5 ML IM SUSY
0.5000 mL | PREFILLED_SYRINGE | Freq: Once | INTRAMUSCULAR | 0 refills | Status: AC
Start: 2024-03-09 — End: 2024-03-10
  Filled 2024-03-09: qty 0.5, 1d supply, fill #0

## 2024-03-09 MED ORDER — COMIRNATY 30 MCG/0.3ML IM SUSY
0.3000 mL | PREFILLED_SYRINGE | Freq: Once | INTRAMUSCULAR | 0 refills | Status: AC
Start: 2024-03-09 — End: 2024-03-10
  Filled 2024-03-09: qty 0.3, 1d supply, fill #0

## 2024-03-11 ENCOUNTER — Telehealth: Payer: Self-pay

## 2024-03-11 NOTE — Telephone Encounter (Signed)
 Copied from CRM #[id number]. Topic: Clinical - Order For Equipment >> Mar 11, 2024  3:34 PM Harlene ORN wrote: Reason for CRM: Wants to know if this patient is active in the Practice. Will be faxing a form titled, Diabetes Medical Supplies.

## 2024-03-11 NOTE — Telephone Encounter (Signed)
 Returned Angel/ [facility] call and she was advised prescription request was received.

## 2024-03-25 ENCOUNTER — Other Ambulatory Visit (HOSPITAL_BASED_OUTPATIENT_CLINIC_OR_DEPARTMENT_OTHER): Payer: Self-pay

## 2024-03-26 DIAGNOSIS — E119 Type 2 diabetes mellitus without complications: Secondary | ICD-10-CM | POA: Diagnosis not present

## 2024-04-15 NOTE — Progress Notes (Signed)
 HPI: FU CAD. Admitted 11/16 with NSTEMI; cath revealed 40 RCA, 90 D1, 90 mild LAD, 95 distal LAD, 90 OM 2; had PCI of mid LAD with DES. Echocardiogram January 2022 showed normal LV function, mild left ventricular hypertrophy, grade 1 diastolic dysfunction, mild mitral regurgitation.  Since last seen the patient has dyspnea with more extreme activities but not with routine activities. It is relieved with rest. It is not associated with chest pain. There is no orthopnea, PND or pedal edema. There is no syncope or palpitations. There is no exertional chest pain.   Current Outpatient Medications  Medication Sig Dispense Refill   aspirin  EC 81 MG tablet Take 4 tablets (325 mg total) by mouth daily. (Patient taking differently: Take 81 mg by mouth daily.)     Cholecalciferol  (VITAMIN D ) 50 MCG (2000 UT) tablet Take 2,000 Units by mouth daily.     Coenzyme Q10 (COQ10) 100 MG CAPS Take 100 mg by mouth daily.     doxazosin  (CARDURA ) 8 MG tablet Take 1 tablet (8 mg total) by mouth at bedtime. 90 tablet 1   ferrous gluconate (FERGON) 240 (27 FE) MG tablet Take 240 mg by mouth daily.     finasteride  (PROSCAR ) 5 MG tablet TAKE 1 TABLET EVERY DAY 90 tablet 3   glimepiride  (AMARYL ) 4 MG tablet TAKE 1 TABLET EVERY DAY WITH BREAKFAST 90 tablet 3   imiquimod  (ALDARA ) 5 % cream Apply topically every day as tolerated for 6 weeks. 12 each 3   metFORMIN  (GLUCOPHAGE ) 500 MG tablet Take 1 tablet (500 mg total) by mouth daily with breakfast. 90 tablet 1   metoprolol  tartrate (LOPRESSOR ) 25 MG tablet Take 0.5 tablets (12.5 mg total) by mouth 2 (two) times daily. 90 tablet 3   Multiple Vitamin (MULTIVITAMIN WITH MINERALS) TABS tablet Take 1 tablet by mouth daily.     nitroGLYCERIN  (NITROSTAT ) 0.4 MG SL tablet PLACE 1 TABLET UNDER THE TONGUE EVERY 5 MINUTES AS NEEDED FOR CHESTPAIN 25 tablet 3   omeprazole  (PRILOSEC) 40 MG capsule Take 1 capsule (40 mg total) by mouth daily. 90 capsule 1   pioglitazone  (ACTOS ) 45 MG  tablet Take 1 tablet (45 mg total) by mouth daily. 90 tablet 1   rosuvastatin  (CRESTOR ) 5 MG tablet TAKE 1 TABLET EVERY DAY 90 tablet 3   Semaglutide ,0.25 or 0.5MG /DOS, (OZEMPIC , 0.25 OR 0.5 MG/DOSE,) 2 MG/3ML SOPN Inject 0.25 mg into the skin once a week. 3 mL 3   traMADol  (ULTRAM ) 50 MG tablet Take 1 tablet (50 mg total) by mouth every 6 (six) hours as needed. 20 tablet 0   zolpidem  (AMBIEN ) 10 MG tablet Take 1 tablet (10 mg total) by mouth at bedtime as needed. for sleep 90 tablet 0   No current facility-administered medications for this visit.     Past Medical History:  Diagnosis Date   Allergic rhinitis    Anemia    BPH (benign prostatic hyperplasia)    With obstruction/lower urinary tract symptoms. Takes cardura  for prostate.    CAD (coronary artery disease)    a. 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    Past Surgical History:  Procedure Laterality Date   APPENDECTOMY     BACK SURGERY     vertebral collapse in lower back had insturment, reports no metal   CARDIAC CATHETERIZATION N/A 05/02/2015   Procedure: Left Heart Cath and Coronary Angiography;  Surgeon: Dorn JINNY Lesches, MD;  Location: [facility] INVASIVE CV LAB;  Service: Cardiovascular;  Laterality: N/A;   CARDIAC CATHETERIZATION N/A 05/02/2015   Procedure: Coronary Stent Intervention;  Surgeon: Dorn JINNY Lesches, MD;  Location: [facility] INVASIVE CV LAB;  Service: Cardiovascular;  Laterality: N/A;   CORONARY ANGIOPLASTY WITH STENT PLACEMENT     JOINT REPLACEMENT Right    6 surgeries including total shoulder replacement roughly in 2000   MOHS SURGERY     TOTAL SHOULDER REPLACEMENT     TOTAL SHOULDER REVISION Right 04/20/2021   Procedure: Removal of infected right  shoulder arthroplasty, implantation of cement spacer;  Surgeon: Melita Drivers, MD;  Location: [facility] ORS;  Service: Orthopedics;  Laterality: Right;    Social History   Socioeconomic History   Marital status: Married    Spouse name: Not on file   Number of children: 1   Years of education: Not on file   Highest education level: Not on file  Occupational History   Occupation: Retired  Tobacco Use   Smoking status: Never    Passive exposure: Never   Smokeless tobacco: Never  Vaping Use   Vaping status: Never Used  Substance and Sexual Activity   Alcohol use: No   Drug use: No   Sexual activity: Not on file  Other Topics Concern   Not on file  Social History Narrative   Lives in [place], [place] with wife. No pets. No dietary restrictions      Retired from truck driving, long haul   No cigarettes, alcohol to excess   Seat belts every time.    Social Drivers of Corporate Investment Banker Strain: Low Risk  (08/02/2023)   Overall Financial Resource Strain (CARDIA)    Difficulty of Paying Living Expenses: Not hard at all  Food Insecurity: No Food Insecurity (08/02/2023)   Hunger Vital Sign    Worried About Running Out of Food in the Last Year: Never true    Ran Out of Food in the Last Year: Never true  Transportation Needs: No Transportation Needs (08/02/2023)   PRAPARE - Administrator, Civil Service (Medical): No    Lack of Transportation (Non-Medical): No  Physical Activity: Inactive (08/02/2023)   Exercise Vital Sign    Days of Exercise per Week: 0 days    Minutes of Exercise per Session: 0 min  Stress: No Stress Concern Present (08/02/2023)   [employer] of Occupational Health - Occupational Stress Questionnaire    Feeling of Stress : Not at all  Social Connections: Moderately Integrated (08/02/2023)   Social Connection and Isolation Panel    Frequency of Communication with Friends and Family: More than three times a week    Frequency of Social Gatherings  with Friends and Family: Three times a week    Attends Religious Services: More than 4 times per year    Active Member of Clubs or Organizations: No    Attends Banker Meetings: Never    Marital Status: Married  Catering Manager Violence: Not At Risk (08/02/2023)   Humiliation, Afraid, Rape, and Kick questionnaire    Fear of Current or Ex-Partner: No    Emotionally Abused: No    Physically Abused: No    Sexually  Abused: No    Family History  Problem Relation Age of Onset   Heart disease Mother        chf   Heart attack Father    CAD Father    Colitis Sister    Other Brother        head trauma   Heart disease Brother    Hyperlipidemia Brother    Hypertension Brother    Diabetes Brother    Kidney failure Brother    Diabetes Son    Kidney failure Maternal Aunt     ROS: no fevers or chills, productive cough, hemoptysis, dysphasia, odynophagia, melena, hematochezia, dysuria, hematuria, rash, seizure activity, orthopnea, PND, pedal edema, claudication. Remaining systems are negative.  Physical Exam: Well-developed well-nourished in no acute distress.  Skin is warm and dry.  HEENT is normal.  Neck is supple.  Chest is clear to auscultation with normal expansion.  Cardiovascular exam is regular rate and rhythm.  Abdominal exam nontender or distended. No masses palpated. Extremities show no edema. neuro grossly intact   A/P  1 coronary artery disease-patient denies chest pain.  Continue aspirin  and statin.  2 hypertension-BP controlled.  Continue present meds.  3 hyperlipidemia-continue Crestor .  Note he did not tolerate higher doses previously.  Most recent LDL 62.  Redell Shallow, MD

## 2024-04-22 ENCOUNTER — Ambulatory Visit: Attending: Cardiology | Admitting: Cardiology

## 2024-04-22 ENCOUNTER — Encounter: Payer: Self-pay | Admitting: Cardiology

## 2024-04-22 VITALS — BP 113/71 | HR 74 | Ht 65.0 in | Wt 145.0 lb

## 2024-04-22 DIAGNOSIS — E1165 Type 2 diabetes mellitus with hyperglycemia: Secondary | ICD-10-CM | POA: Diagnosis not present

## 2024-04-22 DIAGNOSIS — I10 Essential (primary) hypertension: Secondary | ICD-10-CM

## 2024-04-22 DIAGNOSIS — E7800 Pure hypercholesterolemia, unspecified: Secondary | ICD-10-CM

## 2024-04-22 DIAGNOSIS — I2510 Atherosclerotic heart disease of native coronary artery without angina pectoris: Secondary | ICD-10-CM

## 2024-04-22 DIAGNOSIS — N1832 Chronic kidney disease, stage 3b: Secondary | ICD-10-CM | POA: Diagnosis not present

## 2024-04-22 NOTE — Patient Instructions (Signed)
   Follow-Up: At [facility], you and your health needs are our priority.  As part of our continuing mission to provide you with exceptional heart care, our providers are all part of one team.  This team includes your primary Cardiologist (physician) and Advanced Practice Providers or APPs (Physician Assistants and Nurse Practitioners) who all work together to provide you with the care you need, when you need it.  Your next appointment:   12 month(s)  Provider:   Redell Shallow MD

## 2024-04-28 ENCOUNTER — Ambulatory Visit: Payer: Self-pay

## 2024-04-28 DIAGNOSIS — E785 Hyperlipidemia, unspecified: Secondary | ICD-10-CM | POA: Diagnosis not present

## 2024-04-28 DIAGNOSIS — I2510 Atherosclerotic heart disease of native coronary artery without angina pectoris: Secondary | ICD-10-CM | POA: Diagnosis not present

## 2024-04-28 DIAGNOSIS — Z880 Allergy status to penicillin: Secondary | ICD-10-CM | POA: Diagnosis not present

## 2024-04-28 DIAGNOSIS — I10 Essential (primary) hypertension: Secondary | ICD-10-CM | POA: Diagnosis not present

## 2024-04-28 DIAGNOSIS — D509 Iron deficiency anemia, unspecified: Secondary | ICD-10-CM | POA: Diagnosis not present

## 2024-04-28 DIAGNOSIS — G8929 Other chronic pain: Secondary | ICD-10-CM | POA: Diagnosis not present

## 2024-04-28 DIAGNOSIS — Z7984 Long term (current) use of oral hypoglycemic drugs: Secondary | ICD-10-CM | POA: Diagnosis not present

## 2024-04-28 DIAGNOSIS — N400 Enlarged prostate without lower urinary tract symptoms: Secondary | ICD-10-CM | POA: Diagnosis not present

## 2024-04-28 DIAGNOSIS — E119 Type 2 diabetes mellitus without complications: Secondary | ICD-10-CM | POA: Diagnosis not present

## 2024-04-28 DIAGNOSIS — K219 Gastro-esophageal reflux disease without esophagitis: Secondary | ICD-10-CM | POA: Diagnosis not present

## 2024-04-28 DIAGNOSIS — E559 Vitamin D deficiency, unspecified: Secondary | ICD-10-CM | POA: Diagnosis not present

## 2024-04-28 NOTE — Telephone Encounter (Signed)
 Called pt and no answer left vm to return call.

## 2024-04-28 NOTE — Telephone Encounter (Signed)
 Hayden Knack DNP Did a home visit They do this once a year Calendar has shown low blood sugars in the 60s within in the past two weeks Her recommendation is for primary care to continue Metformin  and discontinue the other three medications She advised When under 90 she advised patient to eat something right away On four diabetes medications--one is ozempic  Blood sugar for the past two weeks his blood sugar has been 60-65 & she states he is asymptomatic  Endocrinology cannot see him until Jan 13th   DNP states that she was leaving   FYI Only or Action Required?: Action required by provider: clinical question for provider, update on patient condition, and DNP requesting medication change and further advice for patient/reevaluation.  Patient was last seen in primary care on 11/28/2023 by Hayden Harlene LABOR, MD.  Called Nurse Triage reporting Hypoglycemia.  Symptoms began two weeks ago.  Interventions attempted: Rest, hydration, or home remedies.  Symptoms are: unchanged.  Triage Disposition: Call PCP When Office is Open  Patient/caregiver understands and will follow disposition?: Yes                 Copied from CRM [id number]. Topic: Clinical - Red Word Triage >> Apr 28, 2024  8:59 AM Hayden Garza wrote: Red Word that prompted transfer to Nurse Triage: Hayden Garza is a NP states she is doing a home visit and his blood sugar is 60-66. He is asymptomatic. On 4 different diabetes medications. Saw he had labs on 11/13 but is unable to see, is requesting to speak to a nurse to hopefully prevent visit to ER. Reason for Disposition  [1] Morning (before breakfast) blood glucose < 80 mg/dL (4.4 mmol/L) AND [7] more than once in past week  Answer Assessment - Initial Assessment Questions Hayden Knack DNP Did a home visit On four diabetes medications--one is ozempic  Blood sugar for the past two weeks his blood sugar has been 60-65 & she states he is asymptomatic  Endocrinology cannot see him  until Jan 13th  DNP Hayden Garza states that she needs his PCP or someone in his PCP office to see him and advise what medicines for blood sugar that he should not be taking.  166 post cereal at 9:20am  04/28/2024        1. SYMPTOMS: What symptoms are you concerned about?     ---- 2. ONSET:  When did the symptoms start?     For the past two weeks 3. BLOOD GLUCOSE: What is your blood glucose level?      ---- 4. USUAL RANGE: What is your blood glucose level usually? (e.g., usual fasting morning value, usual evening value)     ----- 5. TYPE 1 or 2:  Do you know what type of diabetes you have?  (e.g., Type 1, Type 2, Gestational; doesn't know)      ---- 6. INSULIN : Do you take insulin ? What type of insulin (s) do you use? What is the mode of delivery? (syringe, pen; injection or pump) When did you last give yourself an insulin  dose? (i.e., time or hours/minutes ago) How much did you give? (i.e., how many units)     ---- 7. DIABETES PILLS: Do you take any pills for your diabetes? If Yes, ask: What is the name of the medicine(s) that you take for high blood sugar?     ----- 8. OTHER SYMPTOMS: Do you have any symptoms? (e.g., fever, frequent urination, difficulty breathing, vomiting)     ----- 9. LOW BLOOD GLUCOSE TREATMENT:  What have you done so far to treat the low blood glucose level?     ----- 10. FOOD: When did you last eat or drink?       Post meal 11. ALONE: Are you alone right now or is someone with you?        DNP Hayden Garza with patient  Protocols used: Diabetes - Low Blood Sugar-A-AH

## 2024-04-28 NOTE — Telephone Encounter (Signed)
 Called [facility] and advised them of patient's blood sugar and DNP Quinn's concerns

## 2024-04-29 ENCOUNTER — Ambulatory Visit: Payer: Self-pay

## 2024-04-29 ENCOUNTER — Other Ambulatory Visit (HOSPITAL_BASED_OUTPATIENT_CLINIC_OR_DEPARTMENT_OTHER): Payer: Self-pay

## 2024-04-29 MED ORDER — OZEMPIC (0.25 OR 0.5 MG/DOSE) 2 MG/3ML ~~LOC~~ SOPN
0.2500 mg | PEN_INJECTOR | SUBCUTANEOUS | 3 refills | Status: DC
  Filled 2024-04-29 – 2024-04-30 (×2): qty 3, 56d supply, fill #0

## 2024-04-29 NOTE — Telephone Encounter (Signed)
 Call disconnected while this RN was completing triage, pt denies all symptoms, calling for an appt due to highs/lows recently.  Copied from CRM [id number]. Topic: Clinical - Red Word Triage >> Apr 29, 2024  2:57 PM Harlene ORN wrote: Red Word that prompted transfer to Nurse Triage: blood sugar was down to 64 Answer Assessment - Initial Assessment Questions 1. SYMPTOMS: What symptoms are you concerned about?     None 2. ONSET:  When did the symptoms start?     Today 3. BLOOD GLUCOSE: What is your blood glucose level?      64, intermittent 4. USUAL RANGE: What is your blood glucose level usually? (e.g., usual fasting morning value, usual evening value)     150 5. TYPE 1 or 2:  Do you know what type of diabetes you have?  (e.g., Type 1, Type 2, Gestational; doesn't know)      Type 2 6. INSULIN : Do you take insulin ? What type of insulin (s) do you use? What is the mode of delivery? (syringe, pen; injection or pump) When did you last give yourself an insulin  dose? (i.e., time or hours/minutes ago) How much did you give? (i.e., how many units)      7. DIABETES PILLS: Do you take any pills for your diabetes? If Yes, ask: What is the name of the medicine(s) that you take for high blood sugar?      8. OTHER SYMPTOMS: Do you have any symptoms? (e.g., fever, frequent urination, difficulty breathing, vomiting)      9. LOW BLOOD GLUCOSE TREATMENT: What have you done so far to treat the low blood glucose level?      10. FOOD: When did you last eat or drink?        11. ALONE: Are you alone right now or is someone with you?  Protocols used: Diabetes - Low Blood Sugar-A-AH

## 2024-04-29 NOTE — Telephone Encounter (Signed)
 PAS was unable to conference caller and clinical research associate, this writer returned call to [place] with [facility] [phone] ext [phone] and left voicemail message to return call. This clinical research associate then attempted to reach patient via home number of record but call went unanswered and voicemail box is full. Placing in call backs for further attempts.   Copied from CRM #[id number]. Topic: Clinical - Red Word Triage >> Apr 29, 2024 11:13 AM Antwanette L wrote: Red Word that prompted transfer to Nurse Triage: Glenard from [facility] Support reported that the patient's fasting blood sugar has been consistently in the 60s, while random blood sugar readings range from 150 to 160. Based on guidance from the in-home wellness team, the patient was advised to hold glimepiride  (AMARYL ) 4 MG tablet and pioglitazone  (ACTOS ) 45 MG tablet until evaluated by their provider. Krissie can be reached at [phone], extension [phone].

## 2024-04-29 NOTE — Telephone Encounter (Signed)
 Patient has been triaged already today. Review Dr. Domenica message from encounter from yesterday. Patient verbalized understanding. Reviewed with patient when his next appointment is along with symptoms of low blood sugar.   Copied from CRM #[id number]. Topic: Clinical - Red Word Triage >> Apr 29, 2024  4:46 PM Hayden Garza wrote: Red Word that prompted transfer to Nurse Triage: Low Blood Sugar- 64-65 (fasting). No symptoms from it being low. He also stated after he ate it was 151. Patient is wanting to speak with a nurse.  Stated his phone died earlier while speaking with a nurse.

## 2024-04-29 NOTE — Telephone Encounter (Signed)
 See previous encounter

## 2024-04-29 NOTE — Telephone Encounter (Signed)
 FYI Only or Action Required?: FYI only for provider: will update [facility] on Friday .  Patient was last seen in primary care on 11/28/2023 by Domenica Harlene LABOR, MD.  Called Nurse Triage reporting Hypoglycemia.  Symptoms began several days ago.  Interventions attempted: Rest, hydration, or home remedies.  Symptoms are: stable.  Triage Disposition: No disposition on file.  Patient/caregiver understands and will follow disposition?:   Copied from CRM [id number]. Topic: Clinical - Red Word Triage >> Apr 29, 2024 11:13 AM Antwanette L wrote: Red Word that prompted transfer to Nurse Triage: Glenard from [facility] Support reported that the patient's fasting blood sugar has been consistently in the 60s, while random blood sugar readings range from 150 to 160. Based on guidance from the in-home wellness team, the patient was advised to hold glimepiride  (AMARYL ) 4 MG tablet and pioglitazone  (ACTOS ) 45 MG tablet until evaluated by their provider. Krissie can be reached at [phone], extension [phone].   Reason for Disposition  [1] Caller requesting NON-URGENT health information AND [2] PCP's office is the best resource  Answer Assessment - Initial Assessment Questions 1. REASON FOR CALL: What is the main reason for your call? or How can I best help you?     Patient took his Ozempic  this morning. Unable to get a fasting sugar this morning due to an appt.   Krissie with [facility] is having daily calls with patient to track his fasting glucose. He held his glimiperide and generic Actos  today.  Still taking the metforman and Ozempic  dose this morning. Will reach back out Friday with update on fasting sugars. Remains Asymptomatic  Protocols used: Information Only Call - No Triage-A-AH

## 2024-04-29 NOTE — Telephone Encounter (Signed)
 Copied from CRM [id number]. Topic: Clinical - Red Word Triage >> Apr 29, 2024  2:57 PM Harlene ORN wrote: Red Word that prompted transfer to Nurse Triage: blood sugar was down to 64 >> Apr 29, 2024  3:16 PM Pinkey ORN wrote: Patient's state his phone died while speaking with Gretta Lauraine HERO, RN.

## 2024-04-30 ENCOUNTER — Other Ambulatory Visit (HOSPITAL_BASED_OUTPATIENT_CLINIC_OR_DEPARTMENT_OTHER): Payer: Self-pay

## 2024-05-01 ENCOUNTER — Telehealth: Payer: Self-pay

## 2024-05-01 NOTE — Telephone Encounter (Signed)
 Called [place] and the patient to relay the message from the provider. Left a confidential message explaining the providers instructions.

## 2024-05-01 NOTE — Telephone Encounter (Signed)
 Etta, RN called and said that he is holding off on the medications but this past Wednesday he did have his Ozempic  shot before the results were taken. She also stated that he told her he has been having a nightly snack, such as grapes most nights before bed. She said that she will tell him to not take his Ozempic  shot on this coming Wednesday and that he will hold off on all the medications you suggested if his numbers are in the 60's. He will also notify us  of his numbers throughout the week.

## 2024-05-01 NOTE — Telephone Encounter (Signed)
 Copied from CRM [id number]. Topic: Clinical - Medical Advice >> May 01, 2024 10:03 AM Tinnie BROCKS wrote: Reason for CRM: Etta (phone nurse with [facility]) was told to call us  back for updated fasting blood sugar levels to determine what to do with his meds. Yesterday 11:20AM fasting was 69. Yesterday afternoon 2.5 hrs after eating it was 220. Today fasting is 78. They are still holding glomiperide and pioglidosone. He is taking metformin  and ozempic . If you need to reach christie, her # is [phone] (confidential VM, ok to leave message)

## 2024-05-04 NOTE — Telephone Encounter (Signed)
Spoke w/ Pt- informed of recommendations. Pt verbalized understanding.  

## 2024-05-04 NOTE — Telephone Encounter (Signed)
 Copied from CRM #[id number]. Topic: Clinical - Medical Advice >> May 04, 2024  8:01 AM Victoria A wrote: Reason for CRM: Patient called to provide Glucose reading for the dates of :11/20 69; 11/21 78;11/22 109; 11/23 87; 11/24 97  Patient gave alternate number to call him on because he will not be home   [phone]

## 2024-05-05 DIAGNOSIS — E875 Hyperkalemia: Secondary | ICD-10-CM | POA: Diagnosis not present

## 2024-05-05 DIAGNOSIS — N400 Enlarged prostate without lower urinary tract symptoms: Secondary | ICD-10-CM | POA: Diagnosis not present

## 2024-05-05 DIAGNOSIS — I129 Hypertensive chronic kidney disease with stage 1 through stage 4 chronic kidney disease, or unspecified chronic kidney disease: Secondary | ICD-10-CM | POA: Diagnosis not present

## 2024-05-05 DIAGNOSIS — N184 Chronic kidney disease, stage 4 (severe): Secondary | ICD-10-CM | POA: Diagnosis not present

## 2024-05-05 DIAGNOSIS — E1122 Type 2 diabetes mellitus with diabetic chronic kidney disease: Secondary | ICD-10-CM | POA: Diagnosis not present

## 2024-05-05 DIAGNOSIS — D631 Anemia in chronic kidney disease: Secondary | ICD-10-CM | POA: Diagnosis not present

## 2024-05-08 ENCOUNTER — Other Ambulatory Visit: Payer: Self-pay | Admitting: Family Medicine

## 2024-05-11 ENCOUNTER — Other Ambulatory Visit (HOSPITAL_BASED_OUTPATIENT_CLINIC_OR_DEPARTMENT_OTHER): Payer: Self-pay

## 2024-05-11 NOTE — Telephone Encounter (Signed)
 Initial Comment Caller states he needs to call in his BS reading. Tue 105 Wed 103 Thur 110 Fri 108. Translation No Disp. Time Titus Time) Disposition Final User 05/08/2024 8:00:36 AM General Information Provided Yes Hayden Garza Final Disposition 05/08/2024 8:00:36 AM General Information Provided Yes Hayden Garza

## 2024-05-13 ENCOUNTER — Telehealth: Payer: Self-pay

## 2024-05-13 NOTE — Addendum Note (Signed)
 Addended by: ESTELLE GILLIS D on: 05/13/2024 04:12 PM   Modules accepted: Orders

## 2024-05-13 NOTE — Telephone Encounter (Signed)
 Pt had stopped the ozempic  about 2 weeks ago.  He will continue to hold off can discuss again at next appointment on 06/01/24.

## 2024-05-13 NOTE — Telephone Encounter (Signed)
 Tried calling pt back but phone was ringing went silent and hung up.

## 2024-05-13 NOTE — Telephone Encounter (Signed)
 Copied from CRM #[id number]. Topic: Clinical - Medication Question >> May 13, 2024 10:29 AM Willma SAUNDERS wrote: Reason for CRM: Patient calling in to repost his Blood Sugar readings: Tuesday 11/25 -105 Wednesday 11/26 -103 Thursday 11/27 -110 Friday 1/28 -108 Saturday 11/29 -120 Sunday 11/30 -105 Monday 12/1 -113 Tuesday 12/2 -113 Wednesday 12/3 -154  Patient would like to know if needs to continue administering his shot.  Patient can be reached at [phone]

## 2024-05-21 ENCOUNTER — Other Ambulatory Visit (HOSPITAL_BASED_OUTPATIENT_CLINIC_OR_DEPARTMENT_OTHER): Payer: Self-pay

## 2024-05-31 NOTE — Assessment & Plan Note (Signed)
 Hydrate and monitor

## 2024-05-31 NOTE — Assessment & Plan Note (Signed)
 Encourage heart healthy diet such as MIND or DASH diet, increase exercise, avoid trans fats, simple carbohydrates and processed foods, consider a krill or fish or flaxseed oil cap daily. Tolerating Rosuvastatin

## 2024-05-31 NOTE — Progress Notes (Unsigned)
 "  Subjective:    Patient ID: Hayden Garza, male    DOB: 11/19/1932, 89 y.o.   MRN: [id number]  No chief complaint on file.   HPI Discussed the use of AI scribe software for clinical note transcription with the patient, who gave verbal consent to proceed.  History of Present Illness     Past Medical History:  Diagnosis Date   Allergic rhinitis    Anemia    BPH (benign prostatic hyperplasia)    With obstruction/lower urinary tract symptoms. Takes cardura  for prostate.    CAD (coronary artery disease)    a. 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    Past Surgical History:  Procedure Laterality Date   APPENDECTOMY     BACK SURGERY     vertebral collapse in lower back had insturment, reports no metal   CARDIAC CATHETERIZATION N/A 05/02/2015   Procedure: Left Heart Cath and Coronary Angiography;  Surgeon: Dorn JINNY Lesches, MD;  Location: [facility] INVASIVE CV LAB;  Service: Cardiovascular;  Laterality: N/A;   CARDIAC CATHETERIZATION N/A 05/02/2015   Procedure: Coronary Stent Intervention;  Surgeon: Dorn JINNY Lesches, MD;  Location: [facility] INVASIVE CV LAB;  Service: Cardiovascular;  Laterality: N/A;   CORONARY ANGIOPLASTY WITH STENT PLACEMENT     JOINT REPLACEMENT Right    6 surgeries including total shoulder replacement roughly in 2000   MOHS SURGERY     TOTAL SHOULDER REPLACEMENT     TOTAL SHOULDER REVISION Right 04/20/2021   Procedure: Removal of infected right shoulder arthroplasty, implantation of cement spacer;  Surgeon: Melita Drivers, MD;  Location: [facility] ORS;  Service: Orthopedics;  Laterality: Right;    Family History  Problem Relation Age of Onset   Heart disease Mother        chf    Heart attack Father    CAD Father    Colitis Sister    Other Brother        head trauma   Heart disease Brother    Hyperlipidemia Brother    Hypertension Brother    Diabetes Brother    Kidney failure Brother    Diabetes Son    Kidney failure Maternal Aunt     Social History   Socioeconomic History   Marital status: Married    Spouse name: Not on file   Number of children: 1   Years of education: Not on file   Highest education level: Not on file  Occupational History   Occupation: Retired  Tobacco Use   Smoking status: Never    Passive exposure: Never   Smokeless tobacco: Never  Vaping Use   Vaping status: Never Used  Substance and Sexual Activity   Alcohol use: No   Drug use: No   Sexual activity: Not on file  Other Topics Concern   Not on file  Social History Narrative   Lives in [place], [place] with wife. No pets. No dietary restrictions      Retired from truck driving, long haul   No cigarettes, alcohol to excess   Seat belts every time.    Social Drivers of Health   Tobacco Use: Low Risk (04/22/2024)  Patient History    Smoking Tobacco Use: Never    Smokeless Tobacco Use: Never    Passive Exposure: Never  Financial Resource Strain: Low Risk (08/02/2023)   Overall Financial Resource Strain (CARDIA)    Difficulty of Paying Living Expenses: Not hard at all  Food Insecurity: No Food Insecurity (08/02/2023)   Hunger Vital Sign    Worried About Running Out of Food in the Last Year: Never true    Ran Out of Food in the Last Year: Never true  Transportation Needs: No Transportation Needs (08/02/2023)   PRAPARE - Administrator, Civil Service (Medical): No    Lack of Transportation (Non-Medical): No  Physical Activity: Inactive (08/02/2023)   Exercise Vital Sign    Days of Exercise per Week: 0 days    Minutes of Exercise per Session: 0 min  Stress: No Stress Concern Present (08/02/2023)   [employer] of  Occupational Health - Occupational Stress Questionnaire    Feeling of Stress : Not at all  Social Connections: Moderately Integrated (08/02/2023)   Social Connection and Isolation Panel    Frequency of Communication with Friends and Family: More than three times a week    Frequency of Social Gatherings with Friends and Family: Three times a week    Attends Religious Services: More than 4 times per year    Active Member of Clubs or Organizations: No    Attends Banker Meetings: Never    Marital Status: Married  Catering Manager Violence: Not At Risk (08/02/2023)   Humiliation, Afraid, Rape, and Kick questionnaire    Fear of Current or Ex-Partner: No    Emotionally Abused: No    Physically Abused: No    Sexually Abused: No  Depression (PHQ2-9): Low Risk (08/02/2023)   Depression (PHQ2-9)    PHQ-2 Score: 0  Alcohol Screen: Low Risk (08/02/2023)   Alcohol Screen    Last Alcohol Screening Score (AUDIT): 0  Housing: Low Risk (08/02/2023)   Housing Stability Vital Sign    Unable to Pay for Housing in the Last Year: No    Number of Times Moved in the Last Year: 0    Homeless in the Last Year: No  Utilities: Not At Risk (08/02/2023)   AHC Utilities    Threatened with loss of utilities: No  Health Literacy: Adequate Health Literacy (08/02/2023)   B1300 Health Literacy    Frequency of need for help with medical instructions: Never    Outpatient Medications Prior to Visit  Medication Sig Dispense Refill   aspirin  EC 81 MG tablet Take 4 tablets (325 mg total) by mouth daily. (Patient taking differently: Take 81 mg by mouth daily.)     Cholecalciferol  (VITAMIN D ) 50 MCG (2000 UT) tablet Take 2,000 Units by mouth daily.     Coenzyme Q10 (COQ10) 100 MG CAPS Take 100 mg by mouth daily.     doxazosin  (CARDURA ) 8 MG tablet TAKE 1 TABLET AT BEDTIME 90 tablet 3   ferrous gluconate (FERGON) 240 (27 FE) MG tablet Take 240 mg by mouth daily.     finasteride  (PROSCAR ) 5  MG tablet TAKE 1 TABLET EVERY DAY 90 tablet 3   glimepiride  (AMARYL ) 4 MG tablet TAKE 1 TABLET EVERY DAY WITH BREAKFAST 90 tablet 3   imiquimod  (ALDARA ) 5 % cream Apply topically every day as tolerated for 6 weeks. 12 each 3   metFORMIN  (GLUCOPHAGE ) 500 MG tablet Take 1 tablet (500 mg total) by mouth daily with breakfast. 90  tablet 1   metoprolol  tartrate (LOPRESSOR ) 25 MG tablet Take 0.5 tablets (12.5 mg total) by mouth 2 (two) times daily. 90 tablet 3   Multiple Vitamin (MULTIVITAMIN WITH MINERALS) TABS tablet Take 1 tablet by mouth daily.     nitroGLYCERIN  (NITROSTAT ) 0.4 MG SL tablet PLACE 1 TABLET UNDER THE TONGUE EVERY 5 MINUTES AS NEEDED FOR CHESTPAIN 25 tablet 3   omeprazole  (PRILOSEC) 40 MG capsule TAKE 1 CAPSULE EVERY DAY 90 capsule 3   pioglitazone  (ACTOS ) 45 MG tablet Take 1 tablet (45 mg total) by mouth daily. 90 tablet 1   rosuvastatin  (CRESTOR ) 5 MG tablet TAKE 1 TABLET EVERY DAY 90 tablet 3   Semaglutide ,0.25 or 0.5MG /DOS, (OZEMPIC , 0.25 OR 0.5 MG/DOSE,) 2 MG/3ML SOPN Inject 0.25 mg into the skin once a week. (Patient not taking: Reported on 05/13/2024) 3 mL 3   traMADol  (ULTRAM ) 50 MG tablet Take 1 tablet (50 mg total) by mouth every 6 (six) hours as needed. 20 tablet 0   zolpidem  (AMBIEN ) 10 MG tablet Take 1 tablet (10 mg total) by mouth at bedtime as needed. for sleep 90 tablet 0   No facility-administered medications prior to visit.    Allergies[1]  Review of Systems  Constitutional:  Negative for fever and malaise/fatigue.  HENT:  Negative for congestion.   Eyes:  Negative for blurred vision.  Respiratory:  Negative for shortness of breath.   Cardiovascular:  Negative for chest pain, palpitations and leg swelling.  Gastrointestinal:  Negative for abdominal pain, blood in stool and nausea.  Genitourinary:  Negative for dysuria and frequency.  Musculoskeletal:  Negative for falls.  Skin:  Negative for rash.  Neurological:  Negative for dizziness, loss of  consciousness and headaches.  Endo/Heme/Allergies:  Negative for environmental allergies.  Psychiatric/Behavioral:  Negative for depression. The patient is not nervous/anxious.        Objective:    Physical Exam Vitals reviewed.  Constitutional:      Appearance: Normal appearance. He is not ill-appearing.  HENT:     Head: Normocephalic and atraumatic.     Nose: Nose normal.  Eyes:     Conjunctiva/sclera: Conjunctivae normal.  Cardiovascular:     Rate and Rhythm: Normal rate.     Pulses: Normal pulses.     Heart sounds: Normal heart sounds. No murmur heard. Pulmonary:     Effort: Pulmonary effort is normal.     Breath sounds: Normal breath sounds. No wheezing.  Abdominal:     Palpations: Abdomen is soft. There is no mass.     Tenderness: There is no abdominal tenderness.  Musculoskeletal:     Cervical back: Normal range of motion.     Right lower leg: No edema.     Left lower leg: No edema.  Skin:    General: Skin is warm and dry.  Neurological:     General: No focal deficit present.     Mental Status: He is alert and oriented to person, place, and time.  Psychiatric:        Mood and Affect: Mood normal.    There were no vitals taken for this visit. Wt Readings from Last 3 Encounters:  04/22/24 145 lb (65.8 kg)  11/28/23 150 lb 12.8 oz (68.4 kg)  09/16/23 156 lb (70.8 kg)    Diabetic Foot Exam - Simple   No data filed    Lab Results  Component Value Date   WBC 8.8 12/27/2023   HGB 10.9 (L) 12/27/2023   HCT 32.5 (  L) 12/27/2023   PLT 170.0 12/27/2023   GLUCOSE 191 (H) 11/28/2023   CHOL 143 11/28/2023   TRIG 186.0 (H) 11/28/2023   HDL 43.20 11/28/2023   LDLCALC 62 11/28/2023   ALT 9 11/28/2023   AST 10 11/28/2023   NA 140 11/28/2023   K 4.6 11/28/2023   CL 105 11/28/2023   CREATININE 2.07 (H) 11/28/2023   BUN 31 (H) 11/28/2023   CO2 27 11/28/2023   TSH 3.84 11/28/2023   INR 1.13 05/01/2015   HGBA1C 8.6 (H) 11/28/2023   MICROALBUR 3.9 (H) 11/28/2023     Lab Results  Component Value Date   TSH 3.84 11/28/2023   Lab Results  Component Value Date   WBC 8.8 12/27/2023   HGB 10.9 (L) 12/27/2023   HCT 32.5 (L) 12/27/2023   MCV 99.1 12/27/2023   PLT 170.0 12/27/2023   Lab Results  Component Value Date   NA 140 11/28/2023   K 4.6 11/28/2023   CO2 27 11/28/2023   GLUCOSE 191 (H) 11/28/2023   BUN 31 (H) 11/28/2023   CREATININE 2.07 (H) 11/28/2023   BILITOT 0.4 11/28/2023   ALKPHOS 66 11/28/2023   AST 10 11/28/2023   ALT 9 11/28/2023   PROT 6.8 11/28/2023   ALBUMIN  4.2 11/28/2023   CALCIUM  9.6 11/28/2023   ANIONGAP 10 04/20/2021   EGFR 37 (L) 05/17/2022   GFR 27.99 (L) 11/28/2023   Lab Results  Component Value Date   CHOL 143 11/28/2023   Lab Results  Component Value Date   HDL 43.20 11/28/2023   Lab Results  Component Value Date   LDLCALC 62 11/28/2023   Lab Results  Component Value Date   TRIG 186.0 (H) 11/28/2023   Lab Results  Component Value Date   CHOLHDL 3 11/28/2023   Lab Results  Component Value Date   HGBA1C 8.6 (H) 11/28/2023       Assessment & Plan:  Chronic renal impairment, unspecified CKD stage Assessment & Plan: Hydrate and monitor    Diabetes mellitus type 2, noninsulin dependent (HCC) Assessment & Plan: hgba1c acceptable, minimize simple carbs. Increase exercise as tolerated. Continue current meds.   Essential hypertension Assessment & Plan: Well controlled, no changes to meds. Encouraged heart healthy diet such as the DASH diet and exercise as tolerated.    Pure hypercholesterolemia Assessment & Plan: Encourage heart healthy diet such as MIND or DASH diet, increase exercise, avoid trans fats, simple carbohydrates and processed foods, consider a krill or fish or flaxseed oil cap daily. Tolerating Rosuvastatin      Assessment and Plan Assessment & Plan      Harlene Horton, MD     [1] Allergies Allergen Reactions   Lipitor [Atorvastatin ]     Joint and muscle pain    Penicillins     swelling in joints  "

## 2024-05-31 NOTE — Assessment & Plan Note (Signed)
 hgba1c acceptable, minimize simple carbs. Increase exercise as tolerated. Continue current meds

## 2024-05-31 NOTE — Assessment & Plan Note (Signed)
 Well controlled, no changes to meds. Encouraged heart healthy diet such as the DASH diet and exercise as tolerated.

## 2024-06-01 ENCOUNTER — Other Ambulatory Visit (HOSPITAL_BASED_OUTPATIENT_CLINIC_OR_DEPARTMENT_OTHER): Payer: Self-pay

## 2024-06-01 ENCOUNTER — Encounter: Payer: Self-pay | Admitting: Family Medicine

## 2024-06-01 ENCOUNTER — Ambulatory Visit (INDEPENDENT_AMBULATORY_CARE_PROVIDER_SITE_OTHER): Admitting: Family Medicine

## 2024-06-01 VITALS — BP 110/52 | HR 56 | Temp 97.7°F | Resp 16 | Ht 65.0 in | Wt 144.6 lb

## 2024-06-01 DIAGNOSIS — E7800 Pure hypercholesterolemia, unspecified: Secondary | ICD-10-CM

## 2024-06-01 DIAGNOSIS — I10 Essential (primary) hypertension: Secondary | ICD-10-CM

## 2024-06-01 DIAGNOSIS — N289 Disorder of kidney and ureter, unspecified: Secondary | ICD-10-CM | POA: Diagnosis not present

## 2024-06-01 DIAGNOSIS — E119 Type 2 diabetes mellitus without complications: Secondary | ICD-10-CM | POA: Diagnosis not present

## 2024-06-01 LAB — CBC WITH DIFFERENTIAL/PLATELET
Basophils Absolute: 0.1 K/uL (ref 0.0–0.1)
Basophils Relative: 0.7 % (ref 0.0–3.0)
Eosinophils Absolute: 0.2 K/uL (ref 0.0–0.7)
Eosinophils Relative: 2.9 % (ref 0.0–5.0)
HCT: 33.2 % — ABNORMAL LOW (ref 39.0–52.0)
Hemoglobin: 11.5 g/dL — ABNORMAL LOW (ref 13.0–17.0)
Lymphocytes Relative: 25.1 % (ref 12.0–46.0)
Lymphs Abs: 2.1 K/uL (ref 0.7–4.0)
MCHC: 34.8 g/dL (ref 30.0–36.0)
MCV: 98.2 fl (ref 78.0–100.0)
Monocytes Absolute: 0.6 K/uL (ref 0.1–1.0)
Monocytes Relative: 7.6 % (ref 3.0–12.0)
Neutro Abs: 5.3 K/uL (ref 1.4–7.7)
Neutrophils Relative %: 63.7 % (ref 43.0–77.0)
Platelets: 178 K/uL (ref 150.0–400.0)
RBC: 3.38 Mil/uL — ABNORMAL LOW (ref 4.22–5.81)
RDW: 12.9 % (ref 11.5–15.5)
WBC: 8.3 K/uL (ref 4.0–10.5)

## 2024-06-01 LAB — COMPREHENSIVE METABOLIC PANEL WITH GFR
ALT: 15 U/L (ref 3–53)
AST: 17 U/L (ref 5–37)
Albumin: 4.3 g/dL (ref 3.5–5.2)
Alkaline Phosphatase: 70 U/L (ref 39–117)
BUN: 32 mg/dL — ABNORMAL HIGH (ref 6–23)
CO2: 28 meq/L (ref 19–32)
Calcium: 9.4 mg/dL (ref 8.4–10.5)
Chloride: 102 meq/L (ref 96–112)
Creatinine, Ser: 2.31 mg/dL — ABNORMAL HIGH (ref 0.40–1.50)
GFR: 24.45 mL/min — ABNORMAL LOW
Glucose, Bld: 236 mg/dL — ABNORMAL HIGH (ref 70–99)
Potassium: 5.4 meq/L — ABNORMAL HIGH (ref 3.5–5.1)
Sodium: 140 meq/L (ref 135–145)
Total Bilirubin: 0.5 mg/dL (ref 0.2–1.2)
Total Protein: 6.7 g/dL (ref 6.0–8.3)

## 2024-06-01 LAB — LIPID PANEL
Cholesterol: 134 mg/dL (ref 28–200)
HDL: 46.1 mg/dL
LDL Cholesterol: 58 mg/dL (ref 10–99)
NonHDL: 87.57
Total CHOL/HDL Ratio: 3
Triglycerides: 149 mg/dL (ref 10.0–149.0)
VLDL: 29.8 mg/dL (ref 0.0–40.0)

## 2024-06-01 LAB — TSH: TSH: 4.11 u[IU]/mL (ref 0.35–5.50)

## 2024-06-01 LAB — HEMOGLOBIN A1C: Hgb A1c MFr Bld: 7.2 % — ABNORMAL HIGH (ref 4.6–6.5)

## 2024-06-01 MED ORDER — CEFDINIR 300 MG PO CAPS
300.0000 mg | ORAL_CAPSULE | Freq: Two times a day (BID) | ORAL | 0 refills | Status: AC
  Filled 2024-06-01: qty 20, 10d supply, fill #0

## 2024-06-01 MED ORDER — CEFDINIR 300 MG PO CAPS: 300.0000 mg | ORAL_CAPSULE | Freq: Two times a day (BID) | ORAL | 0 refills | Status: DC

## 2024-06-01 NOTE — Patient Instructions (Signed)
 Restart PioglitizoneDiabetes: Carbohydrate Counting for Adults Carbohydrate counting is a method of keeping track of how many carbohydrates you eat. Eating carbohydrates increases the amount of sugar, also called glucose, in your blood. By counting how many carbohydrates you eat, you can improve how well you manage your blood sugar. This, in turn, helps you manage your diabetes. Carbohydrates are measured in grams (g) per serving. It's important to know how many carbohydrates (in grams or by serving size) you can have in each meal. This is different for every person. A dietitian can help you make a meal plan and calculate how many carbohydrates you should have at each meal and snack. What foods contain carbohydrates? Carbohydrates are found in these foods: Grains, such as breads and cereals. Dried beans and soy products. Starchy vegetables, such as potatoes, peas, and corn. Fruit and fruit juices. Milk and yogurt. Sweets and snack foods like cake, cookies, candy, chips, and soft drinks. How do I count carbohydrates in foods? There are two ways to count carbohydrates in food. You can read food labels or learn standard serving sizes of foods. You can use either of these methods or a combination of both. Using the Nutrition Facts label The Nutrition Facts list is included on the labels of almost all packaged foods and drinks in the [place] It includes: The serving size. Information about nutrients in each serving. This includes the grams of carbohydrate per serving. To use the Nutrition Facts, decide how many servings you will have. Then, multiply the number of servings by the number of carbohydrates per serving. The resulting number is the total grams of carbohydrates that you'll be having. Learning the standard serving sizes of foods When you eat carbohydrate foods that aren't packaged or don't include Nutrition Facts on the label, you need to measure the servings in order to count the grams of  carbohydrates. Measure the foods that you'll eat with a food scale or measuring cup, if needed. Decide how many standard-size servings you'll eat. Multiply the number of servings by 15. For foods that contain carbohydrates, one serving equals 15 g of carbohydrates. For example, if you eat 2 cups or 10 oz (300 g) of strawberries, you'll have eaten 2 servings and 30 g of carbohydrates (2 servings x 15 g = 30 g). For foods that have more than one food mixed, such as soups and casseroles, you must count the carbohydrates in each food that's included. Here's a list of standard serving sizes for common carbohydrate-rich foods. Each of these servings has about 15 g of carbohydrates: 1 slice of bread. 1 six-inch (15 cm) tortilla. ? cup or 2 oz (53 g) of cooked rice or pasta.  cup or 3 oz (85 g) of cooked or canned, drained, and rinsed beans or lentils.  cup or 3 oz (85 g) of a starchy vegetable, such as peas, corn, or squash.  cup or 4 oz (120 g) of hot cereal.  cup or 3 oz (85 g) of boiled or mashed potatoes, or  or 3 oz (85 g) of a large baked potato.  cup or 4 fl oz (118 mL) of fruit juice. 1 cup or 8 fl oz (237 mL) of milk. 1 small or 4 oz (106 g) apple.  or 2 oz (63 g) of a medium banana. 1 cup or 5 oz (150 g) of strawberries. 3 cups or 1 oz (28.3 g) of popped popcorn. What is an example of carbohydrate counting? To calculate the grams of carbohydrates in this  sample meal, follow the steps below. Sample meal 3 oz (85 g) chicken breast. ? cup or 4 oz (106 g) of brown rice.  cup or 3 oz (85 g) of corn. 1 cup or 8 fl oz (237 mL) of milk. 1 cup or 5 oz (150 g) of strawberries with sugar-free whipped topping. Carbohydrate calculation Identify the foods that have carbohydrates: Rice. Corn. Milk. Strawberries. Calculate how many servings you have of each food: 2 servings of rice. 1 serving of corn. 1 serving of milk. 1 serving of strawberries. Multiply each number of servings by  15 g: 2 servings of rice x 15 g = 30 g. 1 serving of corn x 15 g = 15 g. 1 serving of milk x 15 g = 15 g. 1 serving of strawberries x 15 g = 15 g. Add together all of the amounts to find the total grams of carbohydrates eaten: 30 g + 15 g + 15 g + 15 g = 75 g of carbohydrates total. Where to find more information To learn more, go to: American Diabetes Association at diabetes.org. Click Search and type carb counting. Find the link you need. Centers for Disease Control and Prevention at tonerpromos.no. Click Search and type diabetes. Find the link you need. Academy of Nutrition and Dietetics: eatright.org This information is not intended to replace advice given to you by your health care provider. Make sure you discuss any questions you have with your health care provider. Document Revised: 05/15/2023 Document Reviewed: 05/15/2023 Elsevier Patient Education  2025 [employer].

## 2024-06-02 ENCOUNTER — Ambulatory Visit: Payer: Self-pay | Admitting: Family Medicine

## 2024-06-02 DIAGNOSIS — E875 Hyperkalemia: Secondary | ICD-10-CM

## 2024-06-08 ENCOUNTER — Ambulatory Visit: Payer: Self-pay | Admitting: Family Medicine

## 2024-06-08 ENCOUNTER — Other Ambulatory Visit (INDEPENDENT_AMBULATORY_CARE_PROVIDER_SITE_OTHER)

## 2024-06-08 DIAGNOSIS — I10 Essential (primary) hypertension: Secondary | ICD-10-CM

## 2024-06-08 DIAGNOSIS — E875 Hyperkalemia: Secondary | ICD-10-CM | POA: Diagnosis not present

## 2024-06-08 DIAGNOSIS — E119 Type 2 diabetes mellitus without complications: Secondary | ICD-10-CM

## 2024-06-08 DIAGNOSIS — D649 Anemia, unspecified: Secondary | ICD-10-CM | POA: Diagnosis not present

## 2024-06-08 DIAGNOSIS — E7800 Pure hypercholesterolemia, unspecified: Secondary | ICD-10-CM

## 2024-06-08 LAB — CBC WITH DIFFERENTIAL/PLATELET
Basophils Absolute: 0.1 K/uL (ref 0.0–0.1)
Basophils Relative: 0.6 % (ref 0.0–3.0)
Eosinophils Absolute: 0.3 K/uL (ref 0.0–0.7)
Eosinophils Relative: 3.4 % (ref 0.0–5.0)
HCT: 33 % — ABNORMAL LOW (ref 39.0–52.0)
Hemoglobin: 11.2 g/dL — ABNORMAL LOW (ref 13.0–17.0)
Lymphocytes Relative: 31.3 % (ref 12.0–46.0)
Lymphs Abs: 2.5 K/uL (ref 0.7–4.0)
MCHC: 34 g/dL (ref 30.0–36.0)
MCV: 99.2 fl (ref 78.0–100.0)
Monocytes Absolute: 0.7 K/uL (ref 0.1–1.0)
Monocytes Relative: 9.4 % (ref 3.0–12.0)
Neutro Abs: 4.4 K/uL (ref 1.4–7.7)
Neutrophils Relative %: 55.3 % (ref 43.0–77.0)
Platelets: 156 K/uL (ref 150.0–400.0)
RBC: 3.33 Mil/uL — ABNORMAL LOW (ref 4.22–5.81)
RDW: 13.3 % (ref 11.5–15.5)
WBC: 8 K/uL (ref 4.0–10.5)

## 2024-06-08 LAB — COMPREHENSIVE METABOLIC PANEL WITH GFR
ALT: 16 U/L (ref 3–53)
AST: 18 U/L (ref 5–37)
Albumin: 4.2 g/dL (ref 3.5–5.2)
Alkaline Phosphatase: 61 U/L (ref 39–117)
BUN: 29 mg/dL — ABNORMAL HIGH (ref 6–23)
CO2: 26 meq/L (ref 19–32)
Calcium: 9.2 mg/dL (ref 8.4–10.5)
Chloride: 105 meq/L (ref 96–112)
Creatinine, Ser: 1.98 mg/dL — ABNORMAL HIGH (ref 0.40–1.50)
GFR: 29.41 mL/min — ABNORMAL LOW
Glucose, Bld: 208 mg/dL — ABNORMAL HIGH (ref 70–99)
Potassium: 4.6 meq/L (ref 3.5–5.1)
Sodium: 141 meq/L (ref 135–145)
Total Bilirubin: 0.5 mg/dL (ref 0.2–1.2)
Total Protein: 6.5 g/dL (ref 6.0–8.3)

## 2024-06-09 LAB — VITAMIN B12: Vitamin B-12: 1942 pg/mL — ABNORMAL HIGH (ref 200–1100)

## 2024-06-22 ENCOUNTER — Other Ambulatory Visit (HOSPITAL_BASED_OUTPATIENT_CLINIC_OR_DEPARTMENT_OTHER): Payer: Self-pay

## 2024-06-23 ENCOUNTER — Other Ambulatory Visit (HOSPITAL_BASED_OUTPATIENT_CLINIC_OR_DEPARTMENT_OTHER): Payer: Self-pay

## 2024-06-23 MED ORDER — OZEMPIC (0.25 OR 0.5 MG/DOSE) 2 MG/3ML ~~LOC~~ SOPN
0.2500 mg | PEN_INJECTOR | SUBCUTANEOUS | 3 refills | Status: AC
  Filled 2024-06-23: qty 3, 56d supply, fill #0

## 2024-07-06 ENCOUNTER — Other Ambulatory Visit: Payer: Self-pay

## 2024-07-06 MED ORDER — ZOLPIDEM TARTRATE 10 MG PO TABS: 10.0000 mg | ORAL_TABLET | Freq: Every evening | ORAL | 0 refills | Status: AC | PRN

## 2024-07-06 NOTE — Telephone Encounter (Signed)
 Requesting: Ambien  10mg   Contract: 04/25/22 UDS: 07/02/23 Last Visit:06/01/24 Next Visit: None Last Refill: 02/27/24 #90 and 0RF  Please Advise

## 2024-07-06 NOTE — Telephone Encounter (Signed)
 Copied from CRM #[id number]. Topic: Clinical - Medication Refill >> Jul 06, 2024  9:48 AM Alfonso HERO wrote: Medication:  zolpidem  (AMBIEN ) 10 MG tablet   Has the patient contacted their pharmacy? Yes (Agent: If no, request that the patient contact the pharmacy for the refill. If patient does not wish to contact the pharmacy document the reason why and proceed with request.) (Agent: If yes, when and what did the pharmacy advise?)  This is the patient's preferred pharmacy:  [facility] - [place], [place] - [address] [place] [address] Phone: [phone] Fax: [phone]    Is this the correct pharmacy for this prescription? Yes If no, delete pharmacy and type the correct one.   Has the prescription been filled recently? Yes  Is the patient out of the medication? Yes  Has the patient been seen for an appointment in the last year OR does the patient have an upcoming appointment? Yes  Can we respond through MyChart? Yes  Agent: Please be advised that Rx refills may take up to 3 business days. We ask that you follow-up with your pharmacy.

## 2024-07-08 ENCOUNTER — Ambulatory Visit

## 2024-08-03 ENCOUNTER — Ambulatory Visit

## 2024-08-31 ENCOUNTER — Other Ambulatory Visit

## 2025-01-11 ENCOUNTER — Encounter: Admitting: Family Medicine
